# Patient Record
Sex: Male | Born: 1955
Health system: Southern US, Community
[De-identification: ages and names within clinical notes are randomized; demographics above are authoritative.]

## PROBLEM LIST (undated history)

## (undated) ENCOUNTER — Emergency Department: Payer: Medicare HMO

## (undated) DIAGNOSIS — E119 Type 2 diabetes mellitus without complications: Secondary | ICD-10-CM

## (undated) DIAGNOSIS — Z9889 Other specified postprocedural states: Secondary | ICD-10-CM

## (undated) DIAGNOSIS — K219 Gastro-esophageal reflux disease without esophagitis: Secondary | ICD-10-CM

## (undated) DIAGNOSIS — M1990 Unspecified osteoarthritis, unspecified site: Secondary | ICD-10-CM

## (undated) DIAGNOSIS — N179 Acute kidney failure, unspecified: Secondary | ICD-10-CM

## (undated) DIAGNOSIS — I10 Essential (primary) hypertension: Secondary | ICD-10-CM

## (undated) DIAGNOSIS — Z8719 Personal history of other diseases of the digestive system: Secondary | ICD-10-CM

## (undated) DIAGNOSIS — E039 Hypothyroidism, unspecified: Secondary | ICD-10-CM

## (undated) DIAGNOSIS — K5790 Diverticulosis of intestine, part unspecified, without perforation or abscess without bleeding: Secondary | ICD-10-CM

## (undated) DIAGNOSIS — Z794 Long term (current) use of insulin: Secondary | ICD-10-CM

## (undated) DIAGNOSIS — M109 Gout, unspecified: Secondary | ICD-10-CM

## (undated) HISTORY — DX: Unspecified osteoarthritis, unspecified site: M19.90

## (undated) HISTORY — PX: HERNIA REPAIR: SHX51

## (undated) HISTORY — PX: UMBILICAL HERNIA REPAIR: SHX196

---

## 2003-02-19 ENCOUNTER — Other Ambulatory Visit: Payer: Self-pay

## 2004-05-22 ENCOUNTER — Emergency Department: Payer: Self-pay | Admitting: Emergency Medicine

## 2004-06-06 ENCOUNTER — Ambulatory Visit: Payer: Self-pay | Admitting: Urology

## 2004-06-20 ENCOUNTER — Ambulatory Visit: Payer: Self-pay | Admitting: Urology

## 2004-06-21 ENCOUNTER — Ambulatory Visit: Payer: Self-pay | Admitting: Urology

## 2005-07-16 ENCOUNTER — Emergency Department: Payer: Self-pay | Admitting: Emergency Medicine

## 2006-04-29 ENCOUNTER — Emergency Department: Payer: Self-pay | Admitting: Emergency Medicine

## 2007-03-26 ENCOUNTER — Other Ambulatory Visit: Payer: Self-pay

## 2007-03-26 ENCOUNTER — Inpatient Hospital Stay: Payer: Self-pay | Admitting: Internal Medicine

## 2007-09-01 ENCOUNTER — Ambulatory Visit: Payer: Self-pay | Admitting: Internal Medicine

## 2007-10-26 ENCOUNTER — Emergency Department: Payer: Self-pay | Admitting: Emergency Medicine

## 2008-08-04 ENCOUNTER — Emergency Department: Payer: Self-pay | Admitting: Emergency Medicine

## 2009-02-20 ENCOUNTER — Emergency Department: Payer: Self-pay | Admitting: Emergency Medicine

## 2009-05-15 ENCOUNTER — Emergency Department: Payer: Self-pay | Admitting: Emergency Medicine

## 2009-06-22 ENCOUNTER — Emergency Department: Payer: Self-pay | Admitting: Emergency Medicine

## 2009-07-19 ENCOUNTER — Ambulatory Visit: Payer: Self-pay | Admitting: Unknown Physician Specialty

## 2010-07-22 IMAGING — CT CT ABD-PELV W/O CM
1 of 2 series · 15 of 32 positions shown, 19 images · non-contrast
Comparison: 04/29/2006

REASON FOR EXAM: (1) pain and vomiting; (2) pain and vomiting
COMMENTS:   May transport without cardiac monitor

PROCEDURE:     CT  - CT ABDOMEN AND PELVIS W[DATE]  [DATE]
RESULT:     Indication: Pain and vomiting
TECHNIQUE: Multiple axial images from the lung bases to the symphysis pubis
were obtained without oral and without intravenous contrast.

[Series 2: stone · axial · 0.87mm/px · z∈[-532,-73]mm · 15 of 167 slices shown, 19 images]
[im 7/167  soft-tissue]
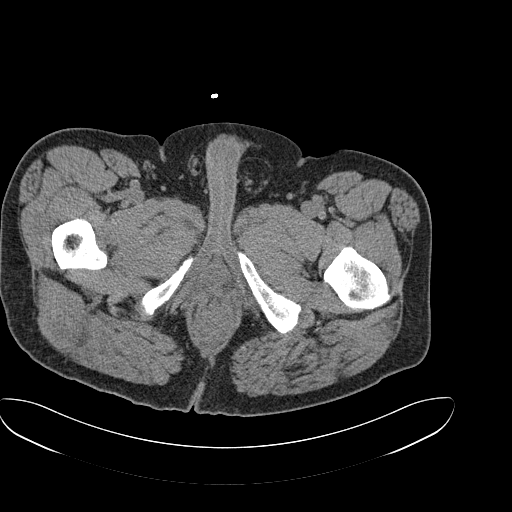
[im 7/167  bone]
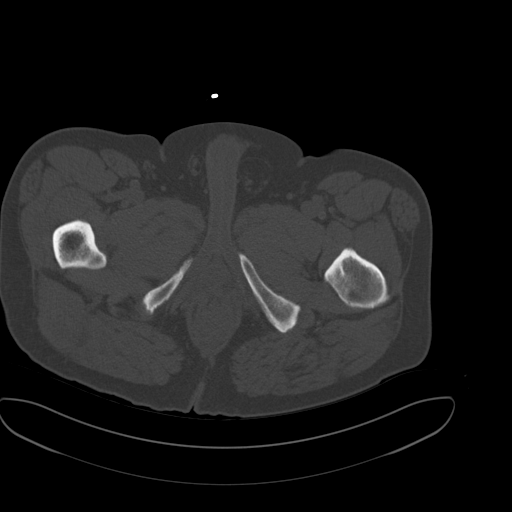
[im 21/167  soft-tissue]
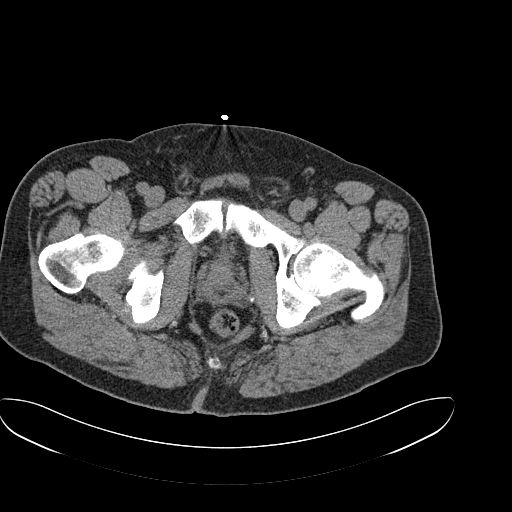
[im 35/167  soft-tissue]
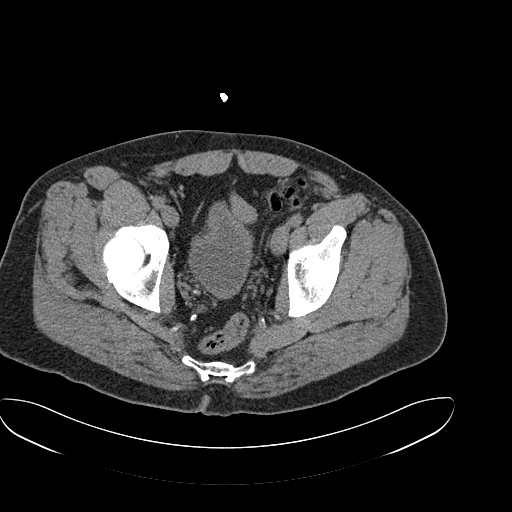
[im 49/167  soft-tissue]
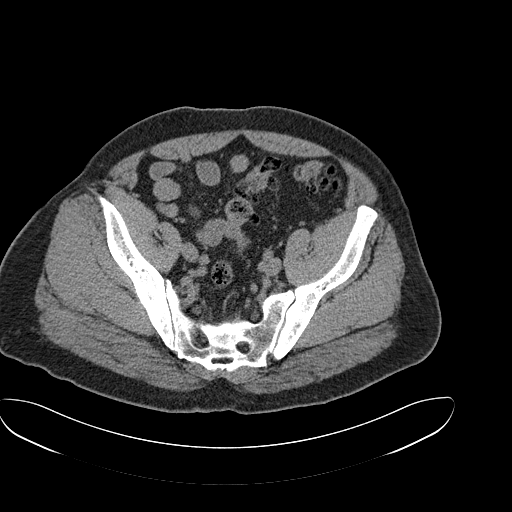
[im 56/167  soft-tissue]
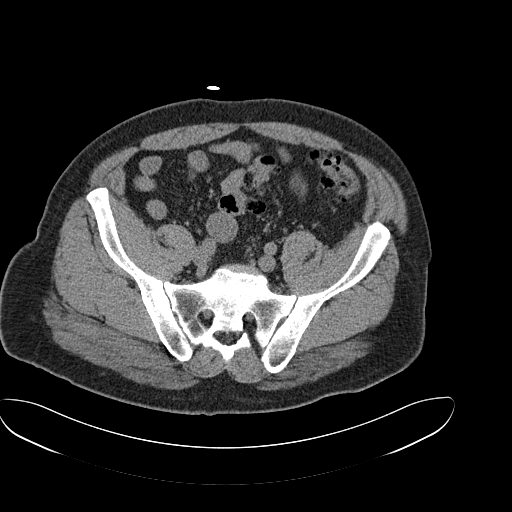
[im 70/167  soft-tissue]
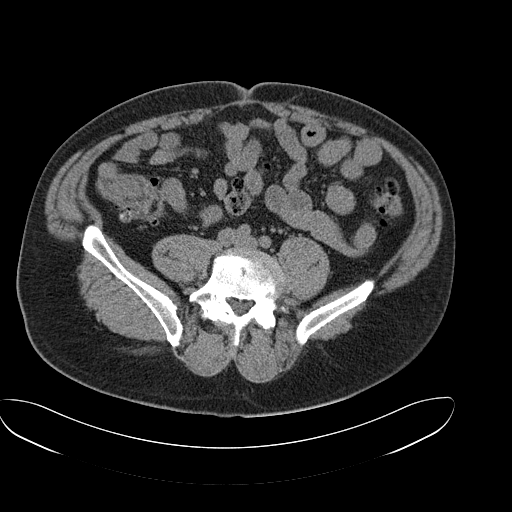
[im 84/167  soft-tissue]
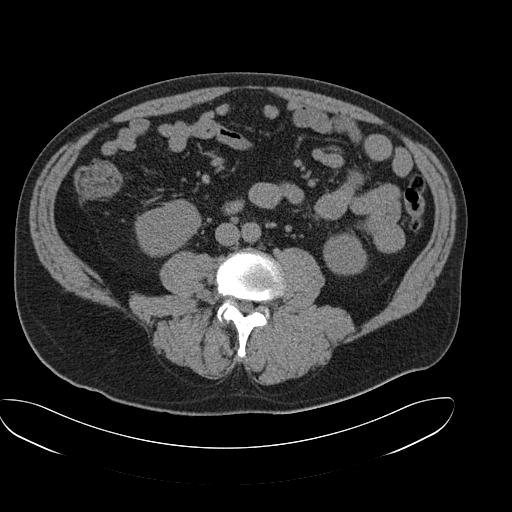
[im 97/167  soft-tissue]
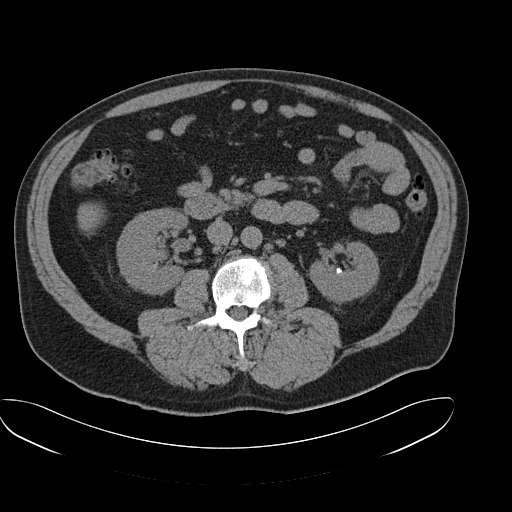
[im 111/167  soft-tissue]
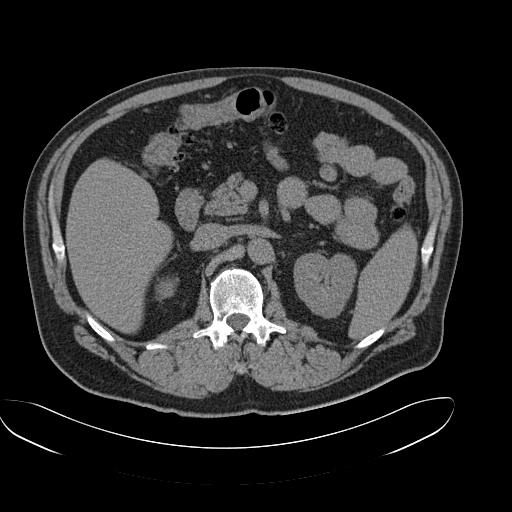
[im 111/167  bone]
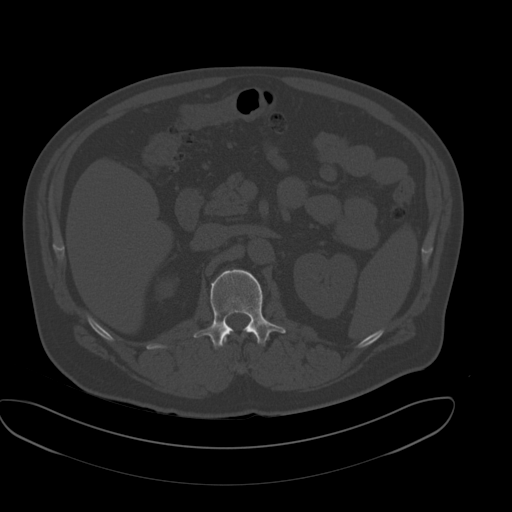
[im 118/167  soft-tissue]
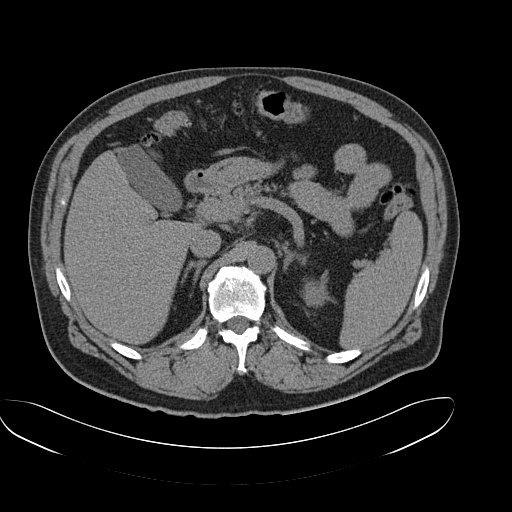
[im 132/167  soft-tissue]
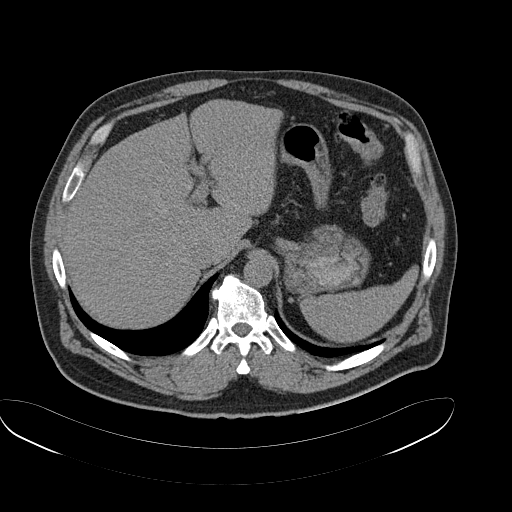
[im 139/167  lung]
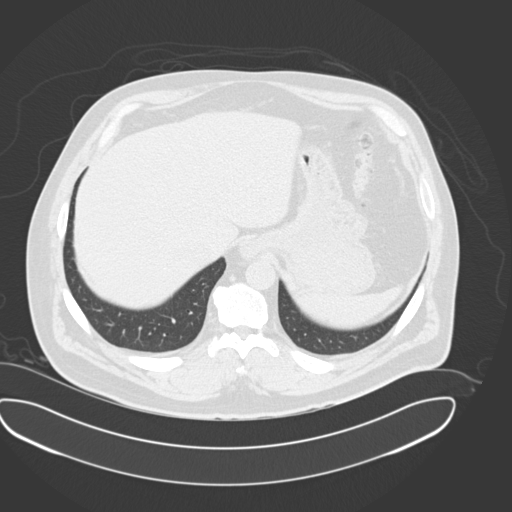
[im 146/167  soft-tissue]
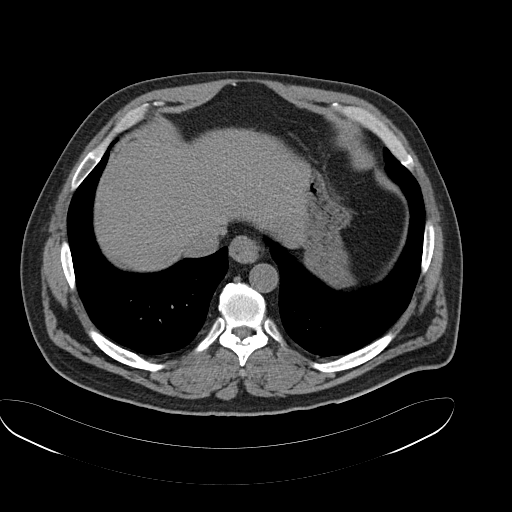
[im 146/167  lung]
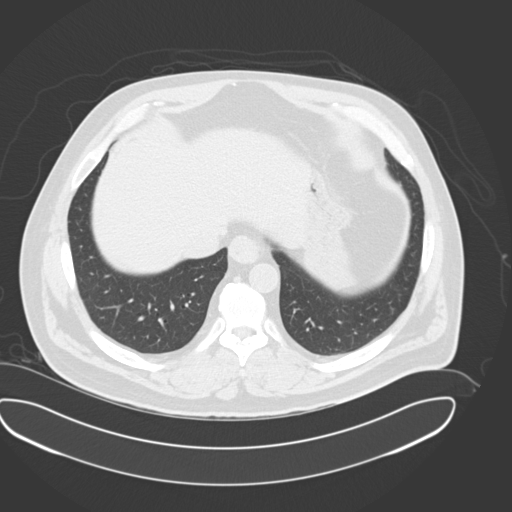
[im 153/167  lung]
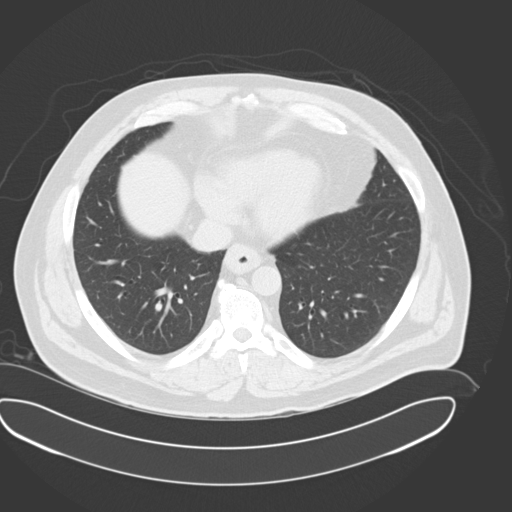
[im 160/167  soft-tissue]
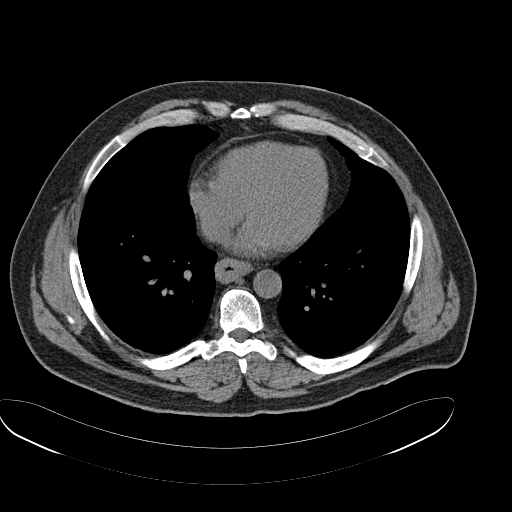
[im 160/167  lung]
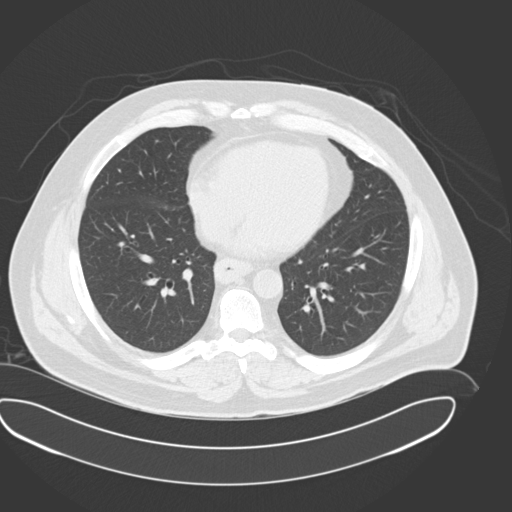

[15 of 32 positions shown; findings below may reference images not displayed]

FINDINGS: The lung bases are clear. There is no pleural or pericardial effusions.

There is a 4 mm nonobstructing left renal calculus. There is a punctate
nonobstructing left renal inferior pole calculus. No obstructive uropathy.
No perinephric stranding is seen. The kidneys are symmetric in size without
evidence for exophytic mass. The bladder is unremarkable.

The liver demonstrates no focal abnormality. The gallbladder is
unremarkable. The spleen demonstrates no focal abnormality. The adrenal
glands and pancreas are normal.

The unopacified stomach, duodenum, small intestine, and large intestine are
unremarkable, but evaluation is limited by lack of oral contrast. A there is
a fat-containing left inguinal hernia. There is diverticulosis without
evidence of diverticulitis. There is a normal caliber appendix in the right
lower quadrant without periappendiceal inflammatory changes. There is no
pneumoperitoneum, pneumatosis, or portal venous gas. There is no abdominal
or pelvic free fluid. There is no lymphadenopathy.

The abdominal aorta is normal in caliber. Incidental note is made of a
circumaortic left renal vein.

There is lumbar spine spondylosis.
IMPRESSION: 1. Nonobstructing left nephrolithiasis.
2. Normal appendix.
3. Diverticulosis without evidence of diverticulitis.

## 2011-01-09 ENCOUNTER — Emergency Department: Payer: Self-pay | Admitting: Emergency Medicine

## 2011-04-29 ENCOUNTER — Ambulatory Visit: Payer: Self-pay | Admitting: Internal Medicine

## 2011-05-06 ENCOUNTER — Ambulatory Visit: Payer: Self-pay | Admitting: Urology

## 2011-05-27 ENCOUNTER — Emergency Department: Payer: Self-pay | Admitting: Emergency Medicine

## 2012-03-13 ENCOUNTER — Ambulatory Visit: Payer: Self-pay | Admitting: Family Medicine

## 2013-09-08 ENCOUNTER — Emergency Department: Payer: Self-pay | Admitting: Emergency Medicine

## 2014-03-21 ENCOUNTER — Emergency Department: Payer: Self-pay | Admitting: Emergency Medicine

## 2014-07-19 ENCOUNTER — Encounter: Payer: Self-pay | Admitting: Urgent Care

## 2014-07-19 ENCOUNTER — Emergency Department
Admission: EM | Admit: 2014-07-19 | Discharge: 2014-07-19 | Disposition: A | Discharge status: Home / Self Care | Payer: BLUE CROSS/BLUE SHIELD | Attending: Emergency Medicine | Admitting: Emergency Medicine

## 2014-07-19 ENCOUNTER — Emergency Department: Payer: BLUE CROSS/BLUE SHIELD

## 2014-07-19 ENCOUNTER — Other Ambulatory Visit: Payer: Self-pay

## 2014-07-19 DIAGNOSIS — E119 Type 2 diabetes mellitus without complications: Secondary | ICD-10-CM | POA: Insufficient documentation

## 2014-07-19 DIAGNOSIS — Z791 Long term (current) use of non-steroidal anti-inflammatories (NSAID): Secondary | ICD-10-CM | POA: Diagnosis not present

## 2014-07-19 DIAGNOSIS — R079 Chest pain, unspecified: Secondary | ICD-10-CM | POA: Diagnosis not present

## 2014-07-19 DIAGNOSIS — Z79899 Other long term (current) drug therapy: Secondary | ICD-10-CM | POA: Insufficient documentation

## 2014-07-19 DIAGNOSIS — I10 Essential (primary) hypertension: Secondary | ICD-10-CM | POA: Diagnosis not present

## 2014-07-19 HISTORY — DX: Essential (primary) hypertension: I10

## 2014-07-19 HISTORY — DX: Type 2 diabetes mellitus without complications: E11.9

## 2014-07-19 LAB — BASIC METABOLIC PANEL
Anion gap: 9 (ref 5–15)
BUN: 18 mg/dL (ref 6–20)
CHLORIDE: 102 mmol/L (ref 101–111)
CO2: 31 mmol/L (ref 22–32)
Calcium: 9.4 mg/dL (ref 8.9–10.3)
Creatinine, Ser: 1.44 mg/dL — ABNORMAL HIGH (ref 0.61–1.24)
GFR calc non Af Amer: 52 mL/min — ABNORMAL LOW (ref >60)
GFR, EST AFRICAN AMERICAN: 60 mL/min — AB (ref >60)
Glucose, Bld: 179 mg/dL — ABNORMAL HIGH (ref 65–99)
POTASSIUM: 4.9 mmol/L (ref 3.5–5.1)
Sodium: 142 mmol/L (ref 135–145)

## 2014-07-19 LAB — CBC
HEMATOCRIT: 47 % (ref 40.0–52.0)
HEMOGLOBIN: 15.8 g/dL (ref 13.0–18.0)
MCH: 29.5 pg (ref 26.0–34.0)
MCHC: 33.6 g/dL (ref 32.0–36.0)
MCV: 87.7 fL (ref 80.0–100.0)
Platelets: 159 10*3/uL (ref 150–440)
RBC: 5.36 MIL/uL (ref 4.40–5.90)
RDW: 13.9 % (ref 11.5–14.5)
WBC: 7 10*3/uL (ref 3.8–10.6)

## 2014-07-19 LAB — TROPONIN I: Troponin I: <0.03 ng/mL (ref <0.031)

## 2014-07-19 LAB — FIBRIN DERIVATIVES D-DIMER (ARMC ONLY): Fibrin derivatives D-dimer (ARMC): 341 (ref 0–499)

## 2014-07-19 MED ORDER — NAPROXEN 500 MG PO TABS
ORAL_TABLET | ORAL | Status: AC
  Administered 2014-07-19: 250 mg via ORAL
  Filled 2014-07-19: qty 1

## 2014-07-19 MED ORDER — NAPROXEN 500 MG PO TABS
250.0000 mg | ORAL_TABLET | Freq: Once | ORAL | Status: AC
  Administered 2014-07-19: 250 mg via ORAL

## 2014-07-19 NOTE — Discharge Instructions (Signed)

## 2014-07-19 NOTE — ED Notes (Signed)
Patient presents with c/o LEFT sided CP with (+) SOB. Pain is exacerbated by deep inspiration and movement. Symptoms persistent since yesterday. Denies known injury.

## 2014-07-19 NOTE — ED Provider Notes (Signed)
[facility] Emergency Department Provider Note  ____________________________________________  Time seen: Approximately 850 AM  I have reviewed the triage vital signs and the nursing notes.   HISTORY  Chief Complaint Chest Pain    HPI Nathan Wong is a 59 y.o. male with a history of hypertension and diabetes who presents today with 1 day of chest pain. He says that he does a lot of lifting and bending at work and it started about an hour after he was doing some stretching and lifting of his upper extremities. The pain is a 4 out of 10 right now and has been intermittent over the past day. Cramping tightness in quality. He says that bending and moving upper extremities makes it worse. It is not associated with shortness of breath but hurts to take a deep breath.Says any diaphoresis, nausea vomiting. Does not worsen with exertion. Has not taken any pain relief medications for this. Pain is to the left lower part of the chest and nonradiating.   Past Medical History  Diagnosis Date  . Hypertension   . Diabetes mellitus without complication     There are no active problems to display for this patient.   History reviewed. No pertinent past surgical history.  Current Outpatient Rx  Name  Route  Sig  Dispense  Refill  . gabapentin (NEURONTIN) 300 MG capsule   Oral   Take 300 mg by mouth 3 (three) times daily.         Marland Kitchen glyBURIDE micronized (GLYNASE) 3 MG tablet   Oral   Take 3 mg by mouth daily with breakfast.       2   . indomethacin (INDOCIN) 25 MG capsule   Oral   Take 25 mg by mouth 2 (two) times daily with a meal.       1   . lisinopril (PRINIVIL,ZESTRIL) 5 MG tablet   Oral   Take 5 mg by mouth daily.       4   . metFORMIN (GLUCOPHAGE) 500 MG tablet   Oral   Take 1,000 mg by mouth daily with breakfast.       2     Allergies Review of patient's allergies indicates no known allergies.  No family history on file.  Social  History History  Substance Use Topics  . Smoking status: Never Smoker   . Smokeless tobacco: Not on file  . Alcohol Use: No    Review of Systems Constitutional: No fever/chills Eyes: No visual changes. ENT: No sore throat. Cardiovascular: As above Respiratory: Denies shortness of breath. Gastrointestinal: No abdominal pain.  No nausea, no vomiting.  No diarrhea.  No constipation. Genitourinary: Negative for dysuria. Musculoskeletal: Negative for back pain. Skin: Negative for rash. Neurological: Negative for headaches, focal weakness or numbness.  10-point ROS otherwise negative.  ____________________________________________   PHYSICAL EXAM:  VITAL SIGNS: ED Triage Vitals  Enc Vitals Group     BP 07/19/14 0704 133/79 mmHg     Pulse Rate 07/19/14 0704 74     Resp 07/19/14 0704 18     Temp 07/19/14 0704 98.1 F (36.7 C)     Temp Source 07/19/14 0704 Oral     SpO2 07/19/14 0704 97 %     Weight --      Height --      Head Cir --      Peak Flow --      Pain Score 07/19/14 0702 4     Pain Loc --  Pain Edu? --      Excl. in GC? --     Constitutional: Alert and oriented. Well appearing and in no acute distress. Eyes: Conjunctivae are normal. PERRL. EOMI. Head: Atraumatic. Nose: No congestion/rhinnorhea. Mouth/Throat: Mucous membranes are moist.  Oropharynx non-erythematous. Neck: No stridor.   Cardiovascular: Normal rate, regular rhythm. Grossly normal heart sounds.  Good peripheral circulation. Chest pain reproducible the left lower part of the chest over ribs 9 through 11 both anteriorly and laterally. No left upper quadrant pain to the abdomen. No Cullins or Ven[place] Lick signs. Respiratory: Normal respiratory effort.  No retractions. Lungs CTAB. Gastrointestinal: Soft and nontender. No distention. No abdominal bruits. No CVA tenderness. Musculoskeletal: No lower extremity tenderness nor edema.  No joint effusions. Neurologic:  Normal speech and language. No gross  focal neurologic deficits are appreciated. Speech is normal. No gait instability. Skin:  Skin is warm, dry and intact. No rash noted. Psychiatric: Mood and affect are normal. Speech and behavior are normal.  ____________________________________________   LABS (all labs ordered are listed, but only abnormal results are displayed)  Labs Reviewed  BASIC METABOLIC PANEL - Abnormal; Notable for the following:    Glucose, Bld 179 (*)    Creatinine, Ser 1.44 (*)    GFR calc non Af Amer 52 (*)    GFR calc Af Amer 60 (*)    All other components within normal limits  CBC  TROPONIN I  FIBRIN DERIVATIVES D-DIMER ([facility] ONLY)   ____________________________________________  EKG  ED ECG REPORT I, Arelia Longest, the attending physician, personally viewed and interpreted this ECG.   Date: 07/19/2014  EKG Time: 706  Rate: 75  Rhythm: normal EKG, normal sinus rhythm  Axis: Normal axis  Intervals:none  ST&T Change: No ST elevations or depressions. No abnormal T-wave inversions  ____________________________________________  RADIOLOGY  Chest x-ray NAD. ____________________________________________   PROCEDURES FAST BEDSIDE US Indication: Rib pain to the left lower part of the thorax  3 Views obtained: Splenorenal, Morrison's Pouch, Retrovesical No free fluid in abdomen.  Spleen visualized and appears intact. No difficulty obtaining views. I personally performed and interrepreted the images   ____________________________________________   INITIAL IMPRESSION / ASSESSMENT AND PLAN / ED COURSE  Pertinent labs & imaging results that were available during my care of the patient were reviewed by me and considered in my medical decision making (see chart for details).  ----------------------------------------- 10:39 AM on 07/19/2014 -----------------------------------------  Pain improved after Naprosyn. Likely chest wall pain associated with work. Labs, imaging and EKG are all  reassuring. We'll discharge to home. will continue with symptomatically treatment of Naprosyn and muscle cream such as Aspercreme or acting. ____________________________________________   FINAL CLINICAL IMPRESSION(S) / ED DIAGNOSES  Acute chest pain. Initial visit.      Myrna Blazer, MD 07/19/14 1040

## 2014-07-19 NOTE — ED Notes (Signed)
MD at bedside. 

## 2015-06-22 ENCOUNTER — Other Ambulatory Visit: Payer: Self-pay | Admitting: Internal Medicine

## 2015-06-22 DIAGNOSIS — G459 Transient cerebral ischemic attack, unspecified: Secondary | ICD-10-CM

## 2015-06-22 DIAGNOSIS — H547 Unspecified visual loss: Secondary | ICD-10-CM

## 2015-07-13 ENCOUNTER — Ambulatory Visit: Payer: BLUE CROSS/BLUE SHIELD

## 2016-01-04 ENCOUNTER — Emergency Department: Payer: Self-pay

## 2016-01-04 ENCOUNTER — Observation Stay
Admit: 2016-01-04 | Discharge: 2016-01-04 | Disposition: A | Discharge status: Home / Self Care | Payer: Self-pay | Attending: Cardiovascular Disease | Admitting: Cardiovascular Disease

## 2016-01-04 ENCOUNTER — Observation Stay
Admission: EM | Admit: 2016-01-04 | Discharge: 2016-01-05 | Disposition: A | Discharge status: Home / Self Care | Payer: Self-pay | Attending: Internal Medicine | Admitting: Internal Medicine

## 2016-01-04 DIAGNOSIS — R0781 Pleurodynia: Principal | ICD-10-CM | POA: Insufficient documentation

## 2016-01-04 DIAGNOSIS — Z79899 Other long term (current) drug therapy: Secondary | ICD-10-CM | POA: Insufficient documentation

## 2016-01-04 DIAGNOSIS — E1122 Type 2 diabetes mellitus with diabetic chronic kidney disease: Secondary | ICD-10-CM | POA: Insufficient documentation

## 2016-01-04 DIAGNOSIS — I313 Pericardial effusion (noninflammatory): Secondary | ICD-10-CM | POA: Insufficient documentation

## 2016-01-04 DIAGNOSIS — R1011 Right upper quadrant pain: Secondary | ICD-10-CM

## 2016-01-04 DIAGNOSIS — I129 Hypertensive chronic kidney disease with stage 1 through stage 4 chronic kidney disease, or unspecified chronic kidney disease: Secondary | ICD-10-CM | POA: Insufficient documentation

## 2016-01-04 DIAGNOSIS — N183 Chronic kidney disease, stage 3 (moderate): Secondary | ICD-10-CM | POA: Insufficient documentation

## 2016-01-04 DIAGNOSIS — E1140 Type 2 diabetes mellitus with diabetic neuropathy, unspecified: Secondary | ICD-10-CM | POA: Insufficient documentation

## 2016-01-04 DIAGNOSIS — Q248 Other specified congenital malformations of heart: Secondary | ICD-10-CM

## 2016-01-04 DIAGNOSIS — Z7984 Long term (current) use of oral hypoglycemic drugs: Secondary | ICD-10-CM | POA: Insufficient documentation

## 2016-01-04 HISTORY — DX: Other specified postprocedural states: Z98.890

## 2016-01-04 HISTORY — DX: Personal history of other diseases of the digestive system: Z87.19

## 2016-01-04 LAB — CBC
HEMATOCRIT: 43.1 % (ref 40.0–52.0)
Hemoglobin: 15.2 g/dL (ref 13.0–18.0)
MCH: 29.9 pg (ref 26.0–34.0)
MCHC: 35.3 g/dL (ref 32.0–36.0)
MCV: 84.8 fL (ref 80.0–100.0)
PLATELETS: 159 10*3/uL (ref 150–440)
RBC: 5.08 MIL/uL (ref 4.40–5.90)
RDW: 14.2 % (ref 11.5–14.5)
WBC: 12.9 10*3/uL — AB (ref 3.8–10.6)

## 2016-01-04 LAB — TROPONIN I: Troponin I: <0.03 ng/mL (ref <0.03)

## 2016-01-04 LAB — FIBRIN DERIVATIVES D-DIMER (ARMC ONLY): FIBRIN DERIVATIVES D-DIMER (ARMC): 350 (ref 0–499)

## 2016-01-04 LAB — BASIC METABOLIC PANEL
Anion gap: 10 (ref 5–15)
BUN: 14 mg/dL (ref 6–20)
CALCIUM: 8.9 mg/dL (ref 8.9–10.3)
CO2: 25 mmol/L (ref 22–32)
CREATININE: 1.33 mg/dL — AB (ref 0.61–1.24)
Chloride: 100 mmol/L — ABNORMAL LOW (ref 101–111)
GFR calc Af Amer: >60 mL/min (ref >60)
GFR, EST NON AFRICAN AMERICAN: 56 mL/min — AB (ref >60)
Glucose, Bld: 284 mg/dL — ABNORMAL HIGH (ref 65–99)
Potassium: 4.2 mmol/L (ref 3.5–5.1)
SODIUM: 135 mmol/L (ref 135–145)

## 2016-01-04 LAB — TSH: TSH: 8.297 u[IU]/mL — ABNORMAL HIGH (ref 0.350–4.500)

## 2016-01-04 LAB — GLUCOSE, CAPILLARY
Glucose-Capillary: 213 mg/dL — ABNORMAL HIGH (ref 65–99)
Glucose-Capillary: 196 mg/dL — ABNORMAL HIGH (ref 65–99)
Glucose-Capillary: 170 mg/dL — ABNORMAL HIGH (ref 65–99)
Glucose-Capillary: 233 mg/dL — ABNORMAL HIGH (ref 65–99)

## 2016-01-04 MED ORDER — MORPHINE SULFATE (PF) 4 MG/ML IV SOLN
2.0000 mg | Freq: Once | INTRAVENOUS | Status: AC
  Administered 2016-01-04: 2 mg via INTRAVENOUS

## 2016-01-04 MED ORDER — ACETAMINOPHEN 650 MG RE SUPP: 650.0000 mg | Freq: Four times a day (QID) | RECTAL | Status: DC | PRN

## 2016-01-04 MED ORDER — GABAPENTIN 300 MG PO CAPS
300.0000 mg | ORAL_CAPSULE | Freq: Every day | ORAL | Status: DC
  Administered 2016-01-05: 300 mg via ORAL
  Filled 2016-01-04: qty 1

## 2016-01-04 MED ORDER — GI COCKTAIL ~~LOC~~
30.0000 mL | Freq: Once | ORAL | Status: AC
  Administered 2016-01-04: 30 mL via ORAL

## 2016-01-04 MED ORDER — TRAMADOL HCL 50 MG PO TABS
50.0000 mg | ORAL_TABLET | Freq: Four times a day (QID) | ORAL | Status: DC | PRN
Start: 2016-01-04 — End: 2016-01-05
  Administered 2016-01-04: 50 mg via ORAL
  Filled 2016-01-04: qty 1

## 2016-01-04 MED ORDER — LISINOPRIL 5 MG PO TABS
5.0000 mg | ORAL_TABLET | Freq: Every day | ORAL | Status: DC
  Administered 2016-01-04 – 2016-01-05 (×2): 5 mg via ORAL
  Filled 2016-01-04 (×2): qty 1

## 2016-01-04 MED ORDER — ACETAMINOPHEN 325 MG PO TABS
650.0000 mg | ORAL_TABLET | Freq: Four times a day (QID) | ORAL | Status: DC | PRN
  Filled 2016-01-04: qty 2

## 2016-01-04 MED ORDER — INSULIN ASPART 100 UNIT/ML ~~LOC~~ SOLN
0.0000 [IU] | Freq: Three times a day (TID) | SUBCUTANEOUS | Status: DC
  Administered 2016-01-04 – 2016-01-05 (×3): 2 [IU] via SUBCUTANEOUS
  Filled 2016-01-04 (×3): qty 2

## 2016-01-04 MED ORDER — DOCUSATE SODIUM 100 MG PO CAPS
100.0000 mg | ORAL_CAPSULE | Freq: Two times a day (BID) | ORAL | Status: DC
  Administered 2016-01-04 – 2016-01-05 (×2): 100 mg via ORAL
  Filled 2016-01-04 (×2): qty 1

## 2016-01-04 MED ORDER — ONDANSETRON HCL 4 MG/2ML IJ SOLN
INTRAMUSCULAR | Status: AC
  Administered 2016-01-04: 4 mg via INTRAVENOUS
  Filled 2016-01-04: qty 2

## 2016-01-04 MED ORDER — MORPHINE SULFATE (PF) 2 MG/ML IV SOLN
INTRAVENOUS | Status: AC
  Filled 2016-01-04: qty 1

## 2016-01-04 MED ORDER — GI COCKTAIL ~~LOC~~
ORAL | Status: AC
  Filled 2016-01-04: qty 30

## 2016-01-04 MED ORDER — MORPHINE SULFATE (PF) 4 MG/ML IV SOLN: 2.0000 mg | Freq: Once | INTRAVENOUS | Status: DC

## 2016-01-04 MED ORDER — ENOXAPARIN SODIUM 40 MG/0.4ML ~~LOC~~ SOLN
40.0000 mg | SUBCUTANEOUS | Status: DC
  Administered 2016-01-04: 40 mg via SUBCUTANEOUS
  Filled 2016-01-04: qty 0.4

## 2016-01-04 MED ORDER — METFORMIN HCL 500 MG PO TABS
1000.0000 mg | ORAL_TABLET | Freq: Every day | ORAL | Status: DC
  Filled 2016-01-04: qty 2

## 2016-01-04 MED ORDER — INSULIN ASPART 100 UNIT/ML ~~LOC~~ SOLN
0.0000 [IU] | Freq: Every day | SUBCUTANEOUS | Status: DC
  Administered 2016-01-04: 2 [IU] via SUBCUTANEOUS
  Filled 2016-01-04: qty 2

## 2016-01-04 MED ORDER — ASPIRIN 81 MG PO CHEW
CHEWABLE_TABLET | ORAL | Status: AC
  Administered 2016-01-04: 324 mg via ORAL
  Filled 2016-01-04: qty 4

## 2016-01-04 MED ORDER — ONDANSETRON HCL 4 MG/2ML IJ SOLN: 4.0000 mg | Freq: Four times a day (QID) | INTRAMUSCULAR | Status: DC | PRN

## 2016-01-04 MED ORDER — IBUPROFEN 400 MG PO TABS: 400.0000 mg | ORAL_TABLET | Freq: Four times a day (QID) | ORAL | Status: DC | PRN

## 2016-01-04 MED ORDER — SODIUM CHLORIDE 0.9 % IV SOLN
INTRAVENOUS | Status: DC
  Administered 2016-01-04: 10:00:00 via INTRAVENOUS

## 2016-01-04 MED ORDER — SODIUM CHLORIDE 0.9% FLUSH
3.0000 mL | Freq: Two times a day (BID) | INTRAVENOUS | Status: DC
Start: 2016-01-04 — End: 2016-01-05
  Administered 2016-01-04 – 2016-01-05 (×3): 3 mL via INTRAVENOUS

## 2016-01-04 MED ORDER — GABAPENTIN 300 MG PO CAPS
300.0000 mg | ORAL_CAPSULE | Freq: Three times a day (TID) | ORAL | Status: DC
  Administered 2016-01-04: 300 mg via ORAL
  Filled 2016-01-04: qty 1

## 2016-01-04 MED ORDER — ONDANSETRON HCL 4 MG PO TABS: 4.0000 mg | ORAL_TABLET | Freq: Four times a day (QID) | ORAL | Status: DC | PRN

## 2016-01-04 MED ORDER — ONDANSETRON HCL 4 MG/2ML IJ SOLN
4.0000 mg | Freq: Once | INTRAMUSCULAR | Status: AC
  Administered 2016-01-04: 4 mg via INTRAVENOUS

## 2016-01-04 MED ORDER — IOPAMIDOL (ISOVUE-370) INJECTION 76%
75.0000 mL | Freq: Once | INTRAVENOUS | Status: AC | PRN
  Administered 2016-01-04: 75 mL via INTRAVENOUS

## 2016-01-04 MED ORDER — ASPIRIN 81 MG PO CHEW
324.0000 mg | CHEWABLE_TABLET | Freq: Once | ORAL | Status: AC
  Administered 2016-01-04: 324 mg via ORAL

## 2016-01-04 NOTE — Progress Notes (Signed)
Patient is alert and oriented, vss, having some intermittent chest pain that is relieved with tramadol.    Awaiting echo .

## 2016-01-04 NOTE — Progress Notes (Signed)
*  PRELIMINARY RESULTS* Echocardiogram 2D Echocardiogram has been performed.  Cristela BlueHege, Safiyya Stokes 01/04/2016, 5:03 PM

## 2016-01-04 NOTE — H&P (Signed)
Nathan Wong is an 60 y.o. male.   Chief Complaint: Chest pain HPI: The patient with past medical history of diabetes and hypertension presents to the emergency department complaining of chest pain. The patient states that began yesterday afternoon and is positional. Leaning forward or laying flat hurts worse than sitting upright. He denies shortness of breath, nausea, vomiting or diaphoresis. In the emergency department he was found to be tachycardic but in no apparent distress. Initial cardiac enzymes were negative. CT of the chest revealed a pericardial cyst. Due to his ongoing chest pain and the potential rapid growth of the cyst emergency department staff called the hospitalist service for further evaluation.  Past Medical History:  Diagnosis Date  . Diabetes mellitus without complication ([place])   . Hypertension     Past Surgical History:  Procedure Laterality Date  . HERNIA REPAIR     abdominal    Family History  Problem Relation Age of Onset  . CAD Mother    Social History:  reports that he has never smoked. He does not have any smokeless tobacco history on file. He reports that he does not drink alcohol or use drugs.  Allergies: No Known Allergies  Prior to Admission medications   Medication Sig Start Date End Date Taking? Authorizing Provider  gabapentin (NEURONTIN) 300 MG capsule Take 300 mg by mouth 3 (three) times daily.    Historical Provider, MD  glyBURIDE micronized (GLYNASE) 3 MG tablet Take 3 mg by mouth daily with breakfast.  06/15/14   Historical Provider, MD  indomethacin (INDOCIN) 25 MG capsule Take 25 mg by mouth 2 (two) times daily with a meal.  07/08/14   Historical Provider, MD  lisinopril (PRINIVIL,ZESTRIL) 5 MG tablet Take 5 mg by mouth daily.  07/08/14   Historical Provider, MD  metFORMIN (GLUCOPHAGE) 500 MG tablet Take 1,000 mg by mouth daily with breakfast.  06/24/14   Historical Provider, MD     Results for orders placed or performed during the hospital  encounter of 01/04/16 (from the past 48 hour(s))  Basic metabolic panel     Status: Abnormal   Collection Time: 01/04/16  2:40 AM  Result Value Ref Range   Sodium 135 135 - 145 mmol/L   Potassium 4.2 3.5 - 5.1 mmol/L   Chloride 100 (L) 101 - 111 mmol/L   CO2 25 22 - 32 mmol/L   Glucose, Bld 284 (H) 65 - 99 mg/dL   BUN 14 6 - 20 mg/dL   Creatinine, Ser 1.33 (H) 0.61 - 1.24 mg/dL   Calcium 8.9 8.9 - 10.3 mg/dL   GFR calc non Af Amer 56 (L) >60 mL/min   GFR calc Af Amer >60 >60 mL/min    Comment: (NOTE) The eGFR has been calculated using the CKD EPI equation. This calculation has not been validated in all clinical situations. eGFR's persistently <60 mL/min signify possible Chronic Kidney Disease.    Anion gap 10 5 - 15  CBC     Status: Abnormal   Collection Time: 01/04/16  2:40 AM  Result Value Ref Range   WBC 12.9 (H) 3.8 - 10.6 K/uL   RBC 5.08 4.40 - 5.90 MIL/uL   Hemoglobin 15.2 13.0 - 18.0 g/dL   HCT 43.1 40.0 - 52.0 %   MCV 84.8 80.0 - 100.0 fL   MCH 29.9 26.0 - 34.0 pg   MCHC 35.3 32.0 - 36.0 g/dL   RDW 14.2 11.5 - 14.5 %   Platelets 159 150 - 440  K/uL  Troponin I     Status: None   Collection Time: 01/04/16  2:40 AM  Result Value Ref Range   Troponin I <0.03 <0.03 ng/mL  Fibrin derivatives D-Dimer     Status: None   Collection Time: 01/04/16  2:43 AM  Result Value Ref Range   Fibrin derivatives D-dimer ([facility]) 350 0 - 499    Comment: <> Exclusion of Venous Thromboembolism (VTE) - OUTPATIENTS ONLY        (Emergency Department or [place])             0-499 ng/ml (FEU)  : With a low to intermediate pretest                                        probability for VTE this test result                                        excludes the diagnosis of VTE.           > 499 ng/ml (FEU)  : VTE not excluded.  Additional work up                                   for VTE is required.   <>  Testing on Inpatients and Evaluation of Disseminated Intravascular        Coagulation  ([facility])             Reference Range:   0-499 ng/ml (FEU)    Ct Angio Chest Pe W And/or Wo Contrast  Result Date: 01/04/2016 CLINICAL DATA:  Acute onset of midsternal chest pain and shortness of breath. Initial encounter. EXAM: CT ANGIOGRAPHY CHEST WITH CONTRAST TECHNIQUE: Multidetector CT imaging of the chest was performed using the standard protocol during bolus administration of intravenous contrast. Multiplanar CT image reconstructions and MIPs were obtained to evaluate the vascular anatomy. CONTRAST:  75 mL of Isovue 370 IV contrast COMPARISON:  Chest radiograph performed earlier today at 2:51 a.m. FINDINGS: Cardiovascular:  There is no evidence of pulmonary embolus. The heart remains normal in size. The thoracic aorta is grossly unremarkable. No significant calcific atherosclerotic disease is seen. The great vessels are unremarkable. Mediastinum/Nodes: The mediastinum is unremarkable in appearance, aside from diffuse wall thickening along the mid to distal esophagus, concerning for acute esophagitis. No mediastinal lymphadenopathy is seen. Trace pericardial fluid is likely within normal limits. An apparent 5.3 x 1.6 cm pericardial cyst is noted on the right side. Lungs/Pleura: Patchy bibasilar atelectasis is noted. Mild interstitial prominence is seen. No pleural effusion or pneumothorax is identified. No masses are identified. Upper Abdomen: The visualized portions the liver and spleen are grossly unremarkable. The visualized portions of the gallbladder, pancreas and adrenal glands are within normal limits. Musculoskeletal: No acute osseous abnormalities are identified. The visualized musculature is unremarkable in appearance. Review of the MIP images confirms the above findings. IMPRESSION: 1. No evidence of pulmonary embolus. 2. Diffuse wall thickening along the mid to distal esophagus, concerning for acute esophagitis. 3. Apparent 5.3 x 1.6 cm pericardial cyst noted at the right side of the  mediastinum. 4. Patchy bibasilar atelectasis, with mild interstitial prominence. Lungs otherwise grossly clear. Electronically Signed   By: Garald Balding  M.D.   On: 01/04/2016 04:31   Dg Chest Portable 1 View  Result Date: 01/04/2016 CLINICAL DATA:  Acute onset of midsternal chest pain and shortness of breath. Initial encounter. EXAM: PORTABLE CHEST 1 VIEW COMPARISON:  Chest radiograph performed 07/19/2014 FINDINGS: The lungs are well-aerated. Mild bibasilar atelectasis is noted. There is no evidence of pleural effusion or pneumothorax. The cardiomediastinal silhouette is borderline normal in size. No acute osseous abnormalities are seen. IMPRESSION: Mild bibasilar atelectasis noted.  Lungs otherwise grossly clear. Electronically Signed   By: Garald Balding M.D.   On: 01/04/2016 03:05   US Abdomen Limited Ruq  Result Date: 01/04/2016 CLINICAL DATA:  Acute onset of right upper quadrant abdominal pain. Initial encounter. EXAM: US ABDOMEN LIMITED - RIGHT UPPER QUADRANT COMPARISON:  CT of the abdomen and pelvis from 04/29/2011 FINDINGS: Gallbladder: No gallstones or wall thickening visualized. No sonographic Murphy sign noted by sonographer. Common bile duct: Diameter: 0.4 cm, within normal limits in caliber. Liver: No focal lesion identified. Mildly increased parenchymal echogenicity raises concern for fatty infiltration. IMPRESSION: 1. No acute abnormality at the right upper quadrant. 2. Mild fatty infiltration within the liver. Electronically Signed   By: Garald Balding M.D.   On: 01/04/2016 03:52    Review of Systems  Constitutional: Negative for chills and fever.  HENT: Negative for sore throat and tinnitus.   Eyes: Negative for blurred vision and redness.  Respiratory: Negative for cough and shortness of breath.   Cardiovascular: Positive for chest pain. Negative for palpitations, orthopnea and PND.  Gastrointestinal: Negative for abdominal pain, diarrhea, nausea and vomiting.  Genitourinary:  Negative for dysuria, frequency and urgency.  Musculoskeletal: Negative for joint pain and myalgias.  Skin: Negative for rash.       No lesions  Neurological: Negative for speech change, focal weakness and weakness.  Endo/Heme/Allergies: Does not bruise/bleed easily.       No temperature intolerance  Psychiatric/Behavioral: Negative for depression and suicidal ideas.    Blood pressure 99/70, pulse (!) 116, temperature 98.2 F (36.8 C), temperature source Oral, resp. rate 16, height 6' (1.829 m), weight 106.6 kg (235 lb), SpO2 93 %. Physical Exam  Constitutional: He is oriented to person, place, and time. He appears well-developed and well-nourished. No distress.  HENT:  Head: Normocephalic and atraumatic.  Mouth/Throat: Oropharynx is clear and moist.  Eyes: Conjunctivae and EOM are normal. Pupils are equal, round, and reactive to light. No scleral icterus.  Neck: Normal range of motion. Neck supple. No JVD present. No tracheal deviation present. No thyromegaly present.  Cardiovascular: Normal rate, regular rhythm and normal heart sounds.  Exam reveals no gallop and no friction rub.   No murmur heard. Respiratory: Effort normal and breath sounds normal. No respiratory distress.  GI: Soft. Bowel sounds are normal. He exhibits no distension. There is no tenderness.  Genitourinary:  Genitourinary Comments: Deferred  Musculoskeletal: Normal range of motion. He exhibits no edema.  Lymphadenopathy:    He has no cervical adenopathy.  Neurological: He is alert and oriented to person, place, and time. No cranial nerve deficit.  Skin: Skin is warm and dry. No rash noted. No erythema.  Psychiatric: He has a normal mood and affect. His behavior is normal. Judgment and thought content normal.     Assessment/Plan This is a 60 year old male admitted for pericardial cyst. 1. Pericardial cyst: Etiology of positional chest pain. The patient does not have any indication of acute coronary syndrome.  Further, he does have risk factors for  heart disease. I will order a cardiology consult as well as an echocardiogram. He will not need a stress test at this time. 2. Diabetes mellitus type 2: Hold glyburide; may continue metformin for this short hospital stay as we do not anticipate any studies requiring contrast. I have added sliding scale insulin while the patient is hospitalized. 3. Hypertension: Controlled; continue lisinopril 4. CKD: Stage III; creatinine clearance is actually improved since previous laboratory data. May continue ACE inhibitor however I have discontinued the patient's indomethacin. 5. Neuropathy: Secondary to diabetes. Continue Neurontin 6. DVT prophylaxis: Lovenox 7. GI prophylaxis: None The patient is a full code. Time spent on admission was inpatient care approximately 45 minutes  Harrie Foreman, MD 01/04/2016, 6:31 AM

## 2016-01-04 NOTE — ED Provider Notes (Signed)
[facility] Emergency Department Provider Note   First MD Initiated Contact with Patient 01/04/16 [phone]     (approximate)  I have reviewed the triage vital signs and the nursing notes.   HISTORY  Chief Complaint Chest Pain   HPI Dominic PeaJames K Dauphinais is a 60 y.o. male with history of hypertension and diabetes presents to the emergency department with persistent 8 out of 10 central chest pain that is nonradiating with onset earlier this evening. Patient denies any dyspnea but does admit to nausea. Patient denies any diaphoresis. Patient denies any lower shoulder pain or swelling. Patient denies any dizziness or palpitations. Patient states that the pain is worse with laying flat and improved with sitting forward.   Past Medical History:  Diagnosis Date  . Diabetes mellitus without complication (HCC)   . H/O hernia repair   . Hypertension     Patient Active Problem List   Diagnosis Date Noted  . Pericardial cyst 01/04/2016    Past surgical history None  Prior to Admission medications   Medication Sig Start Date End Date Taking? Authorizing Provider  gabapentin (NEURONTIN) 300 MG capsule Take 300 mg by mouth daily.    Yes Historical Provider, MD  glyBURIDE micronized (GLYNASE) 3 MG tablet Take 3 mg by mouth daily with breakfast.  06/15/14  Yes Historical Provider, MD  lisinopril (PRINIVIL,ZESTRIL) 5 MG tablet Take 5 mg by mouth daily.  07/08/14  Yes Historical Provider, MD  metFORMIN (GLUCOPHAGE) 500 MG tablet Take 1,000 mg by mouth daily with breakfast.  06/24/14  Yes Historical Provider, MD  indomethacin (INDOCIN) 25 MG capsule Take 1 capsule (25 mg total) by mouth 3 (three) times daily with meals. 01/05/16   Shaune PollackQing Chen, MD    Allergies Patient has no known allergies.  Family History  Problem Relation Age of Onset  . CAD Mother     Social History Social History  Substance Use Topics  . Smoking status: Never Smoker  . Smokeless tobacco: Never Used  .  Alcohol use No    Review of Systems Constitutional: No fever/chills Eyes: No visual changes. ENT: No sore throat. Cardiovascular: Positive for chest pain. Respiratory: Denies shortness of breath. Gastrointestinal: No abdominal pain.  Positive for nausea. no vomiting.  No diarrhea.  No constipation. Genitourinary: Negative for dysuria. Musculoskeletal: Negative for back pain. Skin: Negative for rash. Neurological: Negative for headaches, focal weakness or numbness.  10-point ROS otherwise negative.  ____________________________________________   PHYSICAL EXAM:  VITAL SIGNS: ED Triage Vitals  Enc Vitals Group     BP 01/04/16 0230 113/76     Pulse Rate 01/04/16 0230 (!) 120     Resp 01/04/16 0230 20     Temp 01/04/16 0230 98.2 F (36.8 C)     Temp Source 01/04/16 0230 Oral     SpO2 01/04/16 0230 93 %     Weight 01/04/16 0228 235 lb (106.6 kg)     Height 01/04/16 0228 6' (1.829 m)     Head Circumference --      Peak Flow --      Pain Score 01/04/16 0228 9     Pain Loc --      Pain Edu? --      Excl. in GC? --     Constitutional: Alert and oriented. Apparent discomfort Eyes: Conjunctivae are normal. PERRL. EOMI. Head: Atraumatic. Mouth/Throat: Mucous membranes are moist.  Oropharynx non-erythematous. Neck: No stridor. Cardiovascular: Normal rate, regular rhythm. Good peripheral circulation. Grossly normal heart  sounds. Respiratory: Normal respiratory effort.  No retractions. Lungs CTAB. Gastrointestinal: Soft and nontender. No distention.  Musculoskeletal: No lower extremity tenderness nor edema. No gross deformities of extremities. Neurologic:  Normal speech and language. No gross focal neurologic deficits are appreciated.  Skin:  Skin is warm, dry and intact. No rash noted. Psychiatric: Mood and affect are normal. Speech and behavior are normal.  ____________________________________________   LABS (all labs ordered are listed, but only abnormal results are  displayed)  Labs Reviewed  BASIC METABOLIC PANEL - Abnormal; Notable for the following:       Result Value   Chloride 100 (*)    Glucose, Bld 284 (*)    Creatinine, Ser 1.33 (*)    GFR calc non Af Amer 56 (*)    All other components within normal limits  CBC - Abnormal; Notable for the following:    WBC 12.9 (*)    All other components within normal limits  GLUCOSE, CAPILLARY - Abnormal; Notable for the following:    Glucose-Capillary 213 (*)    All other components within normal limits  HEMOGLOBIN A1C - Abnormal; Notable for the following:    Hgb A1c MFr Bld 7.9 (*)    All other components within normal limits  TSH - Abnormal; Notable for the following:    TSH 8.297 (*)    All other components within normal limits  GLUCOSE, CAPILLARY - Abnormal; Notable for the following:    Glucose-Capillary 196 (*)    All other components within normal limits  GLUCOSE, CAPILLARY - Abnormal; Notable for the following:    Glucose-Capillary 170 (*)    All other components within normal limits  GLUCOSE, CAPILLARY - Abnormal; Notable for the following:    Glucose-Capillary 233 (*)    All other components within normal limits  GLUCOSE, CAPILLARY - Abnormal; Notable for the following:    Glucose-Capillary 182 (*)    All other components within normal limits  TROPONIN I  FIBRIN DERIVATIVES D-DIMER ([facility] ONLY)  TROPONIN I  TROPONIN I  TROPONIN I   ____________________________________________  EKG  ED ECG REPORT I, Roscoe N Hanish Laraia, the attending physician, personally viewed and interpreted this ECG.   Date: 01/05/2016  EKG Time: 2:30 AM  Rate: 120  Rhythm: Sinus tachycardia  Axis: Normal  Intervals: Normal  ST&T Change: None  ____________________________________________  RADIOLOGY I, Sparta N Jessaca Philippi, personally viewed and evaluated these images (plain radiographs) as part of my medical decision making, as well as reviewing the written report by the radiologist.  CLINICAL DATA:   Acute onset of midsternal chest pain and shortness of breath. Initial encounter.  EXAM: CT ANGIOGRAPHY CHEST WITH CONTRAST  TECHNIQUE: Multidetector CT imaging of the chest was performed using the standard protocol during bolus administration of intravenous contrast. Multiplanar CT image reconstructions and MIPs were obtained to evaluate the vascular anatomy.  CONTRAST:  75 mL of Isovue 370 IV contrast  COMPARISON:  Chest radiograph performed earlier today at 2:51 a.m.  FINDINGS: Cardiovascular:  There is no evidence of pulmonary embolus.  The heart remains normal in size. The thoracic aorta is grossly unremarkable. No significant calcific atherosclerotic disease is seen. The great vessels are unremarkable.  Mediastinum/Nodes: The mediastinum is unremarkable in appearance, aside from diffuse wall thickening along the mid to distal esophagus, concerning for acute esophagitis. No mediastinal lymphadenopathy is seen. Trace pericardial fluid is likely within normal limits. An apparent 5.3 x 1.6 cm pericardial cyst is noted on the right side.  Lungs/Pleura: Patchy  bibasilar atelectasis is noted. Mild interstitial prominence is seen. No pleural effusion or pneumothorax is identified. No masses are identified.  Upper Abdomen: The visualized portions the liver and spleen are grossly unremarkable. The visualized portions of the gallbladder, pancreas and adrenal glands are within normal limits.  Musculoskeletal: No acute osseous abnormalities are identified. The visualized musculature is unremarkable in appearance.  Review of the MIP images confirms the above findings.  IMPRESSION: 1. No evidence of pulmonary embolus. 2. Diffuse wall thickening along the mid to distal esophagus, concerning for acute esophagitis. 3. Apparent 5.3 x 1.6 cm pericardial cyst noted at the right side of the mediastinum. 4. Patchy bibasilar atelectasis, with mild interstitial  prominence. Lungs otherwise grossly clear.   Electronically Signed   By: Roanna Raider M.D.   On: 01/04/2016 04:31  Procedures    INITIAL IMPRESSION / ASSESSMENT AND PLAN / ED COURSE  Pertinent labs & imaging results that were available during my care of the patient were reviewed by me and considered in my medical decision making (see chart for details).     Clinical Course     ____________________________________________  FINAL CLINICAL IMPRESSION(S) / ED DIAGNOSES  Chest pain Esophagitis Pericardial cyst  MEDICATIONS GIVEN DURING THIS VISIT:  Medications  morphine 2 MG/ML injection (  Not Given 01/04/16 0310)  gi cocktail suspension (  Not Given 01/04/16 0447)  ondansetron (ZOFRAN) injection 4 mg (4 mg Intravenous Given 01/04/16 0306)  morphine 4 MG/ML injection 2 mg (2 mg Intravenous Given 01/04/16 0309)  aspirin chewable tablet 324 mg (324 mg Oral Given 01/04/16 0312)  iopamidol (ISOVUE-370) 76 % injection 75 mL (75 mLs Intravenous Contrast Given 01/04/16 0359)  gi cocktail (Maalox,Lidocaine,Donnatal) (30 mLs Oral Given 01/04/16 0445)     NEW OUTPATIENT MEDICATIONS STARTED DURING THIS VISIT:  Discharge Medication List as of 01/05/2016  9:38 AM      Discharge Medication List as of 01/05/2016  9:38 AM    CONTINUE these medications which have CHANGED   Details  indomethacin (INDOCIN) 25 MG capsule Take 1 capsule (25 mg total) by mouth 3 (three) times daily with meals., Starting Fri 01/05/2016, Print        Discharge Medication List as of 01/05/2016  9:38 AM       Note:  This document was prepared using Dragon voice recognition software and may include unintentional dictation errors.    Darci Current, MD 01/05/16 2240

## 2016-01-04 NOTE — Progress Notes (Signed)
The patient has chest pain in substernal area, exacerbated with a deep breath. He denies other symptoms. His vital signs are stable, physical examinations are unremarkable. Normal troponin level.  1. Pericardial cyst:  Pain control. Continue aspirin. Per Dr. Welton FlakesKhan, follow-up echocardiogram.  2. Diabetes mellitus type 2: Hold glyburide and metformin, on sliding scale insulin.  3. Hypertension: Controlled; continue lisinopril 4. CKD: Stage III; stable. 5. Neuropathy: Secondary to diabetes. Continue Neurontin

## 2016-01-04 NOTE — ED Triage Notes (Signed)
Pt in with co midsternal chest pain that started yesterday, no history of heart disease. Pain has been persistent with some shob. Pt has had cold symptoms but denies cough.

## 2016-01-04 NOTE — Progress Notes (Signed)
Nathan Wong is a 60 y.o. male  [id number]  Primary Cardiologist: Adrian Blackwater Reason for Consultation: Chest pain  HPI: This is 60 year old white male with a past medical history of hypertension and diabetes presented to the hospital with pleuritic type chest pain which is positional.   Review of Systems: No orthopnea PND or leg swelling   Past Medical History:  Diagnosis Date  . Diabetes mellitus without complication (HCC)   . Hypertension     Medications Prior to Admission  Medication Sig Dispense Refill  . gabapentin (NEURONTIN) 300 MG capsule Take 300 mg by mouth daily.     Marland Kitchen glyBURIDE micronized (GLYNASE) 3 MG tablet Take 3 mg by mouth daily with breakfast.   2  . indomethacin (INDOCIN) 25 MG capsule Take 25 mg by mouth 2 (two) times daily as needed.   1  . lisinopril (PRINIVIL,ZESTRIL) 5 MG tablet Take 5 mg by mouth daily.   4  . metFORMIN (GLUCOPHAGE) 500 MG tablet Take 1,000 mg by mouth daily with breakfast.   2     . docusate sodium  100 mg Oral BID  . enoxaparin (LOVENOX) injection  40 mg Subcutaneous Q24H  . gabapentin  300 mg Oral TID  . gi cocktail      . insulin aspart  0-5 Units Subcutaneous QHS  . insulin aspart  0-9 Units Subcutaneous TID WC  . lisinopril  5 mg Oral Daily  . metFORMIN  1,000 mg Oral Q breakfast  . morphine      . sodium chloride flush  3 mL Intravenous Q12H    Infusions: . sodium chloride      No Known Allergies  Social History   Social History  . Marital status: Single    Spouse name: N/A  . Number of children: N/A  . Years of education: N/A   Occupational History  . Not on file.   Social History Main Topics  . Smoking status: Never Smoker  . Smokeless tobacco: Not on file  . Alcohol use No  . Drug use: No  . Sexual activity: Not on file   Other Topics Concern  . Not on file   Social History Narrative  . No narrative on file    Family History  Problem Relation Age of Onset  . CAD Mother     PHYSICAL  EXAM: Vitals:   01/04/16 0717 01/04/16 0756  BP: 108/66 120/66  Pulse: (!) 106 99  Resp: 18   Temp:  98.1 F (36.7 C)    No intake or output data in the 24 hours ending 01/04/16 0904  General:  Well appearing. No respiratory difficulty HEENT: normal Neck: supple. no JVD. Carotids 2+ bilat; no bruits. No lymphadenopathy or thryomegaly appreciated. Cor: PMI nondisplaced. Regular rate & rhythm. No rubs, gallops or murmurs. Lungs: clear Abdomen: soft, nontender, nondistended. No hepatosplenomegaly. No bruits or masses. Good bowel sounds. Extremities: no cyanosis, clubbing, rash, edema Neuro: alert & oriented x 3, cranial nerves grossly intact. moves all 4 extremities w/o difficulty. Affect pleasant.  ECG: Normal sinus rhythm no acute changes  Results for orders placed or performed during the hospital encounter of 01/04/16 (from the past 24 hour(s))  Basic metabolic panel     Status: Abnormal   Collection Time: 01/04/16  2:40 AM  Result Value Ref Range   Sodium 135 135 - 145 mmol/L   Potassium 4.2 3.5 - 5.1 mmol/L   Chloride 100 (L) 101 - 111 mmol/L  CO2 25 22 - 32 mmol/L   Glucose, Bld 284 (H) 65 - 99 mg/dL   BUN 14 6 - 20 mg/dL   Creatinine, Ser 9.601.33 (H) 0.61 - 1.24 mg/dL   Calcium 8.9 8.9 - 45.410.3 mg/dL   GFR calc non Af Amer 56 (L) >60 mL/min   GFR calc Af Amer >60 >60 mL/min   Anion gap 10 5 - 15  CBC     Status: Abnormal   Collection Time: 01/04/16  2:40 AM  Result Value Ref Range   WBC 12.9 (H) 3.8 - 10.6 K/uL   RBC 5.08 4.40 - 5.90 MIL/uL   Hemoglobin 15.2 13.0 - 18.0 g/dL   HCT 09.843.1 11.940.0 - 14.752.0 %   MCV 84.8 80.0 - 100.0 fL   MCH 29.9 26.0 - 34.0 pg   MCHC 35.3 32.0 - 36.0 g/dL   RDW 82.914.2 56.211.5 - 13.014.5 %   Platelets 159 150 - 440 K/uL  Troponin I     Status: None   Collection Time: 01/04/16  2:40 AM  Result Value Ref Range   Troponin I <0.03 <0.03 ng/mL  Fibrin derivatives D-Dimer     Status: None   Collection Time: 01/04/16  2:43 AM  Result Value Ref Range    Fibrin derivatives D-dimer ([facility]) 350 0 - 499  Troponin I     Status: None   Collection Time: 01/04/16  7:08 AM  Result Value Ref Range   Troponin I <0.03 <0.03 ng/mL  Glucose, capillary     Status: Abnormal   Collection Time: 01/04/16  8:00 AM  Result Value Ref Range   Glucose-Capillary 213 (H) 65 - 99 mg/dL   Ct Angio Chest Pe W And/or Wo Contrast  Result Date: 01/04/2016 CLINICAL DATA:  Acute onset of midsternal chest pain and shortness of breath. Initial encounter. EXAM: CT ANGIOGRAPHY CHEST WITH CONTRAST TECHNIQUE: Multidetector CT imaging of the chest was performed using the standard protocol during bolus administration of intravenous contrast. Multiplanar CT image reconstructions and MIPs were obtained to evaluate the vascular anatomy. CONTRAST:  75 mL of Isovue 370 IV contrast COMPARISON:  Chest radiograph performed earlier today at 2:51 a.m. FINDINGS: Cardiovascular:  There is no evidence of pulmonary embolus. The heart remains normal in size. The thoracic aorta is grossly unremarkable. No significant calcific atherosclerotic disease is seen. The great vessels are unremarkable. Mediastinum/Nodes: The mediastinum is unremarkable in appearance, aside from diffuse wall thickening along the mid to distal esophagus, concerning for acute esophagitis. No mediastinal lymphadenopathy is seen. Trace pericardial fluid is likely within normal limits. An apparent 5.3 x 1.6 cm pericardial cyst is noted on the right side. Lungs/Pleura: Patchy bibasilar atelectasis is noted. Mild interstitial prominence is seen. No pleural effusion or pneumothorax is identified. No masses are identified. Upper Abdomen: The visualized portions the liver and spleen are grossly unremarkable. The visualized portions of the gallbladder, pancreas and adrenal glands are within normal limits. Musculoskeletal: No acute osseous abnormalities are identified. The visualized musculature is unremarkable in appearance. Review of the MIP  images confirms the above findings. IMPRESSION: 1. No evidence of pulmonary embolus. 2. Diffuse wall thickening along the mid to distal esophagus, concerning for acute esophagitis. 3. Apparent 5.3 x 1.6 cm pericardial cyst noted at the right side of the mediastinum. 4. Patchy bibasilar atelectasis, with mild interstitial prominence. Lungs otherwise grossly clear. Electronically Signed   By: Roanna RaiderJeffery  Chang M.D.   On: 01/04/2016 04:31   Dg Chest Portable 1 View  Result  Date: 01/04/2016 CLINICAL DATA:  Acute onset of midsternal chest pain and shortness of breath. Initial encounter. EXAM: PORTABLE CHEST 1 VIEW COMPARISON:  Chest radiograph performed 07/19/2014 FINDINGS: The lungs are well-aerated. Mild bibasilar atelectasis is noted. There is no evidence of pleural effusion or pneumothorax. The cardiomediastinal silhouette is borderline normal in size. No acute osseous abnormalities are seen. IMPRESSION: Mild bibasilar atelectasis noted.  Lungs otherwise grossly clear. Electronically Signed   By: Roanna RaiderJeffery  Chang M.D.   On: 01/04/2016 03:05   [place] Abdomen Limited Ruq  Result Date: 01/04/2016 CLINICAL DATA:  Acute onset of right upper quadrant abdominal pain. Initial encounter. EXAM: US ABDOMEN LIMITED - RIGHT UPPER QUADRANT COMPARISON:  CT of the abdomen and pelvis from 04/29/2011 FINDINGS: Gallbladder: No gallstones or wall thickening visualized. No sonographic Murphy sign noted by sonographer. Common bile duct: Diameter: 0.4 cm, within normal limits in caliber. Liver: No focal lesion identified. Mildly increased parenchymal echogenicity raises concern for fatty infiltration. IMPRESSION: 1. No acute abnormality at the right upper quadrant. 2. Mild fatty infiltration within the liver. Electronically Signed   By: Roanna RaiderJeffery  Chang M.D.   On: 01/04/2016 03:52     ASSESSMENT AND PLAN: Patient has pericardial effusion with questionable pericardial cyst and pleuritic/positional atypical chest pain. We will locate  echocardiogram and further evaluate the patient based on the finding on the echocardiogram does not appear to be having MI as cardiac enzymes are negative and EKG is unremarkable.  Brunette Lavalle A

## 2016-01-05 LAB — HEMOGLOBIN A1C
Hgb A1c MFr Bld: 7.9 % — ABNORMAL HIGH (ref 4.8–5.6)
Mean Plasma Glucose: 180 mg/dL

## 2016-01-05 LAB — GLUCOSE, CAPILLARY: Glucose-Capillary: 182 mg/dL — ABNORMAL HIGH (ref 65–99)

## 2016-01-05 LAB — ECHOCARDIOGRAM COMPLETE
HEIGHTINCHES: 72 in
Weight: 3760 oz

## 2016-01-05 MED ORDER — INDOMETHACIN 25 MG PO CAPS: 25.0000 mg | ORAL_CAPSULE | Freq: Three times a day (TID) | ORAL | 0 refills | Status: DC

## 2016-01-05 MED ORDER — INDOMETHACIN 25 MG PO CAPS
25.0000 mg | ORAL_CAPSULE | Freq: Three times a day (TID) | ORAL | Status: DC
Start: 2016-01-05 — End: 2016-01-05
  Filled 2016-01-05: qty 1

## 2016-01-05 NOTE — Progress Notes (Signed)
SUBJECTIVE: Patient has occasional chest pain with deep inspiration   Vitals:   01/04/16 1202 01/04/16 1943 01/05/16 0324 01/05/16 0807  BP: 108/69 107/65 103/70 117/76  Pulse:  93 99 99  Resp:  18 18 16   Temp: 98.1 F (36.7 C) 97.9 F (36.6 C) 98.1 F (36.7 C) 97.7 F (36.5 C)  TempSrc: Oral Oral Oral Oral  SpO2:  93% 91% 96%  Weight:   221 lb 3.2 oz (100.3 kg)   Height:        Intake/Output Summary (Last 24 hours) at 01/05/16 0840 Last data filed at 01/05/16 0328  Gross per 24 hour  Intake             1500 ml  Output              900 ml  Net              600 ml    LABS: Basic Metabolic Panel:  Recent Labs  16/11/9609/30/17 0240  NA 135  K 4.2  CL 100*  CO2 25  GLUCOSE 284*  BUN 14  CREATININE 1.33*  CALCIUM 8.9   Liver Function Tests: No results for input(s): AST, ALT, ALKPHOS, BILITOT, PROT, ALBUMIN in the last 72 hours. No results for input(s): LIPASE, AMYLASE in the last 72 hours. CBC:  Recent Labs  01/04/16 0240  WBC 12.9*  HGB 15.2  HCT 43.1  MCV 84.8  PLT 159   Cardiac Enzymes:  Recent Labs  01/04/16 0708 01/04/16 1000 01/04/16 1546  TROPONINI <0.03 <0.03 <0.03   BNP: Invalid input(s): POCBNP D-Dimer: No results for input(s): DDIMER in the last 72 hours. Hemoglobin A1C:  Recent Labs  01/04/16 0240  HGBA1C 7.9*   Fasting Lipid Panel: No results for input(s): CHOL, HDL, LDLCALC, TRIG, CHOLHDL, LDLDIRECT in the last 72 hours. Thyroid Function Tests:  Recent Labs  01/04/16 0240  TSH 8.297*   Anemia Panel: No results for input(s): VITAMINB12, FOLATE, FERRITIN, TIBC, IRON, RETICCTPCT in the last 72 hours.   PHYSICAL EXAM General: Well developed, well nourished, in no acute distress HEENT:  Normocephalic and atramatic Neck:  No JVD.  Lungs: Clear bilaterally to auscultation and percussion. Heart: HRRR . Normal S1 and S2 without gallops or murmurs.  Abdomen: Bowel sounds are positive, abdomen soft and non-tender  Msk:  Back  normal, normal gait. Normal strength and tone for age. Extremities: No clubbing, cyanosis or edema.   Neuro: Alert and oriented X 3. Psych:  Good affect, responds appropriately  TELEMETRY: Sinus rhythm  ASSESSMENT AND PLAN: Atypical chest pain which is pleuritic probably due to mild pericardial effusion on echocardiogram, advise either aspirin or Indocin for one week. There was no pericardial cyst seen on echocardiogram but right side was dilated and normal left reticular systolic function with mild pericardial effusion. Patient can go home with follow-up in the office 1 PM on Tuesday and will do outpatient stress test.  Active Problems:   Pericardial cyst    Laurier NancyKHAN,SHAUKAT A, MD, [facility] 01/05/2016 8:40 AM

## 2016-01-05 NOTE — Discharge Summary (Signed)
[facility] [place] at [facility]   PATIENT NAME: Nathan Wong    MR#:  [id number]  DATE OF BIRTH:  08-16-1955  DATE OF ADMISSION:  01/04/2016   ADMITTING PHYSICIAN: Arnaldo NatalMichael S Diamond, MD  DATE OF DISCHARGE: 01/05/2016 10:00 AM  PRIMARY CARE PHYSICIAN: [name],[name], MD   ADMISSION DIAGNOSIS:  RUQ pain [R10.11] DISCHARGE DIAGNOSIS:  Active Problems:   Pericardial cyst Pericardial Effusion SECONDARY DIAGNOSIS:   Past Medical History:  Diagnosis Date  . Diabetes mellitus without complication (HCC)   . H/O hernia repair   . Hypertension    HOSPITAL COURSE:  The patient has no chest pain today.  1. Pericardial Effusion: Mild. Pain control. Continue aspirin. Indomethacin 3 times a day for 1 week per Dr. Welton FlakesKhan, follow-up him as outpatient.  Echocardiogram: Normal left ventricular systolic function with mild diastolic   dysfunction and dilated right atrium and right ventricle with   mild pericardial effusion and no evidence of cyst in the   pericardium..  2. Diabetes mellitus type 2: Hold glyburide and metformin, on sliding scale insulin.  3. Hypertension: Controlled; continue lisinopril 4. CKD: Stage III; stable. 5. Neuropathy: Secondary to diabetes. Continue Neurontin  DISCHARGE CONDITIONS:  Stable, discharged home today. CONSULTS OBTAINED:  Treatment Team:  Yevonne PaxSaadat A Khan, MD Laurier NancyShaukat A Khan, MD DRUG ALLERGIES:  No Known Allergies DISCHARGE MEDICATIONS:     Medication List    TAKE these medications   gabapentin 300 MG capsule Commonly known as:  NEURONTIN Take 300 mg by mouth daily.   glyBURIDE micronized 3 MG tablet Commonly known as:  GLYNASE Take 3 mg by mouth daily with breakfast.   indomethacin 25 MG capsule Commonly known as:  INDOCIN Take 1 capsule (25 mg total) by mouth 3 (three) times daily with meals. What changed:  when to take this  reasons to take this   lisinopril 5 MG tablet Commonly known as:   PRINIVIL,ZESTRIL Take 5 mg by mouth daily.   metFORMIN 500 MG tablet Commonly known as:  GLUCOPHAGE Take 1,000 mg by mouth daily with breakfast.        DISCHARGE INSTRUCTIONS:  See AVS.  If you experience worsening of your admission symptoms, develop shortness of breath, life threatening emergency, suicidal or homicidal thoughts you must seek medical attention immediately by calling 911 or calling your MD immediately  if symptoms less severe.  You Must read complete instructions/literature along with all the possible adverse reactions/side effects for all the Medicines you take and that have been prescribed to you. Take any new Medicines after you have completely understood and accpet all the possible adverse reactions/side effects.   Please note  You were cared for by a hospitalist during your hospital stay. If you have any questions about your discharge medications or the care you received while you were in the hospital after you are discharged, you can call the unit and asked to speak with the hospitalist on call if the hospitalist that took care of you is not available. Once you are discharged, your primary care physician will handle any further medical issues. Please note that NO REFILLS for any discharge medications will be authorized once you are discharged, as it is imperative that you return to your primary care physician (or establish a relationship with a primary care physician if you do not have one) for your aftercare needs so that they can reassess your need for medications and monitor your lab values.    On the day of  Discharge:  VITAL SIGNS:  Blood pressure 117/76, pulse 99, temperature 97.7 F (36.5 C), temperature source Oral, resp. rate 16, height 6' (1.829 m), weight 221 lb 3.2 oz (100.3 kg), SpO2 96 %. PHYSICAL EXAMINATION:  GENERAL:  60 y.o.-year-old patient lying in the bed with no acute distress.  EYES: Pupils equal, round, reactive to light and accommodation. No  scleral icterus. Extraocular muscles intact.  HEENT: Head atraumatic, normocephalic. Oropharynx and nasopharynx clear.  NECK:  Supple, no jugular venous distention. No thyroid enlargement, no tenderness.  LUNGS: Normal breath sounds bilaterally, no wheezing, rales,rhonchi or crepitation. No use of accessory muscles of respiration.  CARDIOVASCULAR: S1, S2 normal. No murmurs, rubs, or gallops.  ABDOMEN: Soft, non-tender, non-distended. Bowel sounds present. No organomegaly or mass.  EXTREMITIES: No pedal edema, cyanosis, or clubbing.  NEUROLOGIC: Cranial nerves II through XII are intact. Muscle strength 5/5 in all extremities. Sensation intact. Gait not checked.  PSYCHIATRIC: The patient is alert and oriented x 3.  SKIN: No obvious rash, lesion, or ulcer.  DATA REVIEW:   CBC  Recent Labs Lab 01/04/16 0240  WBC 12.9*  HGB 15.2  HCT 43.1  PLT 159    Chemistries   Recent Labs Lab 01/04/16 0240  NA 135  K 4.2  CL 100*  CO2 25  GLUCOSE 284*  BUN 14  CREATININE 1.33*  CALCIUM 8.9     Microbiology Results  No results found for this or any previous visit.  RADIOLOGY:  No results found.   Management plans discussed with the patient, family and they are in agreement.  CODE STATUS:  Code Status History    Date Active Date Inactive Code Status Order ID Comments User Context   01/04/2016  7:55 AM 01/05/2016  1:09 PM Full Code [id number]  Arnaldo NatalMichael S Diamond, MD ED      TOTAL TIME TAKING CARE OF THIS PATIENT: 26 minutes.    Shaune Pollackhen, Aristide Waggle M.D on 01/05/2016 at 6:07 PM  Between 7am to 6pm - Pager - [phone]  After 6pm go to [email or web address] - Social research officer, governmentpassword [email or web address]  [facility] [employer] [facility]  Office  [phone]  CC: Primary care physician; Corky DownsMASOUD,[name], MD   Note: This dictation was prepared with Dragon dictation along with smaller phrase technology. Any transcriptional errors that result from this process are unintentional.

## 2016-01-05 NOTE — Progress Notes (Signed)
Iv and tele removed from patient. Discharge instructions given to patient along with hard copy prescription. Patient verbalized understanding and denied any questions.  No acute distress at this time. Family is at bedside and will be transporting patient home.

## 2018-06-01 ENCOUNTER — Emergency Department: Payer: Self-pay

## 2018-06-01 ENCOUNTER — Emergency Department
Admission: EM | Admit: 2018-06-01 | Discharge: 2018-06-01 | Disposition: A | Discharge status: Home / Self Care | Payer: Self-pay | Attending: Emergency Medicine | Admitting: Emergency Medicine

## 2018-06-01 ENCOUNTER — Encounter: Payer: Self-pay | Admitting: Emergency Medicine

## 2018-06-01 ENCOUNTER — Other Ambulatory Visit: Payer: Self-pay

## 2018-06-01 DIAGNOSIS — Z794 Long term (current) use of insulin: Secondary | ICD-10-CM | POA: Insufficient documentation

## 2018-06-01 DIAGNOSIS — E119 Type 2 diabetes mellitus without complications: Secondary | ICD-10-CM | POA: Insufficient documentation

## 2018-06-01 DIAGNOSIS — M7918 Myalgia, other site: Secondary | ICD-10-CM

## 2018-06-01 DIAGNOSIS — I10 Essential (primary) hypertension: Secondary | ICD-10-CM | POA: Insufficient documentation

## 2018-06-01 DIAGNOSIS — M79661 Pain in right lower leg: Secondary | ICD-10-CM | POA: Insufficient documentation

## 2018-06-01 DIAGNOSIS — Z7982 Long term (current) use of aspirin: Secondary | ICD-10-CM | POA: Insufficient documentation

## 2018-06-01 DIAGNOSIS — M5441 Lumbago with sciatica, right side: Secondary | ICD-10-CM | POA: Insufficient documentation

## 2018-06-01 DIAGNOSIS — Z79899 Other long term (current) drug therapy: Secondary | ICD-10-CM | POA: Insufficient documentation

## 2018-06-01 MED ORDER — OXYCODONE-ACETAMINOPHEN 5-325 MG PO TABS
1.0000 | ORAL_TABLET | Freq: Once | ORAL | Status: AC
  Administered 2018-06-01: 17:00:00 1 via ORAL
  Filled 2018-06-01: qty 1

## 2018-06-01 MED ORDER — ONDANSETRON 4 MG PO TBDP
4.0000 mg | ORAL_TABLET | Freq: Once | ORAL | Status: AC
  Administered 2018-06-01: 17:00:00 4 mg via ORAL
  Filled 2018-06-01: qty 1

## 2018-06-01 MED ORDER — DEXAMETHASONE SODIUM PHOSPHATE 10 MG/ML IJ SOLN
10.0000 mg | Freq: Once | INTRAMUSCULAR | Status: AC
  Administered 2018-06-01: 10 mg via INTRAMUSCULAR
  Filled 2018-06-01: qty 1

## 2018-06-01 MED ORDER — PREDNISONE 10 MG (21) PO TBPK: ORAL_TABLET | ORAL | 0 refills | Status: DC

## 2018-06-01 MED ORDER — METHOCARBAMOL 500 MG PO TABS
1000.0000 mg | ORAL_TABLET | Freq: Once | ORAL | Status: AC
  Administered 2018-06-01: 16:00:00 1000 mg via ORAL
  Filled 2018-06-01: qty 2

## 2018-06-01 NOTE — ED Triage Notes (Signed)
R hip pain radiating down R leg x 3 days. Denies fall or injury when it started.

## 2018-06-01 NOTE — ED Provider Notes (Signed)
[facility] Emergency Department Provider Note  ____________________________________________  Time seen: Approximately 3:33 PM  I have reviewed the triage vital signs and the nursing notes.   HISTORY  Chief Complaint Hip Pain    HPI Nathan Wong is a 63 y.o. male presents to the emergency department with 8 out of 10 right lateral hip pain with radiating pain to the groin and along the right anterior thigh that feels like numbness and tingling. Pain stops at the medial aspect of the right knee. Patient states that he has had symptoms for the past 2 to 3 days.  He denies recent falls or traumas.  He has had difficulty with ambulation. Pain is most severe with ambulation and standing and relieved with rest. Patient reports that his pain resolves with rest.  He has noticed no swelling or erythema along the right leg.  He denies prior history of DVT or recent surgery.  No pleuritic chest pain. Patient has been taking Goody Powders at home.         Past Medical History:  Diagnosis Date  . Diabetes mellitus without complication (HCC)   . H/O hernia repair   . Hypertension     Patient Active Problem List   Diagnosis Date Noted  . Pericardial cyst 01/04/2016    Past Surgical History:  Procedure Laterality Date  . HERNIA REPAIR     abdominal    Prior to Admission medications   Medication Sig Start Date End Date Taking? Authorizing Provider  aspirin EC 81 MG tablet Take 81 mg by mouth daily.   Yes [provider]  insulin glargine (LANTUS) 100 UNIT/ML injection Inject into the skin daily.   Yes [provider]  lisinopril (PRINIVIL,ZESTRIL) 5 MG tablet Take 5 mg by mouth daily.  07/08/14   [provider]  metFORMIN (GLUCOPHAGE) 500 MG tablet Take 1,000 mg by mouth daily with breakfast.  06/24/14   [provider]  predniSONE (STERAPRED UNI-PAK 21 TAB) 10 MG (21) TBPK tablet Take 6 tabs the the 1st day. Take 6 tabs the the  2nd day. Take 5 tabs the the 3rd day. Take 5 tabs the 4th day. Take 4 tabs the the 5th day.Take 4 tabs the the 6th day.Take 3 tabs the 7th day.Take 3 tabs the 8th day. Take 2 tabs the 9th day. Take 2 tabs the 10th day. Take 1 tab the 11th day. Take 1 tab the 12th day. 06/01/18   Orvil FeilWoods, Cordarius Benning M, PA-C    Allergies Patient has no known allergies.  Family History  Problem Relation Age of Onset  . CAD Mother     Social History Social History   Tobacco Use  . Smoking status: Never Smoker  . Smokeless tobacco: Never Used  Substance Use Topics  . Alcohol use: No  . Drug use: No     Review of Systems  Constitutional: No fever/chills Eyes: No visual changes. No discharge ENT: No upper respiratory complaints. Cardiovascular: no chest pain. Respiratory: no cough. No SOB. Gastrointestinal: No abdominal pain.  No nausea, no vomiting.  No diarrhea.  No constipation. Genitourinary: Negative for dysuria. No hematuria Musculoskeletal: Patient has right hip pain.  Skin: Negative for rash, abrasions, lacerations, ecchymosis. Neurological: Negative for headaches, focal weakness or numbness.   ____________________________________________   PHYSICAL EXAM:  VITAL SIGNS: ED Triage Vitals  Enc Vitals Group     BP 06/01/18 1438 (!) 157/86     Pulse Rate 06/01/18 1438 (!) 102  Resp 06/01/18 1438 20     Temp 06/01/18 1438 97.7 F (36.5 C)     Temp Source 06/01/18 1438 Oral     SpO2 06/01/18 1438 97 %     Weight 06/01/18 1439 223 lb (101.2 kg)     Height 06/01/18 1439 6' (1.829 m)     Head Circumference --      Peak Flow --      Pain Score 06/01/18 1439 10     Pain Loc --      Pain Edu? --      Excl. in GC? --      Constitutional: Alert and oriented. Well appearing and in no acute distress. Eyes: Conjunctivae are normal. PERRL. EOMI. Head: Atraumatic. Cardiovascular: Normal rate, regular rhythm. Normal S1 and S2.  Good peripheral circulation. Respiratory: Normal respiratory  effort without tachypnea or retractions. Lungs CTAB. Good air entry to the bases with no decreased or absent breath sounds. Gastrointestinal: Bowel sounds 4 quadrants. Soft and nontender to palpation. No guarding or rigidity. No palpable masses. No distention. No CVA tenderness.  No palpable hernias. Musculoskeletal: Patient has reproducible right hip pain with internal and external rotation at the right hip.  He also has a positive straight leg raise test, right.  Palpable dorsalis pedis pulse, right.  Capillary refill of all 5 right toes less than 3 seconds. Neurologic:  Normal speech and language. No gross focal neurologic deficits are appreciated.  Skin: No erythema or swelling of the right lower extremity. Psychiatric: Mood and affect are normal. Speech and behavior are normal. Patient exhibits appropriate insight and judgement.   ____________________________________________   LABS (all labs ordered are listed, but only abnormal results are displayed)  Labs Reviewed - No data to display ____________________________________________  EKG   ____________________________________________  RADIOLOGY I personally viewed and evaluated these images as part of my medical decision making, as well as reviewing the written report by the radiologist.    US Venous Img Lower Unilateral Right  Result Date: 06/01/2018 CLINICAL DATA:  63 year old male with right lower extremity pain EXAM: RIGHT LOWER EXTREMITY VENOUS DOPPLER ULTRASOUND TECHNIQUE: Gray-scale sonography with graded compression, as well as color Doppler and duplex ultrasound were performed to evaluate the lower extremity deep venous systems from the level of the common femoral vein and including the common femoral, femoral, profunda femoral, popliteal and calf veins including the posterior tibial, peroneal and gastrocnemius veins when visible. The superficial great saphenous vein was also interrogated. Spectral Doppler was utilized to  evaluate flow at rest and with distal augmentation maneuvers in the common femoral, femoral and popliteal veins. COMPARISON:  None. FINDINGS: Contralateral Common Femoral Vein: Respiratory phasicity is normal and symmetric with the symptomatic side. No evidence of thrombus. Normal compressibility. Common Femoral Vein: No evidence of thrombus. Normal compressibility, respiratory phasicity and response to augmentation. Saphenofemoral Junction: No evidence of thrombus. Normal compressibility and flow on color Doppler imaging. Profunda Femoral Vein: No evidence of thrombus. Normal compressibility and flow on color Doppler imaging. Femoral Vein: No evidence of thrombus. Normal compressibility, respiratory phasicity and response to augmentation. Popliteal Vein: No evidence of thrombus. Normal compressibility, respiratory phasicity and response to augmentation. Calf Veins: No evidence of thrombus. Normal compressibility and flow on color Doppler imaging. Superficial Great Saphenous Vein: No evidence of thrombus. Normal compressibility. Venous Reflux:  None. Other Findings:  None. IMPRESSION: No evidence of deep venous thrombosis. Electronically Signed   By: Malachy Moan M.D.   On: 06/01/2018 17:21   Dg  Hip Unilat W Or Wo Pelvis 2-3 Views Right  Result Date: 06/01/2018 CLINICAL DATA:  Right hip pain for the past 2 days. No recent injury. EXAM: DG HIP (WITH OR WITHOUT PELVIS) 2-3V RIGHT COMPARISON:  Right hip x-rays dated September 08, 2013. FINDINGS: No acute fracture or dislocation. Joint spaces are preserved. Faint chondrocalcinosis. Bone mineralization is normal. Soft tissues are unremarkable. Vascular calcifications. IMPRESSION: 1. Right hip chondrocalcinosis which can be seen with CPPD arthropathy. 2. No acute osseous abnormality or significant degenerative changes. Electronically Signed   By: Obie Dredge M.D.   On: 06/01/2018 15:57     ____________________________________________    PROCEDURES  Procedure(s) performed:    Procedures    Medications  dexamethasone (DECADRON) injection 10 mg (10 mg Intramuscular Given 06/01/18 1550)  methocarbamol (ROBAXIN) tablet 1,000 mg (1,000 mg Oral Given 06/01/18 1550)  oxyCODONE-acetaminophen (PERCOCET/ROXICET) 5-325 MG per tablet 1 tablet (1 tablet Oral Given 06/01/18 1645)  ondansetron (ZOFRAN-ODT) disintegrating tablet 4 mg (4 mg Oral Given 06/01/18 1645)     ____________________________________________   INITIAL IMPRESSION / ASSESSMENT AND PLAN / ED COURSE  Pertinent labs & imaging results that were available during my care of the patient were reviewed by me and considered in my medical decision making (see chart for details).  Review of the [place] CSRS was performed in accordance of the [facility] prior to dispensing any controlled drugs.           Assessment and Plan:  Musculoskeletal pain. 63 year old male presents to the emergency department with right lower extremity pain that is occurred for the past 2 to 3 days.  Some historical information was hard to elicit as patient is challenging historian.  On physical exam, patient had reproducible tenderness with internal and external rotation at the right hip.  He also had a positive straight leg raise test on the right.  He was able to perform full range of motion at the right hip, right knee and right ankle.  There was no erythema or swelling of the right lower extremity.  Differential diagnosis included radicular pain, right hip arthritis and DVT  X-ray examination of the right hip revealed arthritic changes.  Description of patient's pain is consistent with radicular pain.  Patient stated that he experienced improvement with Decadron and Robaxin administered in the emergency department.  Patient was also given a Percocet.  No evidence of DVT on venous ultrasound of the right lower extremity.  Patient was disposed to  home with tapered prednisone.  He was advised to follow-up with orthopedics as needed.  All patient questions were answered.   ____________________________________________  FINAL CLINICAL IMPRESSION(S) / ED DIAGNOSES  Final diagnoses:  Acute bilateral low back pain with right-sided sciatica      NEW MEDICATIONS STARTED DURING THIS VISIT:  ED Discharge Orders         Ordered    predniSONE (STERAPRED UNI-PAK 21 TAB) 10 MG (21) TBPK tablet     06/01/18 1757              This chart was dictated using voice recognition software/Dragon. Despite best efforts to proofread, errors can occur which can change the meaning. Any change was purely unintentional.    Orvil Feil, PA-C 06/01/18 1814    Arnaldo Natal, MD 06/01/18 [phone]

## 2018-06-01 NOTE — ED Notes (Signed)
See triage note  Presents with pain to right hip  States pain started a few days ago w/o injury  States pain radiates from hip into upper leg/ groin area   States pain is increased with standing or ambulation

## 2018-09-07 ENCOUNTER — Ambulatory Visit: Payer: Self-pay | Admitting: Pharmacy Technician

## 2018-09-07 ENCOUNTER — Other Ambulatory Visit: Payer: Self-pay

## 2018-09-07 DIAGNOSIS — Z79899 Other long term (current) drug therapy: Secondary | ICD-10-CM

## 2018-09-07 NOTE — Progress Notes (Signed)
Completed financial assistance application for [place] due to recent hospital visit. Patient agreed to be responsible for gathering financial information and forwarding to appropriate department in [facility].    Patient verbally agreed to all terms of the [facility] contract.   Patient approved to receive medication assistance from [facility] as long as eligibility criteria continues to be met..    Provided patient with community resource material based on his particular needs.    Lantus Prescription Application completed with patient.  Forwarded to Dr. Lavera Guise for signature.  Upon receipt of signed application from provider, Lantus Prescription Application will be submitted to pharmaceutical company.  [facility]

## 2018-10-05 ENCOUNTER — Ambulatory Visit: Payer: Self-pay | Admitting: Pharmacist

## 2018-10-05 ENCOUNTER — Other Ambulatory Visit: Payer: Self-pay

## 2018-10-05 DIAGNOSIS — Z79899 Other long term (current) drug therapy: Secondary | ICD-10-CM

## 2018-10-05 NOTE — Progress Notes (Signed)
  [facility] Visit Note  Patient: Nathan Wong MRN: [id number] Date of Birth: 01/19/1956 PCP: Nathan Athens, MD   Nathan Wong 63 y.o. male presents for an initial medication management visit today.  There were no vitals taken for this visit.  Patient Information   Past Medical History:  Diagnosis Date  . Diabetes mellitus without complication ([place])   . H/O hernia repair   . Hypertension       Past Surgical History:  Procedure Laterality Date  . HERNIA REPAIR     abdominal     Family History  Problem Relation Age of Onset  . CAD Mother   . Early death Neg Hx     New Diagnoses (since last visit):   Family Support: Lives with daughter-in-law and son     Current Exercise Habits: Home exercise routine, Type of exercise: walking, Time (Minutes): 20, Frequency (Times/Week): 3, Weekly Exercise (Minutes/Week): 60, Intensity: Mild       Social History   Substance and Sexual Activity  Alcohol Use No      Social History   Tobacco Use  Smoking Status Never Smoker  Smokeless Tobacco Never Used      Health Maintenance/Date Completed  Last ED visit: Not asked Last Visit to PCP: August 2020 Next Visit to PCP: September 2020 Specialist Visit: No Dental Exam: No Eye Exam: No Prostate Exam: No DEXA: No Colonoscopy: No Flu Vaccine: Yes Pneumonia Vaccine: No  Outpatient Encounter Medications as of 10/05/2018  Medication Sig  . acetaminophen (TYLENOL) 500 MG tablet Take 500 mg by mouth every 6 (six) hours as needed for mild pain. Just takes PRN and takes 2 tab qAM.  . cholecalciferol (VITAMIN D3) 25 MCG (1000 UT) tablet Take 1,000 Units by mouth daily.  Marland Kitchen gabapentin (NEURONTIN) 100 MG capsule Take 100 mg by mouth 3 (three) times daily. Pt currently taking 1 cap (100 mg) daily.  Marland Kitchen glipiZIDE (GLUCOTROL) 10 MG tablet Take 5 mg by mouth daily. Takes with food.  . Insulin Degludec 100 UNIT/ML SOLN Inject 20 Units into the skin every morning.  Marland Kitchen  levothyroxine (SYNTHROID) 100 MCG tablet Take 50 mcg by mouth daily before breakfast.  . lisinopril (PRINIVIL,ZESTRIL) 5 MG tablet Take 5 mg by mouth daily.   . metFORMIN (GLUCOPHAGE) 500 MG tablet Take 1,000 mg by mouth daily with breakfast.   . aspirin EC 81 MG tablet Take 81 mg by mouth daily.  . insulin glargine (LANTUS) 100 UNIT/ML injection Inject 30 Units into the skin daily. Not taking currently as of 10/05/18.  Taking [place] until he can afford Lantus.  . predniSONE (STERAPRED UNI-PAK 21 TAB) 10 MG (21) TBPK tablet Take 6 tablets the first day, take 5 tablets the second day, take 4 tablets the third day, take 3 tablets the fourth day, take 2 tablets the fifth day, take 1 tablet the sixth day. (Patient not taking: Reported on 10/05/2018)   No facility-administered encounter medications on file as of 10/05/2018.      Assessment and Plan:  Adherence:  No problems with adherence.  Pt  Keeps medications next to his bed.  DM - Pt currently switching from [place]  to Lantus as soon as he is authorized for payment assistance.  [facility] will continue follow up with this.  HTN - BP reports improving on lisinopril.  Pt reports BP decreasing from SBP 200s to SBP 160s.  Nathan Wong, PharmD Pharmacy Resident  10/05/2018 4:24 PM

## 2018-10-22 ENCOUNTER — Ambulatory Visit: Payer: Self-pay | Admitting: Gerontology

## 2018-10-22 ENCOUNTER — Encounter: Payer: Self-pay | Admitting: Gerontology

## 2018-10-22 ENCOUNTER — Other Ambulatory Visit: Payer: Self-pay

## 2018-10-22 VITALS — BP 143/86 | HR 97 | Ht 72.0 in | Wt 221.0 lb

## 2018-10-22 DIAGNOSIS — I10 Essential (primary) hypertension: Secondary | ICD-10-CM

## 2018-10-22 DIAGNOSIS — Z0120 Encounter for dental examination and cleaning without abnormal findings: Secondary | ICD-10-CM | POA: Insufficient documentation

## 2018-10-22 DIAGNOSIS — Z9189 Other specified personal risk factors, not elsewhere classified: Secondary | ICD-10-CM | POA: Insufficient documentation

## 2018-10-22 DIAGNOSIS — E039 Hypothyroidism, unspecified: Secondary | ICD-10-CM | POA: Insufficient documentation

## 2018-10-22 DIAGNOSIS — Z7689 Persons encountering health services in other specified circumstances: Secondary | ICD-10-CM

## 2018-10-22 DIAGNOSIS — E1140 Type 2 diabetes mellitus with diabetic neuropathy, unspecified: Secondary | ICD-10-CM | POA: Insufficient documentation

## 2018-10-22 MED ORDER — GABAPENTIN 300 MG PO CAPS: 300.0000 mg | ORAL_CAPSULE | Freq: Three times a day (TID) | ORAL | 0 refills | Status: DC

## 2018-10-22 MED ORDER — GLIPIZIDE 10 MG PO TABS
5.0000 mg | ORAL_TABLET | Freq: Every day | ORAL | 2 refills | Status: DC
Start: 2018-10-28 — End: 2018-11-10

## 2018-10-22 NOTE — Patient Instructions (Signed)
Carbohydrate Counting for Diabetes Mellitus, Adult  Carbohydrate counting is a method of keeping track of how many carbohydrates you eat. Eating carbohydrates naturally increases the amount of sugar (glucose) in the blood. Counting how many carbohydrates you eat helps keep your blood glucose within normal limits, which helps you manage your diabetes (diabetes mellitus). It is important to know how many carbohydrates you can safely have in each meal. This is different for every person. A diet and nutrition specialist (registered dietitian) can help you make a meal plan and calculate how many carbohydrates you should have at each meal and snack. Carbohydrates are found in the following foods:  Grains, such as breads and cereals.  Dried beans and soy products.  Starchy vegetables, such as potatoes, peas, and corn.  Fruit and fruit juices.  Milk and yogurt.  Sweets and snack foods, such as cake, cookies, candy, chips, and soft drinks. How do I count carbohydrates? There are two ways to count carbohydrates in food. You can use either of the methods or a combination of both. Reading "Nutrition Facts" on packaged food The "Nutrition Facts" list is included on the labels of almost all packaged foods and beverages in the [place] It includes:  The serving size.  Information about nutrients in each serving, including the grams (g) of carbohydrate per serving. To use the "Nutrition Facts":  Decide how many servings you will have.  Multiply the number of servings by the number of carbohydrates per serving.  The resulting number is the total amount of carbohydrates that you will be having. Learning standard serving sizes of other foods When you eat carbohydrate foods that are not packaged or do not include "Nutrition Facts" on the label, you need to measure the servings in order to count the amount of carbohydrates:  Measure the foods that you will eat with a food scale or measuring cup, if needed.   Decide how many standard-size servings you will eat.  Multiply the number of servings by 15. Most carbohydrate-rich foods have about 15 g of carbohydrates per serving. ? For example, if you eat 8 oz (170 g) of strawberries, you will have eaten 2 servings and 30 g of carbohydrates (2 servings x 15 g = 30 g).  For foods that have more than one food mixed, such as soups and casseroles, you must count the carbohydrates in each food that is included. The following list contains standard serving sizes of common carbohydrate-rich foods. Each of these servings has about 15 g of carbohydrates:   hamburger bun or  English muffin.   oz (15 mL) syrup.   oz (14 g) jelly.  1 slice of bread.  1 six-inch tortilla.  3 oz (85 g) cooked rice or pasta.  4 oz (113 g) cooked dried beans.  4 oz (113 g) starchy vegetable, such as peas, corn, or potatoes.  4 oz (113 g) hot cereal.  4 oz (113 g) mashed potatoes or  of a large baked potato.  4 oz (113 g) canned or frozen fruit.  4 oz (120 mL) fruit juice.  4-6 crackers.  6 chicken nuggets.  6 oz (170 g) unsweetened dry cereal.  6 oz (170 g) plain fat-free yogurt or yogurt sweetened with artificial sweeteners.  8 oz (240 mL) milk.  8 oz (170 g) fresh fruit or one small piece of fruit.  24 oz (680 g) popped popcorn. Example of carbohydrate counting Sample meal  3 oz (85 g) chicken breast.  6 oz (170 g)   brown rice.  4 oz (113 g) corn.  8 oz (240 mL) milk.  8 oz (170 g) strawberries with sugar-free whipped topping. Carbohydrate calculation 1. Identify the foods that contain carbohydrates: ? Rice. ? Corn. ? Milk. ? Strawberries. 2. Calculate how many servings you have of each food: ? 2 servings rice. ? 1 serving corn. ? 1 serving milk. ? 1 serving strawberries. 3. Multiply each number of servings by 15 g: ? 2 servings rice x 15 g = 30 g. ? 1 serving corn x 15 g = 15 g. ? 1 serving milk x 15 g = 15 g. ? 1 serving  strawberries x 15 g = 15 g. 4. Add together all of the amounts to find the total grams of carbohydrates eaten: ? 30 g + 15 g + 15 g + 15 g = 75 g of carbohydrates total. Summary  Carbohydrate counting is a method of keeping track of how many carbohydrates you eat.  Eating carbohydrates naturally increases the amount of sugar (glucose) in the blood.  Counting how many carbohydrates you eat helps keep your blood glucose within normal limits, which helps you manage your diabetes.  A diet and nutrition specialist (registered dietitian) can help you make a meal plan and calculate how many carbohydrates you should have at each meal and snack. This information is not intended to replace advice given to you by your health care provider. Make sure you discuss any questions you have with your health care provider. Document Released: 01/21/2005 Document Revised: 08/15/2016 Document Reviewed: 07/05/2015 Elsevier Patient Education  2020 [employer] DASH Eating Plan DASH stands for "Dietary Approaches to Stop Hypertension." The DASH eating plan is a healthy eating plan that has been shown to reduce high blood pressure (hypertension). It may also reduce your risk for type 2 diabetes, heart disease, and stroke. The DASH eating plan may also help with weight loss. What are tips for following this plan?  General guidelines  Avoid eating more than 2,300 mg (milligrams) of salt (sodium) a day. If you have hypertension, you may need to reduce your sodium intake to 1,500 mg a day.  Limit alcohol intake to no more than 1 drink a day for nonpregnant women and 2 drinks a day for men. One drink equals 12 oz of beer, 5 oz of wine, or 1 oz of hard liquor.  Work with your health care provider to maintain a healthy body weight or to lose weight. Ask what an ideal weight is for you.  Get at least 30 minutes of exercise that causes your heart to beat faster (aerobic exercise) most days of the week. Activities may  include walking, swimming, or biking.  Work with your health care provider or diet and nutrition specialist (dietitian) to adjust your eating plan to your individual calorie needs. Reading food labels   Check food labels for the amount of sodium per serving. Choose foods with less than 5 percent of the Daily Value of sodium. Generally, foods with less than 300 mg of sodium per serving fit into this eating plan.  To find whole grains, look for the word "whole" as the first word in the ingredient list. Shopping  Buy products labeled as "low-sodium" or "no salt added."  Buy fresh foods. Avoid canned foods and premade or frozen meals. Cooking  Avoid adding salt when cooking. Use salt-free seasonings or herbs instead of table salt or sea salt. Check with your health care provider or pharmacist before using salt substitutes.    Do not fry foods. Cook foods using healthy methods such as baking, boiling, grilling, and broiling instead.  Cook with heart-healthy oils, such as olive, canola, soybean, or sunflower oil. Meal planning  Eat a balanced diet that includes: ? 5 or more servings of fruits and vegetables each day. At each meal, try to fill half of your plate with fruits and vegetables. ? Up to 6-8 servings of whole grains each day. ? Less than 6 oz of lean meat, poultry, or fish each day. A 3-oz serving of meat is about the same size as a deck of cards. One egg equals 1 oz. ? 2 servings of low-fat dairy each day. ? A serving of nuts, seeds, or beans 5 times each week. ? Heart-healthy fats. Healthy fats called Omega-3 fatty acids are found in foods such as flaxseeds and coldwater fish, like sardines, salmon, and mackerel.  Limit how much you eat of the following: ? Canned or prepackaged foods. ? Food that is high in trans fat, such as fried foods. ? Food that is high in saturated fat, such as fatty meat. ? Sweets, desserts, sugary drinks, and other foods with added sugar. ? Full-fat  dairy products.  Do not salt foods before eating.  Try to eat at least 2 vegetarian meals each week.  Eat more home-cooked food and less restaurant, buffet, and fast food.  When eating at a restaurant, ask that your food be prepared with less salt or no salt, if possible. What foods are recommended? The items listed may not be a complete list. Talk with your dietitian about what dietary choices are best for you. Grains Whole-grain or whole-wheat bread. Whole-grain or whole-wheat pasta. Brown rice. Oatmeal. Quinoa. Bulgur. Whole-grain and low-sodium cereals. Pita bread. Low-fat, low-sodium crackers. Whole-wheat flour tortillas. Vegetables Fresh or frozen vegetables (raw, steamed, roasted, or grilled). Low-sodium or reduced-sodium tomato and vegetable juice. Low-sodium or reduced-sodium tomato sauce and tomato paste. Low-sodium or reduced-sodium canned vegetables. Fruits All fresh, dried, or frozen fruit. Canned fruit in natural juice (without added sugar). Meat and other protein foods Skinless chicken or turkey. Ground chicken or turkey. Pork with fat trimmed off. Fish and seafood. Egg whites. Dried beans, peas, or lentils. Unsalted nuts, nut butters, and seeds. Unsalted canned beans. Lean cuts of beef with fat trimmed off. Low-sodium, lean deli meat. Dairy Low-fat (1%) or fat-free (skim) milk. Fat-free, low-fat, or reduced-fat cheeses. Nonfat, low-sodium ricotta or cottage cheese. Low-fat or nonfat yogurt. Low-fat, low-sodium cheese. Fats and oils Soft margarine without trans fats. Vegetable oil. Low-fat, reduced-fat, or light mayonnaise and salad dressings (reduced-sodium). Canola, safflower, olive, soybean, and sunflower oils. Avocado. Seasoning and other foods Herbs. Spices. Seasoning mixes without salt. Unsalted popcorn and pretzels. Fat-free sweets. What foods are not recommended? The items listed may not be a complete list. Talk with your dietitian about what dietary choices are best  for you. Grains Baked goods made with fat, such as croissants, muffins, or some breads. Dry pasta or rice meal packs. Vegetables Creamed or fried vegetables. Vegetables in a cheese sauce. Regular canned vegetables (not low-sodium or reduced-sodium). Regular canned tomato sauce and paste (not low-sodium or reduced-sodium). Regular tomato and vegetable juice (not low-sodium or reduced-sodium). Pickles. Olives. Fruits Canned fruit in a light or heavy syrup. Fried fruit. Fruit in cream or butter sauce. Meat and other protein foods Fatty cuts of meat. Ribs. Fried meat. Bacon. Sausage. Bologna and other processed lunch meats. Salami. Fatback. Hotdogs. Bratwurst. Salted nuts and seeds. Canned beans with   added salt. Canned or smoked fish. Whole eggs or egg yolks. Chicken or turkey with skin. Dairy Whole or 2% milk, cream, and half-and-half. Whole or full-fat cream cheese. Whole-fat or sweetened yogurt. Full-fat cheese. Nondairy creamers. Whipped toppings. Processed cheese and cheese spreads. Fats and oils Butter. Stick margarine. Lard. Shortening. Ghee. Bacon fat. Tropical oils, such as coconut, palm kernel, or palm oil. Seasoning and other foods Salted popcorn and pretzels. Onion salt, garlic salt, seasoned salt, table salt, and sea salt. Worcestershire sauce. Tartar sauce. Barbecue sauce. Teriyaki sauce. Soy sauce, including reduced-sodium. Steak sauce. Canned and packaged gravies. Fish sauce. Oyster sauce. Cocktail sauce. Horseradish that you find on the shelf. Ketchup. Mustard. Meat flavorings and tenderizers. Bouillon cubes. Hot sauce and Tabasco sauce. Premade or packaged marinades. Premade or packaged taco seasonings. Relishes. Regular salad dressings. Where to find more information:  National Heart, Lung, and Blood Institute: www.nhlbi.nih.gov  American Heart Association: www.heart.org Summary  The DASH eating plan is a healthy eating plan that has been shown to reduce high blood pressure  (hypertension). It may also reduce your risk for type 2 diabetes, heart disease, and stroke.  With the DASH eating plan, you should limit salt (sodium) intake to 2,300 mg a day. If you have hypertension, you may need to reduce your sodium intake to 1,500 mg a day.  When on the DASH eating plan, aim to eat more fresh fruits and vegetables, whole grains, lean proteins, low-fat dairy, and heart-healthy fats.  Work with your health care provider or diet and nutrition specialist (dietitian) to adjust your eating plan to your individual calorie needs. This information is not intended to replace advice given to you by your health care provider. Make sure you discuss any questions you have with your health care provider. Document Released: 01/10/2011 Document Revised: 01/03/2017 Document Reviewed: 01/15/2016 Elsevier Patient Education  2020 Elsevier Inc.  

## 2018-10-22 NOTE — Progress Notes (Signed)
Patient ID: Nathan Wong, male   DOB: April 28, 1955, 63 y.o.   MRN: [id number]  Chief Complaint  Patient presents with  . Diabetes  . Hypertension  . Peripheral Neuropathy    L worse than r  . Arthritis    HPI Nathan Wong is a 63 y.o. male who presents to establish care and evaluation of his chronic problems. He endorses taking 5 mg Lisinopril daily for hypertension, doesn't check his blood pressure at home. He has type 2 diabetes with neuropathy, he takes 5 mg glipizide daily, Insulin Degludec 20 units q am, Metformin 1000 mg daily, and Gabapentin 100 mg tid. He checks blood glucose once a week and states that his fasting readings are below 200 mg/dl. He performs daily foot check, has not had eye exam and he reports that his peripheral neuropathy is worsening. He takes 50 mcg hypothyroidism daily and has not had TSH done for many months. His blood glucose was checked during visit and it was 246 mg/dl. He states that he's doing well and denies any further concerns.   Past Medical History:  Diagnosis Date  . Arthritis   . Diabetes mellitus without complication ([place])   . H/O hernia repair   . Hypertension     Past Surgical History:  Procedure Laterality Date  . HERNIA REPAIR     abdominal    Family History  Problem Relation Age of Onset  . CAD Mother   . Early death Neg Hx     Social History Social History   Tobacco Use  . Smoking status: Never Smoker  . Smokeless tobacco: Never Used  Substance Use Topics  . Alcohol use: No  . Drug use: No    No Known Allergies  Current Outpatient Medications  Medication Sig Dispense Refill  . acetaminophen (TYLENOL) 500 MG tablet Take 500 mg by mouth every 6 (six) hours as needed for mild pain. Just takes PRN and takes 2 tab qAM.    . cholecalciferol (VITAMIN D3) 25 MCG (1000 UT) tablet Take 1,000 Units by mouth daily.    Derrill Memo ON 10/28/2018] gabapentin (NEURONTIN) 300 MG capsule Take 1 capsule (300 mg total) by mouth 3 (three)  times daily. Pt currently taking 1 cap (100 mg) daily. 90 capsule 0  . [START ON 10/28/2018] glipiZIDE (GLUCOTROL) 10 MG tablet Take 0.5 tablets (5 mg total) by mouth daily. Takes with food. 30 tablet 2  . Insulin Degludec 100 UNIT/ML SOLN Inject 20 Units into the skin every morning.    . insulin glargine (LANTUS) 100 UNIT/ML injection Inject 30 Units into the skin daily. Not taking currently as of 10/05/18.  Taking [place] until he can afford Lantus.    Marland Kitchen levothyroxine (SYNTHROID) 100 MCG tablet Take 50 mcg by mouth daily before breakfast.    . lisinopril (PRINIVIL,ZESTRIL) 5 MG tablet Take 5 mg by mouth daily.   4  . metFORMIN (GLUCOPHAGE) 500 MG tablet Take 1,000 mg by mouth daily with breakfast.   2  . aspirin EC 81 MG tablet Take 81 mg by mouth daily.    . blood glucose meter kit and supplies KIT Dispense based on patient and insurance preference. Use up to four times daily as directed. (FOR ICD-9 250.00, 250.01). 1 each 0   No current facility-administered medications for this visit.     Review of Systems Review of Systems  Constitutional: Negative.   HENT: Negative.   Eyes: Negative.   Respiratory: Negative.   Gastrointestinal: Negative.  Endocrine: Negative.   Genitourinary: Negative.   Musculoskeletal: Negative.   Skin: Negative.   Neurological: Positive for numbness (peripheral neuropathy).  Psychiatric/Behavioral: Negative.     Blood pressure (!) 143/86, pulse 97, height 6' (1.829 m), weight 221 lb (100.2 kg), SpO2 97 %.  Physical Exam Physical Exam Constitutional:      Appearance: Normal appearance.  HENT:     Head: Normocephalic and atraumatic.     Nose: Nose normal.     Mouth/Throat:     Mouth: Mucous membranes are moist.  Eyes:     Extraocular Movements: Extraocular movements intact.     Pupils: Pupils are equal, round, and reactive to light.  Neck:     Musculoskeletal: Normal range of motion.  Cardiovascular:     Rate and Rhythm: Normal rate and regular  rhythm.     Pulses: Normal pulses.     Heart sounds: Normal heart sounds.  Pulmonary:     Effort: Pulmonary effort is normal.     Breath sounds: Normal breath sounds.  Abdominal:     General: Bowel sounds are normal.     Palpations: Abdomen is soft.  Genitourinary:    Comments: deferred per patient. Musculoskeletal: Normal range of motion.  Skin:    General: Skin is warm and dry.  Neurological:     General: No focal deficit present.     Mental Status: He is alert and oriented to person, place, and time. Mental status is at baseline.  Psychiatric:        Mood and Affect: Mood normal.        Behavior: Behavior normal.        Thought Content: Thought content normal.        Judgment: Judgment normal.     Data Reviewed Labs and past medical history was reviewed.  Assessment and Plan 1. Type 2 diabetes mellitus with diabetic neuropathy, unspecified whether long term insulin use ([place]) - He will continue on current treatment regimen, HgbA1c will be obtained. His gabapentin will be increased to 300 mg tid. -Use Diabetic diet as advised  -Check blood sugar 2-times a day, once before breakfast and others 2 hours after lunch or dinner,write down the numbers against date in a log,bring log to clinic every visit -Take medications regularly as advised -Regular exercise as tolerated. - gabapentin (NEURONTIN) 300 MG capsule; Take 1 capsule (300 mg total) by mouth 3 (three) times daily. Pt currently taking 1 cap (100 mg) daily.  Dispense: 90 capsule; Refill: 0 - glipiZIDE (GLUCOTROL) 10 MG tablet; Take 0.5 tablets (5 mg total) by mouth daily. Takes with food.  Dispense: 30 tablet; Refill: 2 - HgB A1c; Future - blood glucose meter kit and supplies KIT; Dispense based on patient and insurance preference. Use up to four times daily as directed. (FOR ICD-9 250.00, 250.01).  Dispense: 1 each; Refill: 0  2. Essential hypertension - He was continue on current treatment regimen, -Low salt DASH  diet -Take medications regularly on time -Exercise regularly as tolerated -Check blood pressure at least once a week at home or a nearby pharmacy and record -Goal is less than 140/90 and normal blood pressure is 120/80    3. Hypothyroidism, unspecified type - He will continue 50 mcg Levothyroxine daily and TSH will be checked .  4. Encounter to establish care Routine labs will be collected. - CBC w/Diff; Future - Comp Met (CMET); Future - TSH; Future - Lipid panel; Future - Urinalysis; Future  Follow up : on  11/10/2018 or if symptom worsens or fail to improve.  Jaquasia Doscher E Margreat Widener 10/24/2018, 2:23 PM

## 2018-10-24 MED ORDER — BLOOD GLUCOSE MONITOR KIT: PACK | 0 refills | Status: DC

## 2018-10-27 ENCOUNTER — Other Ambulatory Visit: Payer: Self-pay | Admitting: Gerontology

## 2018-10-27 DIAGNOSIS — E1140 Type 2 diabetes mellitus with diabetic neuropathy, unspecified: Secondary | ICD-10-CM

## 2018-10-27 MED ORDER — INSULIN DEGLUDEC 200 UNIT/ML ~~LOC~~ SOPN: 20.0000 [IU] | PEN_INJECTOR | Freq: Every day | SUBCUTANEOUS | 3 refills | Status: DC

## 2018-10-28 ENCOUNTER — Other Ambulatory Visit: Payer: Self-pay

## 2018-10-28 DIAGNOSIS — E1140 Type 2 diabetes mellitus with diabetic neuropathy, unspecified: Secondary | ICD-10-CM

## 2018-10-28 DIAGNOSIS — Z7689 Persons encountering health services in other specified circumstances: Secondary | ICD-10-CM

## 2018-10-29 LAB — CBC WITH DIFFERENTIAL/PLATELET
Basophils Absolute: 0.1 10*3/uL (ref 0.0–0.2)
Basos: 1 %
EOS (ABSOLUTE): 0.1 10*3/uL (ref 0.0–0.4)
Eos: 1 %
Hematocrit: 47.4 % (ref 37.5–51.0)
Hemoglobin: 16.3 g/dL (ref 13.0–17.7)
Immature Grans (Abs): 0.1 10*3/uL (ref 0.0–0.1)
Immature Granulocytes: 1 %
Lymphocytes Absolute: 1.8 10*3/uL (ref 0.7–3.1)
Lymphs: 21 %
MCH: 30.8 pg (ref 26.6–33.0)
MCHC: 34.4 g/dL (ref 31.5–35.7)
MCV: 89 fL (ref 79–97)
Monocytes Absolute: 0.9 10*3/uL (ref 0.1–0.9)
Monocytes: 10 %
Neutrophils Absolute: 5.9 10*3/uL (ref 1.4–7.0)
Neutrophils: 66 %
Platelets: 225 10*3/uL (ref 150–450)
RBC: 5.3 x10E6/uL (ref 4.14–5.80)
RDW: 12.8 % (ref 11.6–15.4)
WBC: 8.9 10*3/uL (ref 3.4–10.8)

## 2018-10-29 LAB — URINALYSIS
Bilirubin, UA: NEGATIVE
Ketones, UA: NEGATIVE
Leukocytes,UA: NEGATIVE
Nitrite, UA: NEGATIVE
Protein,UA: NEGATIVE
RBC, UA: NEGATIVE
Specific Gravity, UA: 1.015 (ref 1.005–1.030)
Urobilinogen, Ur: 2 mg/dL — ABNORMAL HIGH (ref 0.2–1.0)
pH, UA: 5.5 (ref 5.0–7.5)

## 2018-10-29 LAB — COMPREHENSIVE METABOLIC PANEL
ALT: 12 IU/L (ref 0–44)
AST: 18 IU/L (ref 0–40)
Albumin/Globulin Ratio: 1.5 (ref 1.2–2.2)
Albumin: 4.7 g/dL (ref 3.8–4.8)
Alkaline Phosphatase: 101 IU/L (ref 39–117)
BUN/Creatinine Ratio: 14 (ref 10–24)
BUN: 14 mg/dL (ref 8–27)
Bilirubin Total: 0.7 mg/dL (ref 0.0–1.2)
CO2: 24 mmol/L (ref 20–29)
Calcium: 9.9 mg/dL (ref 8.6–10.2)
Chloride: 95 mmol/L — ABNORMAL LOW (ref 96–106)
Creatinine, Ser: 1.03 mg/dL (ref 0.76–1.27)
GFR calc Af Amer: 88 mL/min/{1.73_m2} (ref >59)
GFR calc non Af Amer: 76 mL/min/{1.73_m2} (ref >59)
Globulin, Total: 3.1 g/dL (ref 1.5–4.5)
Glucose: 223 mg/dL — ABNORMAL HIGH (ref 65–99)
Potassium: 4.8 mmol/L (ref 3.5–5.2)
Sodium: 138 mmol/L (ref 134–144)
Total Protein: 7.8 g/dL (ref 6.0–8.5)

## 2018-10-29 LAB — HEMOGLOBIN A1C
Est. average glucose Bld gHb Est-mCnc: 137 mg/dL
Hgb A1c MFr Bld: 6.4 % — ABNORMAL HIGH (ref 4.8–5.6)

## 2018-10-29 LAB — LIPID PANEL
Chol/HDL Ratio: 4.9 ratio (ref 0.0–5.0)
Cholesterol, Total: 188 mg/dL (ref 100–199)
HDL: 38 mg/dL — ABNORMAL LOW (ref >39)
LDL Chol Calc (NIH): 87 mg/dL (ref 0–99)
Triglycerides: 387 mg/dL — ABNORMAL HIGH (ref 0–149)
VLDL Cholesterol Cal: 63 mg/dL — ABNORMAL HIGH (ref 5–40)

## 2018-10-29 LAB — TSH: TSH: 2.94 u[IU]/mL (ref 0.450–4.500)

## 2018-11-06 ENCOUNTER — Telehealth: Payer: Self-pay | Admitting: Pharmacist

## 2018-11-06 NOTE — Telephone Encounter (Signed)
11/06/2018 3:03:23 PM - Nathan Wong U100 Flextouch & tips  11/06/2018 Received pharmacy printout from [facility] U100 Flextouch Inject 20 units under the skin daily at bedtime #2 boxes & Novofine 32G tips to use daily with Nathan Wong U100 Flextouch. [name] has note to order this product since patient has been getting samples of this products to replace Lantus. Printed [employer] application-sending provider portion to [facility] for [place] to sign, mailing patient his portion to sign & return.Delos Haring

## 2018-11-10 ENCOUNTER — Ambulatory Visit: Payer: Self-pay | Admitting: Gerontology

## 2018-11-10 ENCOUNTER — Other Ambulatory Visit: Payer: Self-pay

## 2018-11-10 VITALS — BP 146/86 | HR 110 | Temp 98.2°F | Ht 72.0 in | Wt 220.0 lb

## 2018-11-10 DIAGNOSIS — R000 Tachycardia, unspecified: Secondary | ICD-10-CM | POA: Insufficient documentation

## 2018-11-10 DIAGNOSIS — E039 Hypothyroidism, unspecified: Secondary | ICD-10-CM

## 2018-11-10 DIAGNOSIS — E1140 Type 2 diabetes mellitus with diabetic neuropathy, unspecified: Secondary | ICD-10-CM

## 2018-11-10 DIAGNOSIS — I10 Essential (primary) hypertension: Secondary | ICD-10-CM

## 2018-11-10 DIAGNOSIS — Z0000 Encounter for general adult medical examination without abnormal findings: Secondary | ICD-10-CM

## 2018-11-10 MED ORDER — GABAPENTIN 400 MG PO CAPS: 400.0000 mg | ORAL_CAPSULE | Freq: Three times a day (TID) | ORAL | 0 refills | Status: DC

## 2018-11-10 MED ORDER — LISINOPRIL 10 MG PO TABS: 10.0000 mg | ORAL_TABLET | Freq: Every day | ORAL | 3 refills | Status: DC

## 2018-11-10 MED ORDER — LEVOTHYROXINE SODIUM 100 MCG PO TABS: 50.0000 ug | ORAL_TABLET | Freq: Every day | ORAL | 3 refills | Status: DC

## 2018-11-10 NOTE — Patient Instructions (Signed)
Carbohydrate Counting for Diabetes Mellitus, Adult  Carbohydrate counting is a method of keeping track of how many carbohydrates you eat. Eating carbohydrates naturally increases the amount of sugar (glucose) in the blood. Counting how many carbohydrates you eat helps keep your blood glucose within normal limits, which helps you manage your diabetes (diabetes mellitus). It is important to know how many carbohydrates you can safely have in each meal. This is different for every person. A diet and nutrition specialist (registered dietitian) can help you make a meal plan and calculate how many carbohydrates you should have at each meal and snack. Carbohydrates are found in the following foods:  Grains, such as breads and cereals.  Dried beans and soy products.  Starchy vegetables, such as potatoes, peas, and corn.  Fruit and fruit juices.  Milk and yogurt.  Sweets and snack foods, such as cake, cookies, candy, chips, and soft drinks. How do I count carbohydrates? There are two ways to count carbohydrates in food. You can use either of the methods or a combination of both. Reading "Nutrition Facts" on packaged food The "Nutrition Facts" list is included on the labels of almost all packaged foods and beverages in the [place] It includes:  The serving size.  Information about nutrients in each serving, including the grams (g) of carbohydrate per serving. To use the "Nutrition Facts":  Decide how many servings you will have.  Multiply the number of servings by the number of carbohydrates per serving.  The resulting number is the total amount of carbohydrates that you will be having. Learning standard serving sizes of other foods When you eat carbohydrate foods that are not packaged or do not include "Nutrition Facts" on the label, you need to measure the servings in order to count the amount of carbohydrates:  Measure the foods that you will eat with a food scale or measuring cup, if needed.   Decide how many standard-size servings you will eat.  Multiply the number of servings by 15. Most carbohydrate-rich foods have about 15 g of carbohydrates per serving. ? For example, if you eat 8 oz (170 g) of strawberries, you will have eaten 2 servings and 30 g of carbohydrates (2 servings x 15 g = 30 g).  For foods that have more than one food mixed, such as soups and casseroles, you must count the carbohydrates in each food that is included. The following list contains standard serving sizes of common carbohydrate-rich foods. Each of these servings has about 15 g of carbohydrates:   hamburger bun or  English muffin.   oz (15 mL) syrup.   oz (14 g) jelly.  1 slice of bread.  1 six-inch tortilla.  3 oz (85 g) cooked rice or pasta.  4 oz (113 g) cooked dried beans.  4 oz (113 g) starchy vegetable, such as peas, corn, or potatoes.  4 oz (113 g) hot cereal.  4 oz (113 g) mashed potatoes or  of a large baked potato.  4 oz (113 g) canned or frozen fruit.  4 oz (120 mL) fruit juice.  4-6 crackers.  6 chicken nuggets.  6 oz (170 g) unsweetened dry cereal.  6 oz (170 g) plain fat-free yogurt or yogurt sweetened with artificial sweeteners.  8 oz (240 mL) milk.  8 oz (170 g) fresh fruit or one small piece of fruit.  24 oz (680 g) popped popcorn. Example of carbohydrate counting Sample meal  3 oz (85 g) chicken breast.  6 oz (170 g)   brown rice.  4 oz (113 g) corn.  8 oz (240 mL) milk.  8 oz (170 g) strawberries with sugar-free whipped topping. Carbohydrate calculation 1. Identify the foods that contain carbohydrates: ? Rice. ? Corn. ? Milk. ? Strawberries. 2. Calculate how many servings you have of each food: ? 2 servings rice. ? 1 serving corn. ? 1 serving milk. ? 1 serving strawberries. 3. Multiply each number of servings by 15 g: ? 2 servings rice x 15 g = 30 g. ? 1 serving corn x 15 g = 15 g. ? 1 serving milk x 15 g = 15 g. ? 1 serving  strawberries x 15 g = 15 g. 4. Add together all of the amounts to find the total grams of carbohydrates eaten: ? 30 g + 15 g + 15 g + 15 g = 75 g of carbohydrates total. Summary  Carbohydrate counting is a method of keeping track of how many carbohydrates you eat.  Eating carbohydrates naturally increases the amount of sugar (glucose) in the blood.  Counting how many carbohydrates you eat helps keep your blood glucose within normal limits, which helps you manage your diabetes.  A diet and nutrition specialist (registered dietitian) can help you make a meal plan and calculate how many carbohydrates you should have at each meal and snack. This information is not intended to replace advice given to you by your health care provider. Make sure you discuss any questions you have with your health care provider. Document Released: 01/21/2005 Document Revised: 08/15/2016 Document Reviewed: 07/05/2015 Elsevier Patient Education  2020 [employer] DASH Eating Plan DASH stands for "Dietary Approaches to Stop Hypertension." The DASH eating plan is a healthy eating plan that has been shown to reduce high blood pressure (hypertension). It may also reduce your risk for type 2 diabetes, heart disease, and stroke. The DASH eating plan may also help with weight loss. What are tips for following this plan?  General guidelines  Avoid eating more than 2,300 mg (milligrams) of salt (sodium) a day. If you have hypertension, you may need to reduce your sodium intake to 1,500 mg a day.  Limit alcohol intake to no more than 1 drink a day for nonpregnant women and 2 drinks a day for men. One drink equals 12 oz of beer, 5 oz of wine, or 1 oz of hard liquor.  Work with your health care provider to maintain a healthy body weight or to lose weight. Ask what an ideal weight is for you.  Get at least 30 minutes of exercise that causes your heart to beat faster (aerobic exercise) most days of the week. Activities may  include walking, swimming, or biking.  Work with your health care provider or diet and nutrition specialist (dietitian) to adjust your eating plan to your individual calorie needs. Reading food labels   Check food labels for the amount of sodium per serving. Choose foods with less than 5 percent of the Daily Value of sodium. Generally, foods with less than 300 mg of sodium per serving fit into this eating plan.  To find whole grains, look for the word "whole" as the first word in the ingredient list. Shopping  Buy products labeled as "low-sodium" or "no salt added."  Buy fresh foods. Avoid canned foods and premade or frozen meals. Cooking  Avoid adding salt when cooking. Use salt-free seasonings or herbs instead of table salt or sea salt. Check with your health care provider or pharmacist before using salt substitutes.    Do not fry foods. Cook foods using healthy methods such as baking, boiling, grilling, and broiling instead.  Cook with heart-healthy oils, such as olive, canola, soybean, or sunflower oil. Meal planning  Eat a balanced diet that includes: ? 5 or more servings of fruits and vegetables each day. At each meal, try to fill half of your plate with fruits and vegetables. ? Up to 6-8 servings of whole grains each day. ? Less than 6 oz of lean meat, poultry, or fish each day. A 3-oz serving of meat is about the same size as a deck of cards. One egg equals 1 oz. ? 2 servings of low-fat dairy each day. ? A serving of nuts, seeds, or beans 5 times each week. ? Heart-healthy fats. Healthy fats called Omega-3 fatty acids are found in foods such as flaxseeds and coldwater fish, like sardines, salmon, and mackerel.  Limit how much you eat of the following: ? Canned or prepackaged foods. ? Food that is high in trans fat, such as fried foods. ? Food that is high in saturated fat, such as fatty meat. ? Sweets, desserts, sugary drinks, and other foods with added sugar. ? Full-fat  dairy products.  Do not salt foods before eating.  Try to eat at least 2 vegetarian meals each week.  Eat more home-cooked food and less restaurant, buffet, and fast food.  When eating at a restaurant, ask that your food be prepared with less salt or no salt, if possible. What foods are recommended? The items listed may not be a complete list. Talk with your dietitian about what dietary choices are best for you. Grains Whole-grain or whole-wheat bread. Whole-grain or whole-wheat pasta. Brown rice. Oatmeal. Quinoa. Bulgur. Whole-grain and low-sodium cereals. Pita bread. Low-fat, low-sodium crackers. Whole-wheat flour tortillas. Vegetables Fresh or frozen vegetables (raw, steamed, roasted, or grilled). Low-sodium or reduced-sodium tomato and vegetable juice. Low-sodium or reduced-sodium tomato sauce and tomato paste. Low-sodium or reduced-sodium canned vegetables. Fruits All fresh, dried, or frozen fruit. Canned fruit in natural juice (without added sugar). Meat and other protein foods Skinless chicken or turkey. Ground chicken or turkey. Pork with fat trimmed off. Fish and seafood. Egg whites. Dried beans, peas, or lentils. Unsalted nuts, nut butters, and seeds. Unsalted canned beans. Lean cuts of beef with fat trimmed off. Low-sodium, lean deli meat. Dairy Low-fat (1%) or fat-free (skim) milk. Fat-free, low-fat, or reduced-fat cheeses. Nonfat, low-sodium ricotta or cottage cheese. Low-fat or nonfat yogurt. Low-fat, low-sodium cheese. Fats and oils Soft margarine without trans fats. Vegetable oil. Low-fat, reduced-fat, or light mayonnaise and salad dressings (reduced-sodium). Canola, safflower, olive, soybean, and sunflower oils. Avocado. Seasoning and other foods Herbs. Spices. Seasoning mixes without salt. Unsalted popcorn and pretzels. Fat-free sweets. What foods are not recommended? The items listed may not be a complete list. Talk with your dietitian about what dietary choices are best  for you. Grains Baked goods made with fat, such as croissants, muffins, or some breads. Dry pasta or rice meal packs. Vegetables Creamed or fried vegetables. Vegetables in a cheese sauce. Regular canned vegetables (not low-sodium or reduced-sodium). Regular canned tomato sauce and paste (not low-sodium or reduced-sodium). Regular tomato and vegetable juice (not low-sodium or reduced-sodium). Pickles. Olives. Fruits Canned fruit in a light or heavy syrup. Fried fruit. Fruit in cream or butter sauce. Meat and other protein foods Fatty cuts of meat. Ribs. Fried meat. Bacon. Sausage. Bologna and other processed lunch meats. Salami. Fatback. Hotdogs. Bratwurst. Salted nuts and seeds. Canned beans with   added salt. Canned or smoked fish. Whole eggs or egg yolks. Chicken or turkey with skin. Dairy Whole or 2% milk, cream, and half-and-half. Whole or full-fat cream cheese. Whole-fat or sweetened yogurt. Full-fat cheese. Nondairy creamers. Whipped toppings. Processed cheese and cheese spreads. Fats and oils Butter. Stick margarine. Lard. Shortening. Ghee. Bacon fat. Tropical oils, such as coconut, palm kernel, or palm oil. Seasoning and other foods Salted popcorn and pretzels. Onion salt, garlic salt, seasoned salt, table salt, and sea salt. Worcestershire sauce. Tartar sauce. Barbecue sauce. Teriyaki sauce. Soy sauce, including reduced-sodium. Steak sauce. Canned and packaged gravies. Fish sauce. Oyster sauce. Cocktail sauce. Horseradish that you find on the shelf. Ketchup. Mustard. Meat flavorings and tenderizers. Bouillon cubes. Hot sauce and Tabasco sauce. Premade or packaged marinades. Premade or packaged taco seasonings. Relishes. Regular salad dressings. Where to find more information:  National Heart, Lung, and Blood Institute: www.nhlbi.nih.gov  American Heart Association: www.heart.org Summary  The DASH eating plan is a healthy eating plan that has been shown to reduce high blood pressure  (hypertension). It may also reduce your risk for type 2 diabetes, heart disease, and stroke.  With the DASH eating plan, you should limit salt (sodium) intake to 2,300 mg a day. If you have hypertension, you may need to reduce your sodium intake to 1,500 mg a day.  When on the DASH eating plan, aim to eat more fresh fruits and vegetables, whole grains, lean proteins, low-fat dairy, and heart-healthy fats.  Work with your health care provider or diet and nutrition specialist (dietitian) to adjust your eating plan to your individual calorie needs. This information is not intended to replace advice given to you by your health care provider. Make sure you discuss any questions you have with your health care provider. Document Released: 01/10/2011 Document Revised: 01/03/2017 Document Reviewed: 01/15/2016 Elsevier Patient Education  2020 Elsevier Inc.  

## 2018-11-10 NOTE — Progress Notes (Signed)
Established Patient Office Visit  Subjective:  Patient ID: Nathan Wong, male    DOB: 12/11/1955  Age: 63 y.o. MRN: [id number]  CC:  Chief Complaint  Patient presents with  . Leg Pain    bilateral, aches, tingles, burns, aches ongoing about a week  . Hypertension  . Diabetes    HPI CHILTON SALLADE presents for follow up of hypertension, type 2 diabetes with neuropathy, hypothyroidism and lab review. His HgbA1c done on 10/28/2018 was 6.4%, he checks his blood glucose twice daily and his fasting blood glucose reading from 10/23/18 to 11/10/18 ranges between 113 mg/dl - 156 mg/dl, and his post dinner readings ranges between 125 mg/dl - 248 mg/dl. He's compliant with his medications, c/o an episode of hypoglycemic symptoms. He performs daily food checks, adheres to diabetic diet and exercises as tolerated. He c/o worsening peripheral neuropathy to left foot and experiences minimal relief from taking 300 mg of Gabapentin tid.  His Lipid panel done on 10/28/2018, Triglyceride was 387, HDL 38, LDL and Total cholesterol were within normal limits. His TSH was within normal limits. Negative monofilament test to left foot. His heart rate was 110 bpm during visit, he denies chest pain, palpitation, dizziness and shortness of breath.  He reports that he's doing well and offers no further concerns.  Past Medical History:  Diagnosis Date  . Arthritis   . Diabetes mellitus without complication ([place])   . H/O hernia repair   . Hypertension     Past Surgical History:  Procedure Laterality Date  . HERNIA REPAIR     abdominal    Family History  Problem Relation Age of Onset  . CAD Mother   . Early death Neg Hx     Social History   Socioeconomic History  . Marital status: Single    Spouse name: Not on file  . Number of children: Not on file  . Years of education: Not on file  . Highest education level: Not on file  Occupational History  . Not on file  Social Needs  . Financial resource  strain: Not on file  . Food insecurity    Worry: Not on file    Inability: Not on file  . Transportation needs    Medical: Not on file    Non-medical: Not on file  Tobacco Use  . Smoking status: Never Smoker  . Smokeless tobacco: Never Used  Substance and Sexual Activity  . Alcohol use: No  . Drug use: No  . Sexual activity: Not on file  Lifestyle  . Physical activity    Days per week: Not on file    Minutes per session: Not on file  . Stress: Not on file  Relationships  . Social Herbalist on phone: Not on file    Gets together: Not on file    Attends religious service: Not on file    Active member of club or organization: Not on file    Attends meetings of clubs or organizations: Not on file    Relationship status: Not on file  . Intimate partner violence    Fear of current or ex partner: Not on file    Emotionally abused: Not on file    Physically abused: Not on file    Forced sexual activity: Not on file  Other Topics Concern  . Not on file  Social History Narrative  . Not on file    Outpatient Medications Prior to Visit  Medication  Sig Dispense Refill  . acetaminophen (TYLENOL) 500 MG tablet Take 500 mg by mouth every 6 (six) hours as needed for mild pain. Just takes PRN and takes 2 tab qAM.    . aspirin EC 81 MG tablet Take 81 mg by mouth daily.    . cholecalciferol (VITAMIN D3) 25 MCG (1000 UT) tablet Take 1,000 Units by mouth daily.    . Insulin Degludec 200 UNIT/ML SOPN Inject 20 Units into the skin at bedtime. 5 pen 3  . metFORMIN (GLUCOPHAGE) 500 MG tablet Take 1,000 mg by mouth daily with breakfast.   2  . gabapentin (NEURONTIN) 300 MG capsule Take 1 capsule (300 mg total) by mouth 3 (three) times daily. Pt currently taking 1 cap (100 mg) daily. (Patient taking differently: Take 300 mg by mouth 3 (three) times daily. ) 90 capsule 0  . glipiZIDE (GLUCOTROL) 10 MG tablet Take 0.5 tablets (5 mg total) by mouth daily. Takes with food. 30 tablet 2  .  levothyroxine (SYNTHROID) 100 MCG tablet Take 50 mcg by mouth daily before breakfast.    . lisinopril (PRINIVIL,ZESTRIL) 5 MG tablet Take 5 mg by mouth daily.   4  . blood glucose meter kit and supplies KIT Dispense based on patient and insurance preference. Use up to four times daily as directed. (FOR ICD-9 250.00, 250.01). 1 each 0   No facility-administered medications prior to visit.     No Known Allergies  ROS Review of Systems  Constitutional: Negative.   Eyes: Negative.   Respiratory: Negative.   Cardiovascular: Negative.   Gastrointestinal: Negative.   Endocrine: Negative.   Genitourinary: Negative.   Skin: Negative.   Neurological: Positive for numbness (worsening left peripheral neuropathy).  Psychiatric/Behavioral: Negative.       Objective:    Physical Exam  Constitutional: He is oriented to person, place, and time. He appears well-developed.  HENT:  Head: Normocephalic and atraumatic.  Eyes: Pupils are equal, round, and reactive to light. EOM are normal.  Cardiovascular: Tachycardia present.  Pulmonary/Chest: Effort normal and breath sounds normal.  Neurological: He is alert and oriented to person, place, and time.  Skin: Skin is warm and dry.  Psychiatric: He has a normal mood and affect. His behavior is normal. Judgment and thought content normal.    BP (!) 146/86 (BP Location: Left Arm, Patient Position: Sitting)   Pulse (!) 110   Temp 98.2 F (36.8 C)   Ht 6' (1.829 m)   Wt 220 lb (99.8 kg)   SpO2 96%   BMI 29.84 kg/m  Wt Readings from Last 3 Encounters:  11/10/18 220 lb (99.8 kg)  10/22/18 221 lb (100.2 kg)  06/01/18 223 lb (101.2 kg)     Health Maintenance Due  Topic Date Due  . Hepatitis C Screening  [birth year]  . OPHTHALMOLOGY EXAM  08/03/1964  . HIV Screening  08/03/1969  . TETANUS/TDAP  08/03/1973  . COLONOSCOPY  08/03/2004    There are no preventive care reminders to display for this patient.  Lab Results  Component Value Date    TSH 2.940 10/28/2018   Lab Results  Component Value Date   WBC 8.9 10/28/2018   HGB 16.3 10/28/2018   HCT 47.4 10/28/2018   MCV 89 10/28/2018   PLT 225 10/28/2018   Lab Results  Component Value Date   NA 138 10/28/2018   K 4.8 10/28/2018   CO2 24 10/28/2018   GLUCOSE 223 (H) 10/28/2018   BUN 14 10/28/2018   CREATININE  1.03 10/28/2018   BILITOT 0.7 10/28/2018   ALKPHOS 101 10/28/2018   AST 18 10/28/2018   ALT 12 10/28/2018   PROT 7.8 10/28/2018   ALBUMIN 4.7 10/28/2018   CALCIUM 9.9 10/28/2018   ANIONGAP 10 01/04/2016   Lab Results  Component Value Date   CHOL 188 10/28/2018   Lab Results  Component Value Date   HDL 38 (L) 10/28/2018   Lab Results  Component Value Date   LDLCALC 87 10/28/2018   Lab Results  Component Value Date   TRIG 387 (H) 10/28/2018   Lab Results  Component Value Date   CHOLHDL 4.9 10/28/2018   Lab Results  Component Value Date   HGBA1C 6.4 (H) 10/28/2018      Assessment & Plan:     1. Type 2 diabetes mellitus with diabetic neuropathy, unspecified whether long term insulin use (HCC) - His HgbA1c improved greatly to 6.4%, and his 5 mg glipizide was discontinued due to hypoglycemia. He will continue on other medications, and Gabapentin was increased to 400 mg tid. He was encouraged to check blood glucose twice daily, record and bring log to follow up visit. He was advised to continue on low carb/ non concentrated sweet diet. - gabapentin (NEURONTIN) 400 MG capsule; Take 1 capsule (400 mg total) by mouth 3 (three) times daily. Pt currently taking 1 cap (100 mg) daily.  Dispense: 90 capsule; Refill: 0  2. Essential hypertension - His blood pressure is controlled, and he will continue on -Low salt DASH diet -Take medications regularly on time -Exercise regularly as tolerated -Check blood pressure at least once a week at home or a nearby pharmacy and record -Goal is less than 140/90 and normal blood pressure is 120/80 - lisinopril  (ZESTRIL) 10 MG tablet; Take 1 tablet (10 mg total) by mouth daily.  Dispense: 30 tablet; Refill: 3  3. Hypothyroidism, unspecified type - He is TSH is within normal limits and he will continue on current treatment regimen. - levothyroxine (SYNTHROID) 100 MCG tablet; Take 0.5 tablets (50 mcg total) by mouth daily before breakfast.  Dispense: 30 tablet; Refill: 3  4. Health care maintenance - He was encouraged to complete charity care application for - Ambulatory referral to Podiatry for worsening neuropathy to left foot and  - Ambulatory referral to Gastroenterology for colonoscopy screening.  5. Increased heart rate - His heart rate was 110 bpm, he was advised to notify clinic or got to the ED for chest pain, worsening palpitation and shortness of breath.    Follow-up: Return in about 4 weeks (around 12/08/2018), or if symptoms worsen or fail to improve.    Tanmay Halteman Jerold Coombe, NP

## 2018-11-12 ENCOUNTER — Telehealth: Payer: Self-pay | Admitting: Pharmacist

## 2018-11-12 NOTE — Telephone Encounter (Signed)
11/12/2018 10:16:53 AM - Tyler Aas U-100 & tips pending  11/12/2018 I have received the signed portion of [employer] application for Tresiba U-100 Inject 20 units daily #2 boxes & tips, holding for patient to return his portion--mailed to patient 11/06/2018.Delos Haring

## 2018-12-01 ENCOUNTER — Other Ambulatory Visit: Payer: Self-pay

## 2018-12-01 DIAGNOSIS — E1140 Type 2 diabetes mellitus with diabetic neuropathy, unspecified: Secondary | ICD-10-CM

## 2018-12-01 MED ORDER — GABAPENTIN 400 MG PO CAPS: 400.0000 mg | ORAL_CAPSULE | Freq: Three times a day (TID) | ORAL | 0 refills | Status: DC

## 2018-12-08 ENCOUNTER — Ambulatory Visit: Payer: Self-pay | Admitting: Gerontology

## 2018-12-08 ENCOUNTER — Encounter: Payer: Self-pay | Admitting: Gerontology

## 2018-12-08 ENCOUNTER — Other Ambulatory Visit: Payer: Self-pay

## 2018-12-08 VITALS — BP 131/69 | HR 90 | Ht 72.0 in | Wt 223.0 lb

## 2018-12-08 DIAGNOSIS — Z0000 Encounter for general adult medical examination without abnormal findings: Secondary | ICD-10-CM

## 2018-12-08 DIAGNOSIS — E1140 Type 2 diabetes mellitus with diabetic neuropathy, unspecified: Secondary | ICD-10-CM

## 2018-12-08 DIAGNOSIS — I10 Essential (primary) hypertension: Secondary | ICD-10-CM

## 2018-12-08 DIAGNOSIS — E039 Hypothyroidism, unspecified: Secondary | ICD-10-CM

## 2018-12-08 MED ORDER — INSULIN DEGLUDEC 200 UNIT/ML ~~LOC~~ SOPN: 20.0000 [IU] | PEN_INJECTOR | Freq: Every day | SUBCUTANEOUS | 4 refills | Status: DC

## 2018-12-08 MED ORDER — LEVOTHYROXINE SODIUM 100 MCG PO TABS: 50.0000 ug | ORAL_TABLET | Freq: Every day | ORAL | 5 refills | Status: DC

## 2018-12-08 MED ORDER — LISINOPRIL 10 MG PO TABS: 10.0000 mg | ORAL_TABLET | Freq: Every day | ORAL | 5 refills | Status: DC

## 2018-12-08 MED ORDER — BLOOD PRESSURE KIT: 1.0000 | PACK | Freq: Every day | 0 refills | Status: DC

## 2018-12-08 MED ORDER — GABAPENTIN 400 MG PO CAPS: 400.0000 mg | ORAL_CAPSULE | Freq: Three times a day (TID) | ORAL | 5 refills | Status: DC

## 2018-12-08 MED ORDER — METFORMIN HCL 500 MG PO TABS: 1000.0000 mg | ORAL_TABLET | Freq: Two times a day (BID) | ORAL | 5 refills | Status: DC

## 2018-12-08 NOTE — Patient Instructions (Signed)
Carbohydrate Counting for Diabetes Mellitus, Adult  Carbohydrate counting is a method of keeping track of how many carbohydrates you eat. Eating carbohydrates naturally increases the amount of sugar (glucose) in the blood. Counting how many carbohydrates you eat helps keep your blood glucose within normal limits, which helps you manage your diabetes (diabetes mellitus). It is important to know how many carbohydrates you can safely have in each meal. This is different for every person. A diet and nutrition specialist (registered dietitian) can help you make a meal plan and calculate how many carbohydrates you should have at each meal and snack. Carbohydrates are found in the following foods:  Grains, such as breads and cereals.  Dried beans and soy products.  Starchy vegetables, such as potatoes, peas, and corn.  Fruit and fruit juices.  Milk and yogurt.  Sweets and snack foods, such as cake, cookies, candy, chips, and soft drinks. How do I count carbohydrates? There are two ways to count carbohydrates in food. You can use either of the methods or a combination of both. Reading "Nutrition Facts" on packaged food The "Nutrition Facts" list is included on the labels of almost all packaged foods and beverages in the [place] It includes:  The serving size.  Information about nutrients in each serving, including the grams (g) of carbohydrate per serving. To use the "Nutrition Facts":  Decide how many servings you will have.  Multiply the number of servings by the number of carbohydrates per serving.  The resulting number is the total amount of carbohydrates that you will be having. Learning standard serving sizes of other foods When you eat carbohydrate foods that are not packaged or do not include "Nutrition Facts" on the label, you need to measure the servings in order to count the amount of carbohydrates:  Measure the foods that you will eat with a food scale or measuring cup, if needed.   Decide how many standard-size servings you will eat.  Multiply the number of servings by 15. Most carbohydrate-rich foods have about 15 g of carbohydrates per serving. ? For example, if you eat 8 oz (170 g) of strawberries, you will have eaten 2 servings and 30 g of carbohydrates (2 servings x 15 g = 30 g).  For foods that have more than one food mixed, such as soups and casseroles, you must count the carbohydrates in each food that is included. The following list contains standard serving sizes of common carbohydrate-rich foods. Each of these servings has about 15 g of carbohydrates:   hamburger bun or  English muffin.   oz (15 mL) syrup.   oz (14 g) jelly.  1 slice of bread.  1 six-inch tortilla.  3 oz (85 g) cooked rice or pasta.  4 oz (113 g) cooked dried beans.  4 oz (113 g) starchy vegetable, such as peas, corn, or potatoes.  4 oz (113 g) hot cereal.  4 oz (113 g) mashed potatoes or  of a large baked potato.  4 oz (113 g) canned or frozen fruit.  4 oz (120 mL) fruit juice.  4-6 crackers.  6 chicken nuggets.  6 oz (170 g) unsweetened dry cereal.  6 oz (170 g) plain fat-free yogurt or yogurt sweetened with artificial sweeteners.  8 oz (240 mL) milk.  8 oz (170 g) fresh fruit or one small piece of fruit.  24 oz (680 g) popped popcorn. Example of carbohydrate counting Sample meal  3 oz (85 g) chicken breast.  6 oz (170 g)   brown rice.  4 oz (113 g) corn.  8 oz (240 mL) milk.  8 oz (170 g) strawberries with sugar-free whipped topping. Carbohydrate calculation 1. Identify the foods that contain carbohydrates: ? Rice. ? Corn. ? Milk. ? Strawberries. 2. Calculate how many servings you have of each food: ? 2 servings rice. ? 1 serving corn. ? 1 serving milk. ? 1 serving strawberries. 3. Multiply each number of servings by 15 g: ? 2 servings rice x 15 g = 30 g. ? 1 serving corn x 15 g = 15 g. ? 1 serving milk x 15 g = 15 g. ? 1 serving  strawberries x 15 g = 15 g. 4. Add together all of the amounts to find the total grams of carbohydrates eaten: ? 30 g + 15 g + 15 g + 15 g = 75 g of carbohydrates total. Summary  Carbohydrate counting is a method of keeping track of how many carbohydrates you eat.  Eating carbohydrates naturally increases the amount of sugar (glucose) in the blood.  Counting how many carbohydrates you eat helps keep your blood glucose within normal limits, which helps you manage your diabetes.  A diet and nutrition specialist (registered dietitian) can help you make a meal plan and calculate how many carbohydrates you should have at each meal and snack. This information is not intended to replace advice given to you by your health care provider. Make sure you discuss any questions you have with your health care provider. Document Released: 01/21/2005 Document Revised: 08/15/2016 Document Reviewed: 07/05/2015 Elsevier Patient Education  2020 [employer] DASH Eating Plan DASH stands for "Dietary Approaches to Stop Hypertension." The DASH eating plan is a healthy eating plan that has been shown to reduce high blood pressure (hypertension). It may also reduce your risk for type 2 diabetes, heart disease, and stroke. The DASH eating plan may also help with weight loss. What are tips for following this plan?  General guidelines  Avoid eating more than 2,300 mg (milligrams) of salt (sodium) a day. If you have hypertension, you may need to reduce your sodium intake to 1,500 mg a day.  Limit alcohol intake to no more than 1 drink a day for nonpregnant women and 2 drinks a day for men. One drink equals 12 oz of beer, 5 oz of wine, or 1 oz of hard liquor.  Work with your health care provider to maintain a healthy body weight or to lose weight. Ask what an ideal weight is for you.  Get at least 30 minutes of exercise that causes your heart to beat faster (aerobic exercise) most days of the week. Activities may  include walking, swimming, or biking.  Work with your health care provider or diet and nutrition specialist (dietitian) to adjust your eating plan to your individual calorie needs. Reading food labels   Check food labels for the amount of sodium per serving. Choose foods with less than 5 percent of the Daily Value of sodium. Generally, foods with less than 300 mg of sodium per serving fit into this eating plan.  To find whole grains, look for the word "whole" as the first word in the ingredient list. Shopping  Buy products labeled as "low-sodium" or "no salt added."  Buy fresh foods. Avoid canned foods and premade or frozen meals. Cooking  Avoid adding salt when cooking. Use salt-free seasonings or herbs instead of table salt or sea salt. Check with your health care provider or pharmacist before using salt substitutes.    Do not fry foods. Cook foods using healthy methods such as baking, boiling, grilling, and broiling instead.  Cook with heart-healthy oils, such as olive, canola, soybean, or sunflower oil. Meal planning  Eat a balanced diet that includes: ? 5 or more servings of fruits and vegetables each day. At each meal, try to fill half of your plate with fruits and vegetables. ? Up to 6-8 servings of whole grains each day. ? Less than 6 oz of lean meat, poultry, or fish each day. A 3-oz serving of meat is about the same size as a deck of cards. One egg equals 1 oz. ? 2 servings of low-fat dairy each day. ? A serving of nuts, seeds, or beans 5 times each week. ? Heart-healthy fats. Healthy fats called Omega-3 fatty acids are found in foods such as flaxseeds and coldwater fish, like sardines, salmon, and mackerel.  Limit how much you eat of the following: ? Canned or prepackaged foods. ? Food that is high in trans fat, such as fried foods. ? Food that is high in saturated fat, such as fatty meat. ? Sweets, desserts, sugary drinks, and other foods with added sugar. ? Full-fat  dairy products.  Do not salt foods before eating.  Try to eat at least 2 vegetarian meals each week.  Eat more home-cooked food and less restaurant, buffet, and fast food.  When eating at a restaurant, ask that your food be prepared with less salt or no salt, if possible. What foods are recommended? The items listed may not be a complete list. Talk with your dietitian about what dietary choices are best for you. Grains Whole-grain or whole-wheat bread. Whole-grain or whole-wheat pasta. Brown rice. Oatmeal. Quinoa. Bulgur. Whole-grain and low-sodium cereals. Pita bread. Low-fat, low-sodium crackers. Whole-wheat flour tortillas. Vegetables Fresh or frozen vegetables (raw, steamed, roasted, or grilled). Low-sodium or reduced-sodium tomato and vegetable juice. Low-sodium or reduced-sodium tomato sauce and tomato paste. Low-sodium or reduced-sodium canned vegetables. Fruits All fresh, dried, or frozen fruit. Canned fruit in natural juice (without added sugar). Meat and other protein foods Skinless chicken or turkey. Ground chicken or turkey. Pork with fat trimmed off. Fish and seafood. Egg whites. Dried beans, peas, or lentils. Unsalted nuts, nut butters, and seeds. Unsalted canned beans. Lean cuts of beef with fat trimmed off. Low-sodium, lean deli meat. Dairy Low-fat (1%) or fat-free (skim) milk. Fat-free, low-fat, or reduced-fat cheeses. Nonfat, low-sodium ricotta or cottage cheese. Low-fat or nonfat yogurt. Low-fat, low-sodium cheese. Fats and oils Soft margarine without trans fats. Vegetable oil. Low-fat, reduced-fat, or light mayonnaise and salad dressings (reduced-sodium). Canola, safflower, olive, soybean, and sunflower oils. Avocado. Seasoning and other foods Herbs. Spices. Seasoning mixes without salt. Unsalted popcorn and pretzels. Fat-free sweets. What foods are not recommended? The items listed may not be a complete list. Talk with your dietitian about what dietary choices are best  for you. Grains Baked goods made with fat, such as croissants, muffins, or some breads. Dry pasta or rice meal packs. Vegetables Creamed or fried vegetables. Vegetables in a cheese sauce. Regular canned vegetables (not low-sodium or reduced-sodium). Regular canned tomato sauce and paste (not low-sodium or reduced-sodium). Regular tomato and vegetable juice (not low-sodium or reduced-sodium). Pickles. Olives. Fruits Canned fruit in a light or heavy syrup. Fried fruit. Fruit in cream or butter sauce. Meat and other protein foods Fatty cuts of meat. Ribs. Fried meat. Bacon. Sausage. Bologna and other processed lunch meats. Salami. Fatback. Hotdogs. Bratwurst. Salted nuts and seeds. Canned beans with   added salt. Canned or smoked fish. Whole eggs or egg yolks. Chicken or turkey with skin. Dairy Whole or 2% milk, cream, and half-and-half. Whole or full-fat cream cheese. Whole-fat or sweetened yogurt. Full-fat cheese. Nondairy creamers. Whipped toppings. Processed cheese and cheese spreads. Fats and oils Butter. Stick margarine. Lard. Shortening. Ghee. Bacon fat. Tropical oils, such as coconut, palm kernel, or palm oil. Seasoning and other foods Salted popcorn and pretzels. Onion salt, garlic salt, seasoned salt, table salt, and sea salt. Worcestershire sauce. Tartar sauce. Barbecue sauce. Teriyaki sauce. Soy sauce, including reduced-sodium. Steak sauce. Canned and packaged gravies. Fish sauce. Oyster sauce. Cocktail sauce. Horseradish that you find on the shelf. Ketchup. Mustard. Meat flavorings and tenderizers. Bouillon cubes. Hot sauce and Tabasco sauce. Premade or packaged marinades. Premade or packaged taco seasonings. Relishes. Regular salad dressings. Where to find more information:  National Heart, Lung, and Blood Institute: www.nhlbi.nih.gov  American Heart Association: www.heart.org Summary  The DASH eating plan is a healthy eating plan that has been shown to reduce high blood pressure  (hypertension). It may also reduce your risk for type 2 diabetes, heart disease, and stroke.  With the DASH eating plan, you should limit salt (sodium) intake to 2,300 mg a day. If you have hypertension, you may need to reduce your sodium intake to 1,500 mg a day.  When on the DASH eating plan, aim to eat more fresh fruits and vegetables, whole grains, lean proteins, low-fat dairy, and heart-healthy fats.  Work with your health care provider or diet and nutrition specialist (dietitian) to adjust your eating plan to your individual calorie needs. This information is not intended to replace advice given to you by your health care provider. Make sure you discuss any questions you have with your health care provider. Document Released: 01/10/2011 Document Revised: 01/03/2017 Document Reviewed: 01/15/2016 Elsevier Patient Education  2020 Elsevier Inc.  

## 2018-12-08 NOTE — Progress Notes (Signed)
Established Patient Office Visit  Subjective:  Patient ID: Nathan Wong, male    DOB: 1955-08-22  Age: 63 y.o. MRN: [id number]  CC:  Chief Complaint  Patient presents with  . Diabetes    HPI Nathan Wong presents for follow up of controlled type 2 diabetes, hypertension and medication refill. His HgbA1c done on 10/28/2018 was 6.4%, and he's compliant with his medications. He states that he checks his blood glucose twice daily and his fasting blood glucose readings are in the low to mid 120's and pre dinner readings are less than 160 though he forgot to bring his log. His blood glucose was checked during visit and it was 146 mg/dl. He denies hypoglycemic/hyperglycemic symptoms. He states that he performs daily foot checks, and reports that his neuropathy is manageable with taking gabapentin.  He submitted his [facility] care application and will be waiting for Gastroenterology appointment for Colonoscopy screening and Podiatry for peripheral neuropathy. He doesn't have a blood pressure machine at home, but reports that he's adhering to healthy diet and exercises as tolerated. Otherwise, he states that he's doing well and offers no further complaint.  Past Medical History:  Diagnosis Date  . Arthritis   . Diabetes mellitus without complication ([place])   . H/O hernia repair   . Hypertension     Past Surgical History:  Procedure Laterality Date  . HERNIA REPAIR     abdominal    Family History  Problem Relation Age of Onset  . CAD Mother   . Early death Neg Hx     Social History   Socioeconomic History  . Marital status: Single    Spouse name: Not on file  . Number of children: Not on file  . Years of education: Not on file  . Highest education level: Not on file  Occupational History  . Not on file  Social Needs  . Financial resource strain: Not on file  . Food insecurity    Worry: Not on file    Inability: Not on file  . Transportation needs    Medical: Not on file   Non-medical: Not on file  Tobacco Use  . Smoking status: Never Smoker  . Smokeless tobacco: Never Used  Substance and Sexual Activity  . Alcohol use: No  . Drug use: No  . Sexual activity: Not on file  Lifestyle  . Physical activity    Days per week: Not on file    Minutes per session: Not on file  . Stress: Not on file  Relationships  . Social Herbalist on phone: Not on file    Gets together: Not on file    Attends religious service: Not on file    Active member of club or organization: Not on file    Attends meetings of clubs or organizations: Not on file    Relationship status: Not on file  . Intimate partner violence    Fear of current or ex partner: Not on file    Emotionally abused: Not on file    Physically abused: Not on file    Forced sexual activity: Not on file  Other Topics Concern  . Not on file  Social History Narrative  . Not on file    Outpatient Medications Prior to Visit  Medication Sig Dispense Refill  . blood glucose meter kit and supplies KIT Dispense based on patient and insurance preference. Use up to four times daily as directed. (FOR ICD-9 250.00, 250.01).  1 each 0  . cholecalciferol (VITAMIN D3) 25 MCG (1000 UT) tablet Take 1,000 Units by mouth daily.    Marland Kitchen gabapentin (NEURONTIN) 400 MG capsule Take 1 capsule (400 mg total) by mouth 3 (three) times daily. 90 capsule 0  . levothyroxine (SYNTHROID) 100 MCG tablet Take 0.5 tablets (50 mcg total) by mouth daily before breakfast. 30 tablet 3  . lisinopril (ZESTRIL) 10 MG tablet Take 1 tablet (10 mg total) by mouth daily. 30 tablet 3  . metFORMIN (GLUCOPHAGE) 500 MG tablet Take 1,000 mg by mouth 2 (two) times daily.   2  . acetaminophen (TYLENOL) 500 MG tablet Take 500 mg by mouth every 6 (six) hours as needed for mild pain. Just takes PRN and takes 2 tab qAM.    . aspirin EC 81 MG tablet Take 81 mg by mouth daily.    . Insulin Degludec 200 UNIT/ML SOPN Inject 20 Units into the skin at bedtime.  5 pen 3   No facility-administered medications prior to visit.     No Known Allergies  ROS Review of Systems  Constitutional: Negative.   Eyes: Negative.   Respiratory: Negative.   Endocrine: Negative.   Musculoskeletal: Negative.   Skin: Negative.   Neurological: Negative.   Psychiatric/Behavioral: Negative.       Objective:    Physical Exam  Constitutional: He is oriented to person, place, and time. He appears well-developed and well-nourished.  HENT:  Head: Normocephalic and atraumatic.  Eyes: Pupils are equal, round, and reactive to light. EOM are normal.  Cardiovascular: Normal rate and regular rhythm.  Pulmonary/Chest: Effort normal and breath sounds normal.  Abdominal: Soft. Bowel sounds are normal.  Neurological: He is alert and oriented to person, place, and time.  Skin: Skin is warm and dry.  Psychiatric: He has a normal mood and affect. His behavior is normal. Judgment and thought content normal.    BP 131/69 (BP Location: Left Arm, Patient Position: Sitting, Cuff Size: Large)   Pulse 90   Ht 6' (1.829 m)   Wt 223 lb (101.2 kg)   SpO2 98%   BMI 30.24 kg/m  Wt Readings from Last 3 Encounters:  12/08/18 223 lb (101.2 kg)  11/10/18 220 lb (99.8 kg)  10/22/18 221 lb (100.2 kg)   He gained 3 pounds and was encouraged to continue on his weight loss regimen.  Health Maintenance Due  Topic Date Due  . Hepatitis C Screening  [birth year]  . OPHTHALMOLOGY EXAM  08/03/1964  . HIV Screening  08/03/1969  . TETANUS/TDAP  08/03/1973  . COLONOSCOPY  08/03/2004    There are no preventive care reminders to display for this patient.  Lab Results  Component Value Date   TSH 2.940 10/28/2018   Lab Results  Component Value Date   WBC 8.9 10/28/2018   HGB 16.3 10/28/2018   HCT 47.4 10/28/2018   MCV 89 10/28/2018   PLT 225 10/28/2018   Lab Results  Component Value Date   NA 138 10/28/2018   K 4.8 10/28/2018   CO2 24 10/28/2018   GLUCOSE 223 (H) 10/28/2018    BUN 14 10/28/2018   CREATININE 1.03 10/28/2018   BILITOT 0.7 10/28/2018   ALKPHOS 101 10/28/2018   AST 18 10/28/2018   ALT 12 10/28/2018   PROT 7.8 10/28/2018   ALBUMIN 4.7 10/28/2018   CALCIUM 9.9 10/28/2018   ANIONGAP 10 01/04/2016   Lab Results  Component Value Date   CHOL 188 10/28/2018   Lab Results  Component Value Date   HDL 38 (L) 10/28/2018   Lab Results  Component Value Date   LDLCALC 87 10/28/2018   Lab Results  Component Value Date   TRIG 387 (H) 10/28/2018   Lab Results  Component Value Date   CHOLHDL 4.9 10/28/2018   Lab Results  Component Value Date   HGBA1C 6.4 (H) 10/28/2018      Assessment & Plan:   1. Type 2 diabetes mellitus with diabetic neuropathy, unspecified whether long term insulin use (HCC) - His blood glucose is controlled, HgbA1c was 6.4%, he was encouraged to continue on current treatment regimen. He was advised to continue to -Use Diabetic diet as advised  -Check blood sugar 2-3 times a day, once before breakfast and others 2 hours after lunch or dinner, write down the numbers against date in a log and bring log to clinic every visit -Take medications regularly as advised -Regular exercise as tolerated - gabapentin (NEURONTIN) 400 MG capsule; Take 1 capsule (400 mg total) by mouth 3 (three) times daily.  Dispense: 90 capsule; Refill: 5 - HgB A1c; Future - Lipid panel; Future - Basic Metabolic Panel (BMET); Future - metFORMIN (GLUCOPHAGE) 500 MG tablet; Take 2 tablets (1,000 mg total) by mouth 2 (two) times daily.  Dispense: 60 tablet; Refill: 5 - Insulin Degludec 200 UNIT/ML SOPN; Inject 20 Units into the skin at bedtime.  Dispense: 5 pen; Refill: 4  2. Hypothyroidism, unspecified type - He will continue on current treatment regimen. - levothyroxine (SYNTHROID) 100 MCG tablet; Take 0.5 tablets (50 mcg total) by mouth daily before breakfast.  Dispense: 30 tablet; Refill: 5 - TSH; Future  3. Essential hypertension - His blood  pressure was rechecked during visit and it was 131/69. His goal blood pressure is < 150/90. He was provided with blood pressure machine and was advised to check and record Bp and bring log during office visit. He was encouraged to continue on DASH diet and exercise as tolerated. - Basic Metabolic Panel (BMET); Future - lisinopril (ZESTRIL) 10 MG tablet; Take 1 tablet (10 mg total) by mouth daily.  Dispense: 30 tablet; Refill: 5  4. Health care maintenance - His Charity care application will be submitted for - Ambulatory referral to Gastroenterology for Colonoscopy screening.     Follow-up: Return in about 22 weeks (around 05/11/2019), or if symptoms worsen or fail to improve.    Dearius Hoffmann Jerold Coombe, NP

## 2018-12-10 ENCOUNTER — Telehealth: Payer: Self-pay | Admitting: Pharmacist

## 2018-12-10 NOTE — Telephone Encounter (Signed)
12/10/2018 8:14:17 AM - Tyler Aas U100 Flextouch & tips  12/10/2018 Faxed [employer] application for [employer] U100 Inject 20 units daily #2 boxes & Novofine 32G tips #2 boxes.Delos Haring

## 2018-12-17 ENCOUNTER — Encounter: Payer: Self-pay | Admitting: *Deleted

## 2019-04-05 ENCOUNTER — Ambulatory Visit: Payer: Self-pay | Admitting: Pharmacist

## 2019-04-05 NOTE — Progress Notes (Signed)
  [facility] Visit Note  Patient: Nathan Wong MRN: [id number] Date of Birth: [birth year] PCP: Corky Downs, MD   Patient was not reached via phone. Rescheduled in-person for 04/06/19.   Patient Information   Past Medical History:  Diagnosis Date  . Arthritis   . Diabetes mellitus without complication (HCC)   . H/O hernia repair   . Hypertension       Past Surgical History:  Procedure Laterality Date  . HERNIA REPAIR     abdominal     Family History  Problem Relation Age of Onset  . CAD Mother   . Early death Neg Hx     New Diagnoses (since last visit):   Family Support: Good  Lifestyle Diet: Breakfast: Lunch: Dinner: Drinks:            Social History   Substance and Sexual Activity  Alcohol Use No      Social History   Tobacco Use  Smoking Status Never Smoker  Smokeless Tobacco Never Used      Health Maintenance  Topic Date Due  . Hepatitis C Screening  02/28/1955  . OPHTHALMOLOGY EXAM  08/03/1964  . HIV Screening  08/03/1969  . TETANUS/TDAP  08/03/1973  . COLONOSCOPY  08/03/2004  . HEMOGLOBIN A1C  04/27/2019  . FOOT EXAM  11/10/2019  . INFLUENZA VACCINE  Completed   Health Maintenance/Date Completed  Last ED visit: 06/01/18 R hip pain Last Visit to PCP: 12/08/18 Next Visit to PCP: 05/13/18 Specialist Visit:  Dental Exam:  Eye Exam:  Prostate Exam:  DEXA:  Colonoscopy:  Flu Vaccine:  Pneumonia Vaccine:  COVID-19 Vaccine:  Shingrix Vaccine:

## 2019-04-06 ENCOUNTER — Other Ambulatory Visit: Payer: Self-pay

## 2019-04-06 ENCOUNTER — Ambulatory Visit: Payer: Self-pay | Admitting: Pharmacist

## 2019-04-06 ENCOUNTER — Encounter: Payer: Self-pay | Admitting: Pharmacist

## 2019-04-06 DIAGNOSIS — Z79899 Other long term (current) drug therapy: Secondary | ICD-10-CM

## 2019-04-07 ENCOUNTER — Encounter: Payer: Self-pay | Admitting: Pharmacist

## 2019-04-07 NOTE — Progress Notes (Unsigned)
[facility] Visit Note  Patient: Nathan Wong MRN: [id number] Date of Birth: 04-17-1955 PCP: Cletis Athens, MD   Nathan Wong 64 y.o. male presents to the clinic for a medication therapy management visit. Patient was identified by name and date of birth. Mr. Gallon will be 36 in June and will transition to Medicare.  There were no vitals taken for this visit.  Patient Information   Past Medical History:  Diagnosis Date  . Arthritis   . Diabetes mellitus without complication ([place])   . H/O hernia repair   . Hypertension       Past Surgical History:  Procedure Laterality Date  . HERNIA REPAIR     abdominal     Family History  Problem Relation Age of Onset  . CAD Mother   . Diabetes Sister   . Hypertension Sister   . Early death Neg Hx     New Diagnoses (since last visit): n/a  Family Support: Good  Lifestyle Diet: Breakfast: Eggs Lunch: Hamburgers Dinner: Varies     Current Exercise Habits: Home exercise routine, Type of exercise: walking, Time (Minutes): 10, Frequency (Times/Week): 3, Intensity: Mild  Exercise limited by: None identified    Social History   Substance and Sexual Activity  Alcohol Use No      Social History   Tobacco Use  Smoking Status Never Smoker  Smokeless Tobacco Never Used      Health Maintenance  Topic Date Due  . Hepatitis C Screening  [birth year]  . OPHTHALMOLOGY EXAM  08/03/1964  . HIV Screening  08/03/1969  . TETANUS/TDAP  08/03/1973  . COLONOSCOPY  08/03/2004  . HEMOGLOBIN A1C  04/27/2019  . FOOT EXAM  11/10/2019  . INFLUENZA VACCINE  Completed   Health Maintenance/Date Completed  Last ED visit: 06/01/18 Last Visit to PCP: 12/08/18 Next Visit to PCP: 05/13/19 Specialist Visit:  Dental Exam: none recent Eye Exam: none recent Prostate Exam: Years ago Colonoscopy: none Flu Vaccine: 2 years ago Pneumonia Vaccine: ? COVID-19 Vaccine: no Shingrix Vaccine: no    Outpatient Encounter  Medications as of 04/06/2019  Medication Sig  . acetaminophen (TYLENOL) 500 MG tablet Take 500 mg by mouth every 6 (six) hours as needed for mild pain.   . blood glucose meter kit and supplies KIT Dispense based on patient and insurance preference. Use up to four times daily as directed. (FOR ICD-9 250.00, 250.01).  . Blood Pressure KIT 1 kit by Does not apply route daily.  . cholecalciferol (VITAMIN D3) 25 MCG (1000 UT) tablet Take 1,000 Units by mouth daily.  Marland Kitchen gabapentin (NEURONTIN) 400 MG capsule Take 1 capsule (400 mg total) by mouth 3 (three) times daily.  Marland Kitchen glipiZIDE (GLUCOTROL) 5 MG tablet Take 5 mg by mouth in the morning. With food  . Insulin Degludec 200 UNIT/ML SOPN Inject 20 Units into the skin at bedtime. (Patient taking differently: Inject 20 Units into the skin in the morning. )  . levothyroxine (SYNTHROID) 50 MCG tablet Take 50 mcg by mouth daily before breakfast.  . lisinopril (ZESTRIL) 10 MG tablet Take 1 tablet (10 mg total) by mouth daily.  . metFORMIN (GLUCOPHAGE) 1000 MG tablet Take 1,000 mg by mouth 2 (two) times daily with a meal.   No facility-administered encounter medications on file as of 04/06/2019.    ASSESSMENT:  Adherence:  Takes medications as prescribed. Does not use a pill box, but is able to remember to take his medications.  Diabetes: Tresiba, Metformin, Glipizide, Lisinopril  Appears that glipizide was discontinued last September. However, patient stated that he put the bottle up and forgot that he was to continue taking and restarted the glipizide. Will confirm with PCP. A1c = 6.4% 10/2018 Fasting blood sugar around 130. Afternoon blood sugar around 150 (before meals). Denies any low blood sugars. Not currently on a statin or aspirin. Patient previously taking a low dose aspirin. Triglycerides elevated at 387 mg/dl, HDL low at 38 mg/dl. (10/28/18) Patient endorses walking every other day for 10-15 minutes when the weather is good.  Hypertension:  Lisinopril Monitors at home. Not assessed at today's visit.  Neuropathy: Gabapentin Endorse pain in the right knee and foot. States gabapentin helps with the pain. Feels it is related to "chipping" of the tailbone.  Thyroid: Levothyroxine No issues. TSH 2.94 (10/2018)   PLAN: Labs scheduled for 04/28/19; PCP visit scheduled for 05/13/19. Encouraged patient to discuss aspirin use with PCP. Pharmacist to confirm with PCP about glipizide use. Patient will inquire about referral to podiatrist for foot at PCP visit. Patient will have Medicare June 1st, 2021 and will be able to access his medications through his Part D plan. Encouraged patient to utilize Medicare benefits for an eye exam, dental exam, and colonoscopy. Patient encouraged to follow up with [facility] as needed.   Shailyn Weyandt K. Dicky Doe, PharmD [facility] [place] Operations Coordinator [phone]

## 2019-04-15 ENCOUNTER — Telehealth: Payer: Self-pay | Admitting: Pharmacy Technician

## 2019-04-15 NOTE — Telephone Encounter (Signed)
Received updated proof of income.  Patient eligible to receive medication assistance at [facility] until 07/06/19.  Will have Medicare beginning 07/06/19.  Sherilyn Dacosta Care Manager [facility]

## 2019-04-28 ENCOUNTER — Other Ambulatory Visit: Payer: Self-pay

## 2019-04-28 DIAGNOSIS — I10 Essential (primary) hypertension: Secondary | ICD-10-CM

## 2019-04-28 DIAGNOSIS — E1140 Type 2 diabetes mellitus with diabetic neuropathy, unspecified: Secondary | ICD-10-CM

## 2019-04-28 DIAGNOSIS — E039 Hypothyroidism, unspecified: Secondary | ICD-10-CM

## 2019-04-29 LAB — LIPID PANEL
Chol/HDL Ratio: 4.1 ratio (ref 0.0–5.0)
Cholesterol, Total: 157 mg/dL (ref 100–199)
HDL: 38 mg/dL — ABNORMAL LOW (ref >39)
LDL Chol Calc (NIH): 91 mg/dL (ref 0–99)
Triglycerides: 163 mg/dL — ABNORMAL HIGH (ref 0–149)
VLDL Cholesterol Cal: 28 mg/dL (ref 5–40)

## 2019-04-29 LAB — BASIC METABOLIC PANEL
BUN/Creatinine Ratio: 16 (ref 10–24)
BUN: 18 mg/dL (ref 8–27)
CO2: 24 mmol/L (ref 20–29)
Calcium: 9.7 mg/dL (ref 8.6–10.2)
Chloride: 97 mmol/L (ref 96–106)
Creatinine, Ser: 1.15 mg/dL (ref 0.76–1.27)
GFR calc Af Amer: 77 mL/min/{1.73_m2} (ref >59)
GFR calc non Af Amer: 67 mL/min/{1.73_m2} (ref >59)
Glucose: 156 mg/dL — ABNORMAL HIGH (ref 65–99)
Potassium: 4.8 mmol/L (ref 3.5–5.2)
Sodium: 136 mmol/L (ref 134–144)

## 2019-04-29 LAB — TSH: TSH: 0.772 u[IU]/mL (ref 0.450–4.500)

## 2019-04-29 LAB — HEMOGLOBIN A1C
Est. average glucose Bld gHb Est-mCnc: 163 mg/dL
Hgb A1c MFr Bld: 7.3 % — ABNORMAL HIGH (ref 4.8–5.6)

## 2019-05-11 ENCOUNTER — Ambulatory Visit: Payer: Self-pay | Admitting: Gerontology

## 2019-05-13 ENCOUNTER — Encounter: Payer: Self-pay | Admitting: Gerontology

## 2019-05-13 ENCOUNTER — Ambulatory Visit: Payer: Self-pay | Admitting: Gerontology

## 2019-05-13 VITALS — BP 124/80 | HR 95 | Ht 72.0 in | Wt 212.0 lb

## 2019-05-13 DIAGNOSIS — I10 Essential (primary) hypertension: Secondary | ICD-10-CM

## 2019-05-13 DIAGNOSIS — K219 Gastro-esophageal reflux disease without esophagitis: Secondary | ICD-10-CM

## 2019-05-13 DIAGNOSIS — E1140 Type 2 diabetes mellitus with diabetic neuropathy, unspecified: Secondary | ICD-10-CM

## 2019-05-13 DIAGNOSIS — E039 Hypothyroidism, unspecified: Secondary | ICD-10-CM

## 2019-05-13 MED ORDER — OMEPRAZOLE 20 MG PO CPDR: 20.0000 mg | DELAYED_RELEASE_CAPSULE | Freq: Every day | ORAL | 0 refills | Status: DC

## 2019-05-13 MED ORDER — LEVOTHYROXINE SODIUM 50 MCG PO TABS: 50.0000 ug | ORAL_TABLET | Freq: Every day | ORAL | 3 refills | Status: DC

## 2019-05-13 MED ORDER — METFORMIN HCL 1000 MG PO TABS: 1000.0000 mg | ORAL_TABLET | Freq: Two times a day (BID) | ORAL | 3 refills | Status: AC

## 2019-05-13 MED ORDER — GLIPIZIDE 5 MG PO TABS: 5.0000 mg | ORAL_TABLET | Freq: Every morning | ORAL | 3 refills | Status: DC

## 2019-05-13 NOTE — Patient Instructions (Addendum)
Food Choices for Gastroesophageal Reflux Disease, Adult When you have gastroesophageal reflux disease (GERD), the foods you eat and your eating habits are very important. Choosing the right foods can help ease your discomfort. Think about working with a nutrition specialist (dietitian) to help you make good choices. What are tips for following this plan?  Meals  Choose healthy foods that are low in fat, such as fruits, vegetables, whole grains, low-fat dairy products, and lean meat, fish, and poultry.  Eat small meals often instead of 3 large meals a day. Eat your meals slowly, and in a place where you are relaxed. Avoid bending over or lying down until 2-3 hours after eating.  Avoid eating meals 2-3 hours before bed.  Avoid drinking a lot of liquid with meals.  Cook foods using methods other than frying. Bake, grill, or broil food instead.  Avoid or limit: ? Chocolate. ? Peppermint or spearmint. ? Alcohol. ? Pepper. ? Black and decaffeinated coffee. ? Black and decaffeinated tea. ? Bubbly (carbonated) soft drinks. ? Caffeinated energy drinks and soft drinks.  Limit high-fat foods such as: ? Fatty meat or fried foods. ? Whole milk, cream, butter, or ice cream. ? Nuts and nut butters. ? Pastries, donuts, and sweets made with butter or shortening.  Avoid foods that cause symptoms. These foods may be different for everyone. Common foods that cause symptoms include: ? Tomatoes. ? Oranges, lemons, and limes. ? Peppers. ? Spicy food. ? Onions and garlic. ? Vinegar. Lifestyle  Maintain a healthy weight. Ask your doctor what weight is healthy for you. If you need to lose weight, work with your doctor to do so safely.  Exercise for at least 30 minutes for 5 or more days each week, or as told by your doctor.  Wear loose-fitting clothes.  Do not smoke. If you need help quitting, ask your doctor.  Sleep with the head of your bed higher than your feet. Use a wedge under the  mattress or blocks under the bed frame to raise the head of the bed. Summary  When you have gastroesophageal reflux disease (GERD), food and lifestyle choices are very important in easing your symptoms.  Eat small meals often instead of 3 large meals a day. Eat your meals slowly, and in a place where you are relaxed.  Limit high-fat foods such as fatty meat or fried foods.  Avoid bending over or lying down until 2-3 hours after eating.  Avoid peppermint and spearmint, caffeine, alcohol, and chocolate. This information is not intended to replace advice given to you by your health care provider. Make sure you discuss any questions you have with your health care provider. Document Revised: 05/14/2018 Document Reviewed: 02/27/2016 Elsevier Patient Education  2020 [place] Your Hypertension Hypertension is commonly called high blood pressure. This is when the force of your blood pressing against the walls of your arteries is too strong. Arteries are blood vessels that carry blood from your heart throughout your body. Hypertension forces the heart to work harder to pump blood, and may cause the arteries to become narrow or stiff. Having untreated or uncontrolled hypertension can cause heart attack, stroke, kidney disease, and other problems. What are blood pressure readings? A blood pressure reading consists of a higher number over a lower number. Ideally, your blood pressure should be below 120/80. The first ("top") number is called the systolic pressure. It is a measure of the pressure in your arteries as your heart beats. The second ("bottom") number  is called the diastolic pressure. It is a measure of the pressure in your arteries as the heart relaxes. What does my blood pressure reading mean? Blood pressure is classified into four stages. Based on your blood pressure reading, your health care provider may use the following stages to determine what type of treatment you need, if  any. Systolic pressure and diastolic pressure are measured in a unit called mm Hg. Normal  Systolic pressure: below 093.  Diastolic pressure: below 80. Elevated  Systolic pressure: 818-299.  Diastolic pressure: below 80. Hypertension stage 1  Systolic pressure: 371-696.  Diastolic pressure: 78-93. Hypertension stage 2  Systolic pressure: 810 or above.  Diastolic pressure: 90 or above. What health risks are associated with hypertension? Managing your hypertension is an important responsibility. Uncontrolled hypertension can lead to:  A heart attack.  A stroke.  A weakened blood vessel (aneurysm).  Heart failure.  Kidney damage.  Eye damage.  Metabolic syndrome.  Memory and concentration problems. What changes can I make to manage my hypertension? Hypertension can be managed by making lifestyle changes and possibly by taking medicines. Your health care provider will help you make a plan to bring your blood pressure within a normal range. Eating and drinking   Eat a diet that is high in fiber and potassium, and low in salt (sodium), added sugar, and fat. An example eating plan is called the DASH (Dietary Approaches to Stop Hypertension) diet. To eat this way: ? Eat plenty of fresh fruits and vegetables. Try to fill half of your plate at each meal with fruits and vegetables. ? Eat whole grains, such as whole wheat pasta, brown rice, or whole grain bread. Fill about one quarter of your plate with whole grains. ? Eat low-fat diary products. ? Avoid fatty cuts of meat, processed or cured meats, and poultry with skin. Fill about one quarter of your plate with lean proteins such as fish, chicken without skin, beans, eggs, and tofu. ? Avoid premade and processed foods. These tend to be higher in sodium, added sugar, and fat.  Reduce your daily sodium intake. Most people with hypertension should eat less than 1,500 mg of sodium a day.  Limit alcohol intake to no more than 1  drink a day for nonpregnant women and 2 drinks a day for men. One drink equals 12 oz of beer, 5 oz of wine, or 1 oz of hard liquor. Lifestyle  Work with your health care provider to maintain a healthy body weight, or to lose weight. Ask what an ideal weight is for you.  Get at least 30 minutes of exercise that causes your heart to beat faster (aerobic exercise) most days of the week. Activities may include walking, swimming, or biking.  Include exercise to strengthen your muscles (resistance exercise), such as weight lifting, as part of your weekly exercise routine. Try to do these types of exercises for 30 minutes at least 3 days a week.  Do not use any products that contain nicotine or tobacco, such as cigarettes and e-cigarettes. If you need help quitting, ask your health care provider.  Control any long-term (chronic) conditions you have, such as high cholesterol or diabetes. Monitoring  Monitor your blood pressure at home as told by your health care provider. Your personal target blood pressure may vary depending on your medical conditions, your age, and other factors.  Have your blood pressure checked regularly, as often as told by your health care provider. Working with your health care provider  Review all the medicines you take with your health care provider because there may be side effects or interactions.  Talk with your health care provider about your diet, exercise habits, and other lifestyle factors that may be contributing to hypertension.  Visit your health care provider regularly. Your health care provider can help you create and adjust your plan for managing hypertension. Will I need medicine to control my blood pressure? Your health care provider may prescribe medicine if lifestyle changes are not enough to get your blood pressure under control, and if:  Your systolic blood pressure is 130 or higher.  Your diastolic blood pressure is 80 or higher. Take medicines  only as told by your health care provider. Follow the directions carefully. Blood pressure medicines must be taken as prescribed. The medicine does not work as well when you skip doses. Skipping doses also puts you at risk for problems. Contact a health care provider if:  You think you are having a reaction to medicines you have taken.  You have repeated (recurrent) headaches.  You feel dizzy.  You have swelling in your ankles.  You have trouble with your vision. Get help right away if:  You develop a severe headache or confusion.  You have unusual weakness or numbness, or you feel faint.  You have severe pain in your chest or abdomen.  You vomit repeatedly.  You have trouble breathing. Summary  Hypertension is when the force of blood pumping through your arteries is too strong. If this condition is not controlled, it may put you at risk for serious complications.  Your personal target blood pressure may vary depending on your medical conditions, your age, and other factors. For most people, a normal blood pressure is less than 120/80.  Hypertension is managed by lifestyle changes, medicines, or both. Lifestyle changes include weight loss, eating a healthy, low-sodium diet, exercising more, and limiting alcohol. This information is not intended to replace advice given to you by your health care provider. Make sure you discuss any questions you have with your health care provider. Document Revised: 05/15/2018 Document Reviewed: 12/20/2015 Elsevier Patient Education  2020 Else[employer] Inc[employer]  Carbohydrate Counting for Diabetes Mellitus, Adult  Carbohydrate counting is a method of keeping track of how many carbohydrates you eat. Eating carbohydrates naturally increases the amount of sugar (glucose) in the blood. Counting how many carbohydrates you eat helps keep your blood glucose within normal limits, which helps you manage your diabetes (diabetes mellitus). It is important to know how  many carbohydrates you can safely have in each meal. This is different for every person. A diet and nutrition specialist (registered dietitian) can help you make a meal plan and calculate how many carbohydrates you should have at each meal and snack. Carbohydrates are found in the following foods:  Grains, such as breads and cereals.  Dried beans and soy products.  Starchy vegetables, such as potatoes, peas, and corn.  Fruit and fruit juices.  Milk and yogurt.  Sweets and snack foods, such as cake, cookies, candy, chips, and soft drinks. How do I count carbohydrates? There are two ways to count carbohydrates in food. You can use either of the methods or a combination of both. Reading "Nutrition Facts" on packaged food The "Nutrition Facts" list is included on the labels of almost all packaged foods and beverages in the [place] It includes:  The serving size.  Information about nutrients in each serving, including the grams (g) of carbohydrate per serving. To use the "  Nutrition Facts":  Decide how many servings you will have.  Multiply the number of servings by the number of carbohydrates per serving.  The resulting number is the total amount of carbohydrates that you will be having. Learning standard serving sizes of other foods When you eat carbohydrate foods that are not packaged or do not include "Nutrition Facts" on the label, you need to measure the servings in order to count the amount of carbohydrates:  Measure the foods that you will eat with a food scale or measuring cup, if needed.  Decide how many standard-size servings you will eat.  Multiply the number of servings by 15. Most carbohydrate-rich foods have about 15 g of carbohydrates per serving. ? For example, if you eat 8 oz (170 g) of strawberries, you will have eaten 2 servings and 30 g of carbohydrates (2 servings x 15 g = 30 g).  For foods that have more than one food mixed, such as soups and casseroles, you must  count the carbohydrates in each food that is included. The following list contains standard serving sizes of common carbohydrate-rich foods. Each of these servings has about 15 g of carbohydrates:   hamburger bun or  English muffin.   oz (15 mL) syrup.   oz (14 g) jelly.  1 slice of bread.  1 six-inch tortilla.  3 oz (85 g) cooked rice or pasta.  4 oz (113 g) cooked dried beans.  4 oz (113 g) starchy vegetable, such as peas, corn, or potatoes.  4 oz (113 g) hot cereal.  4 oz (113 g) mashed potatoes or  of a large baked potato.  4 oz (113 g) canned or frozen fruit.  4 oz (120 mL) fruit juice.  4-6 crackers.  6 chicken nuggets.  6 oz (170 g) unsweetened dry cereal.  6 oz (170 g) plain fat-free yogurt or yogurt sweetened with artificial sweeteners.  8 oz (240 mL) milk.  8 oz (170 g) fresh fruit or one small piece of fruit.  24 oz (680 g) popped popcorn. Example of carbohydrate counting Sample meal  3 oz (85 g) chicken breast.  6 oz (170 g) brown rice.  4 oz (113 g) corn.  8 oz (240 mL) milk.  8 oz (170 g) strawberries with sugar-free whipped topping. Carbohydrate calculation 1. Identify the foods that contain carbohydrates: ? Rice. ? Corn. ? Milk. ? Strawberries. 2. Calculate how many servings you have of each food: ? 2 servings rice. ? 1 serving corn. ? 1 serving milk. ? 1 serving strawberries. 3. Multiply each number of servings by 15 g: ? 2 servings rice x 15 g = 30 g. ? 1 serving corn x 15 g = 15 g. ? 1 serving milk x 15 g = 15 g. ? 1 serving strawberries x 15 g = 15 g. 4. Add together all of the amounts to find the total grams of carbohydrates eaten: ? 30 g + 15 g + 15 g + 15 g = 75 g of carbohydrates total. Summary  Carbohydrate counting is a method of keeping track of how many carbohydrates you eat.  Eating carbohydrates naturally increases the amount of sugar (glucose) in the blood.  Counting how many carbohydrates you eat helps  keep your blood glucose within normal limits, which helps you manage your diabetes.  A diet and nutrition specialist (registered dietitian) can help you make a meal plan and calculate how many carbohydrates you should have at each meal and snack. This information  is not intended to replace advice given to you by your health care provider. Make sure you discuss any questions you have with your health care provider. Document Revised: 08/15/2016 Document Reviewed: 07/05/2015 Elsevier Patient Education  [place].

## 2019-05-13 NOTE — Progress Notes (Signed)
Established Patient Office Visit  Subjective:  Patient ID: Nathan Wong, male    DOB: 11-Oct-1955  Age: 64 y.o. MRN: [id number]  CC: No chief complaint on file.   HPI Nathan Wong presents for follow up of T2DM, Hypertension and lab review. He states that he's compliant with his medications and continues to make healthy lifestyle modifications. His HgbA1c done on 04/28/2019 increased from 6.3% to 7.3%.He checks his blood glucose daily and his fasting reading today was 160 mg/dl, but he states that it ranges between 130 -140 mg/dl. He denies hypoglycemic/hyperglycemic symptoms. He states that his peripheral neuropathy is under control with taking 400 mg gabapentin tid, and he performs daily foot checks.He checks his blood pressure daily and it's usuually less than 130/90.  He states that he's experiencing intermittent acid reflux after he eats and has tried otc tums and rolaid with minimal relief. He states that it started 1-2 months. He states that he will wait until his Medicare kicks in by June  2021 so he can go for Colonoscopy and Ophthalmology exam. Overall, he states that he's doing well and offers no further complaint.  Past Medical History:  Diagnosis Date  . Arthritis   . Diabetes mellitus without complication ([place])   . H/O hernia repair   . Hypertension     Past Surgical History:  Procedure Laterality Date  . HERNIA REPAIR     abdominal    Family History  Problem Relation Age of Onset  . CAD Mother   . Diabetes Sister   . Hypertension Sister   . Early death Neg Hx     Social History   Socioeconomic History  . Marital status: Single    Spouse name: Not on file  . Number of children: Not on file  . Years of education: Not on file  . Highest education level: Not on file  Occupational History  . Not on file  Tobacco Use  . Smoking status: Never Smoker  . Smokeless tobacco: Never Used  Substance and Sexual Activity  . Alcohol use: No  . Drug use: No  . Sexual  activity: Not on file  Other Topics Concern  . Not on file  Social History Narrative  . Not on file   Social Determinants of Health   Financial Resource Strain:   . Difficulty of Paying Living Expenses:   Food Insecurity:   . Worried About Charity fundraiser in the Last Year:   . Arboriculturist in the Last Year:   Transportation Needs:   . Film/video editor (Medical):   Marland Kitchen Lack of Transportation (Non-Medical):   Physical Activity:   . Days of Exercise per Week:   . Minutes of Exercise per Session:   Stress:   . Feeling of Stress :   Social Connections:   . Frequency of Communication with Friends and Family:   . Frequency of Social Gatherings with Friends and Family:   . Attends Religious Services:   . Active Member of Clubs or Organizations:   . Attends Archivist Meetings:   Marland Kitchen Marital Status:   Intimate Partner Violence:   . Fear of Current or Ex-Partner:   . Emotionally Abused:   Marland Kitchen Physically Abused:   . Sexually Abused:     Outpatient Medications Prior to Visit  Medication Sig Dispense Refill  . aspirin EC 81 MG tablet Take 81 mg by mouth daily.    . cholecalciferol (VITAMIN D3) 25 MCG (  1000 UT) tablet Take 1,000 Units by mouth daily.    Marland Kitchen gabapentin (NEURONTIN) 400 MG capsule Take 1 capsule (400 mg total) by mouth 3 (three) times daily. 90 capsule 5  . Insulin Degludec 200 UNIT/ML SOPN Inject 20 Units into the skin at bedtime. (Patient taking differently: Inject 20 Units into the skin in the morning. ) 5 pen 4  . lisinopril (ZESTRIL) 10 MG tablet Take 1 tablet (10 mg total) by mouth daily. 30 tablet 5  . glipiZIDE (GLUCOTROL) 5 MG tablet Take 5 mg by mouth in the morning. With food    . levothyroxine (SYNTHROID) 50 MCG tablet Take 50 mcg by mouth daily before breakfast.    . metFORMIN (GLUCOPHAGE) 1000 MG tablet Take 1,000 mg by mouth 2 (two) times daily with a meal.    . acetaminophen (TYLENOL) 500 MG tablet Take 500 mg by mouth every 6 (six) hours as  needed for mild pain.     . blood glucose meter kit and supplies KIT Dispense based on patient and insurance preference. Use up to four times daily as directed. (FOR ICD-9 250.00, 250.01). 1 each 0  . Blood Pressure KIT 1 kit by Does not apply route daily. 1 kit 0   No facility-administered medications prior to visit.    No Known Allergies  ROS Review of Systems  Constitutional: Negative.   Eyes: Negative.   Respiratory: Negative.   Cardiovascular: Negative.   Gastrointestinal: Negative.   Endocrine: Negative.   Neurological: Negative.   Psychiatric/Behavioral: Negative.       Objective:    Physical Exam  Constitutional: He is oriented to person, place, and time. He appears well-developed.  HENT:  Head: Normocephalic and atraumatic.  Eyes: Pupils are equal, round, and reactive to light. EOM are normal.  Cardiovascular: Normal rate and regular rhythm.  Pulmonary/Chest: Effort normal and breath sounds normal.  Abdominal: Soft. Bowel sounds are normal.  Neurological: He is alert and oriented to person, place, and time.  Psychiatric: He has a normal mood and affect. His behavior is normal. Judgment and thought content normal.    BP 124/80 (BP Location: Left Arm, Patient Position: Sitting)   Pulse 95   Ht 6' (1.829 m)   Wt 212 lb (96.2 kg)   SpO2 97%   BMI 28.75 kg/m  Wt Readings from Last 3 Encounters:  05/13/19 212 lb (96.2 kg)  04/28/19 217 lb (98.4 kg)  12/08/18 223 lb (101.2 kg)   He lost 5 pounds in 2 weeks, he was advised to continue on his weight loss regimen.  Health Maintenance Due  Topic Date Due  . Hepatitis C Screening  Never done  . OPHTHALMOLOGY EXAM  Never done  . HIV Screening  Never done  . TETANUS/TDAP  Never done  . COLONOSCOPY  Never done      Lab Results  Component Value Date   TSH 0.772 04/28/2019   Lab Results  Component Value Date   WBC 8.9 10/28/2018   HGB 16.3 10/28/2018   HCT 47.4 10/28/2018   MCV 89 10/28/2018   PLT 225  10/28/2018   Lab Results  Component Value Date   NA 136 04/28/2019   K 4.8 04/28/2019   CO2 24 04/28/2019   GLUCOSE 156 (H) 04/28/2019   BUN 18 04/28/2019   CREATININE 1.15 04/28/2019   BILITOT 0.7 10/28/2018   ALKPHOS 101 10/28/2018   AST 18 10/28/2018   ALT 12 10/28/2018   PROT 7.8 10/28/2018   ALBUMIN  4.7 10/28/2018   CALCIUM 9.7 04/28/2019   ANIONGAP 10 01/04/2016   Lab Results  Component Value Date   CHOL 157 04/28/2019   Lab Results  Component Value Date   HDL 38 (L) 04/28/2019   Lab Results  Component Value Date   LDLCALC 91 04/28/2019   Lab Results  Component Value Date   TRIG 163 (H) 04/28/2019   Lab Results  Component Value Date   CHOLHDL 4.1 04/28/2019   Lab Results  Component Value Date   HGBA1C 7.3 (H) 04/28/2019      Assessment & Plan:    1. Essential hypertension - His blood pressure is under control and he was advised to continue on current treatment regimen. He was advised to continue on DASH diet and exercise as tolerated.  2. Type 2 diabetes mellitus with diabetic neuropathy, unspecified whether long term insulin use ([place]) - His HgbA1c was 7.3% and his goal is less than 7%. He will continue on current medication, advised to check his blood glucose daily and his fasting goal should be between 80-130 mg/dl. Also to continue on low carb and non concentrated sweet diet. - glipiZIDE (GLUCOTROL) 5 MG tablet; Take 1 tablet (5 mg total) by mouth in the morning. With food  Dispense: 30 tablet; Refill: 3 - metFORMIN (GLUCOPHAGE) 1000 MG tablet; Take 1 tablet (1,000 mg total) by mouth 2 (two) times daily with a meal.  Dispense: 60 tablet; Refill: 3  3. Hypothyroidism, unspecified type - His TSH is E[place]roid and he will continue on current dose. - levothyroxine (SYNTHROID) 50 MCG tablet; Take 1 tablet (50 mcg total) by mouth daily before breakfast.  Dispense: 30 tablet; Refill: 3  4. Gastroesophageal reflux disease, unspecified whether esophagitis  present - He will start Omeprazole daily, was educated on medication side effects and was advised to notify clinic. -Avoid spicy, fatty and fried food -Avoid sodas and sour juices -Avoid heavy meals -Avoid eating 4 hours before bedtime -Elevate head of bed at night - omeprazole (PRILOSEC) 20 MG capsule; Take 1 capsule (20 mg total) by mouth daily.  Dispense: 30 capsule; Refill: 0    Follow-up: Return in about 26 days (around 06/08/2019), or if symptoms worsen or fail to improve.    Marykathryn Carboni Jerold Coombe, NP

## 2019-06-08 ENCOUNTER — Ambulatory Visit: Payer: Self-pay | Admitting: Gerontology

## 2019-06-08 ENCOUNTER — Other Ambulatory Visit: Payer: Self-pay

## 2019-06-08 DIAGNOSIS — K219 Gastro-esophageal reflux disease without esophagitis: Secondary | ICD-10-CM

## 2019-06-08 DIAGNOSIS — I10 Essential (primary) hypertension: Secondary | ICD-10-CM

## 2019-06-08 MED ORDER — OMEPRAZOLE 20 MG PO CPDR: 20.0000 mg | DELAYED_RELEASE_CAPSULE | Freq: Every day | ORAL | 3 refills | Status: DC

## 2019-06-08 MED ORDER — LISINOPRIL 10 MG PO TABS: 10.0000 mg | ORAL_TABLET | Freq: Every day | ORAL | 5 refills | Status: DC

## 2019-06-08 NOTE — Progress Notes (Signed)
Established Patient Office Visit  Subjective:  Patient ID: Nathan Wong, male    DOB: 1955/04/03  Age: 64 y.o. MRN: [id number]  CC:  Chief Complaint  Patient presents with  . Diabetes  . Hypertension   Patient consents to telephone visit and 2 patient identifiers was used to identify patient.  HPI Nathan Wong presents for follow up of Nathan Wong and medication refill. He states that he's compliant with his medications, and continues to make healthy dietary choices. He was started on 20 mg Omeprazole after his last visit and he states that his symptoms are under control with taking medication. He reports that he's searching for a Medicare Provider since he will be active on June 1st. Overall, he states that he's doing well and offers no further complaint.  Past Medical History:  Diagnosis Date  . Arthritis   . Diabetes mellitus without complication ([place])   . H/O hernia repair   . Hypertension     Past Surgical History:  Procedure Laterality Date  . HERNIA REPAIR     abdominal    Family History  Problem Relation Age of Onset  . CAD Mother   . Diabetes Sister   . Hypertension Sister   . Early death Neg Hx     Social History   Socioeconomic History  . Marital status: Single    Spouse name: Not on file  . Number of children: Not on file  . Years of education: Not on file  . Highest education level: Not on file  Occupational History  . Not on file  Tobacco Use  . Smoking status: Never Smoker  . Smokeless tobacco: Never Used  Substance and Sexual Activity  . Alcohol use: No  . Drug use: No  . Sexual activity: Not on file  Other Topics Concern  . Not on file  Social History Narrative  . Not on file   Social Determinants of Health   Financial Resource Strain:   . Difficulty of Paying Living Expenses:   Food Insecurity:   . Worried About Charity fundraiser in the Last Year:   . Arboriculturist in the Last Year:   Transportation Needs:   . Lexicographer (Medical):   Marland Kitchen Lack of Transportation (Non-Medical):   Physical Activity:   . Days of Exercise per Week:   . Minutes of Exercise per Session:   Stress:   . Feeling of Stress :   Social Connections:   . Frequency of Communication with Friends and Family:   . Frequency of Social Gatherings with Friends and Family:   . Attends Religious Services:   . Active Member of Clubs or Organizations:   . Attends Archivist Meetings:   Marland Kitchen Marital Status:   Intimate Partner Violence:   . Fear of Current or Ex-Partner:   . Emotionally Abused:   Marland Kitchen Physically Abused:   . Sexually Abused:     Outpatient Medications Prior to Visit  Medication Sig Dispense Refill  . aspirin EC 81 MG tablet Take 81 mg by mouth daily.    . cholecalciferol (VITAMIN D3) 25 MCG (1000 UT) tablet Take 1,000 Units by mouth daily.    Marland Kitchen gabapentin (NEURONTIN) 400 MG capsule Take 1 capsule (400 mg total) by mouth 3 (three) times daily. 90 capsule 5  . glipiZIDE (GLUCOTROL) 5 MG tablet Take 1 tablet (5 mg total) by mouth in the morning. With food 30 tablet 3  . Insulin Degludec  200 UNIT/ML SOPN Inject 20 Units into the skin at bedtime. (Patient taking differently: Inject 20 Units into the skin in the morning. ) 5 pen 4  . levothyroxine (SYNTHROID) 50 MCG tablet Take 1 tablet (50 mcg total) by mouth daily before breakfast. 30 tablet 3  . metFORMIN (GLUCOPHAGE) 1000 MG tablet Take 1 tablet (1,000 mg total) by mouth 2 (two) times daily with a meal. 60 tablet 3  . lisinopril (ZESTRIL) 10 MG tablet Take 1 tablet (10 mg total) by mouth daily. 30 tablet 5  . omeprazole (PRILOSEC) 20 MG capsule Take 1 capsule (20 mg total) by mouth daily. 30 capsule 0  . acetaminophen (TYLENOL) 500 MG tablet Take 500 mg by mouth every 6 (six) hours as needed for mild pain.     . blood glucose meter kit and supplies KIT Dispense based on patient and insurance preference. Use up to four times daily as directed. (FOR ICD-9 250.00,  250.01). 1 each 0  . Blood Pressure KIT 1 kit by Does not apply route daily. 1 kit 0   No facility-administered medications prior to visit.    No Known Allergies  ROS Review of Systems  Constitutional: Negative.   Eyes: Negative.   Respiratory: Negative.   Cardiovascular: Negative.   Gastrointestinal: Negative.   Neurological: Negative.   Psychiatric/Behavioral: Negative.       Objective:    Physical Exam  No physical exam was done There were no vitals taken for this visit. Wt Readings from Last 3 Encounters:  05/13/19 212 lb (96.2 kg)  04/28/19 217 lb (98.4 kg)  12/08/18 223 lb (101.2 kg)     Health Maintenance Due  Topic Date Due  . Hepatitis C Screening  Never done  . OPHTHALMOLOGY EXAM  Never done  . HIV Screening  Never done  . COVID-19 Vaccine (1) Never done  . TETANUS/TDAP  Never done  . COLONOSCOPY  Never done    There are no preventive care reminders to display for this patient.  Lab Results  Component Value Date   TSH 0.772 04/28/2019   Lab Results  Component Value Date   WBC 8.9 10/28/2018   HGB 16.3 10/28/2018   HCT 47.4 10/28/2018   MCV 89 10/28/2018   PLT 225 10/28/2018   Lab Results  Component Value Date   NA 136 04/28/2019   K 4.8 04/28/2019   CO2 24 04/28/2019   GLUCOSE 156 (H) 04/28/2019   BUN 18 04/28/2019   CREATININE 1.15 04/28/2019   BILITOT 0.7 10/28/2018   ALKPHOS 101 10/28/2018   AST 18 10/28/2018   ALT 12 10/28/2018   PROT 7.8 10/28/2018   ALBUMIN 4.7 10/28/2018   CALCIUM 9.7 04/28/2019   ANIONGAP 10 01/04/2016   Lab Results  Component Value Date   CHOL 157 04/28/2019   Lab Results  Component Value Date   HDL 38 (L) 04/28/2019   Lab Results  Component Value Date   LDLCALC 91 04/28/2019   Lab Results  Component Value Date   TRIG 163 (H) 04/28/2019   Lab Results  Component Value Date   CHOLHDL 4.1 04/28/2019   Lab Results  Component Value Date   HGBA1C 7.3 (H) 04/28/2019      Assessment & Plan:    1. Essential hypertension - He will continue on current medication and advised to continue on DASH diet and exercise as tolerated. - lisinopril (ZESTRIL) 10 MG tablet; Take 1 tablet (10 mg total) by mouth daily.  Dispense: 30  tablet; Refill: 5  2. Gastroesophageal reflux disease, unspecified whether esophagitis present - His acid reflux is under control and he will continue on current treatment regimen. - He was advised to Avoid spicy, fatty and fried food -Avoid sodas and sour juices -Avoid heavy meal -Avoid eating 4 hours before bedtime -Elevate head of bed at night - omeprazole (PRILOSEC) 20 MG capsule; Take 1 capsule (20 mg total) by mouth daily.  Dispense: 30 capsule; Refill: 3     Follow-up:  Today was his last appointment, though was advised to notify clinic with any concerns prior to 07/06/2019. We wish him well.   Leisel Pinette Jerold Coombe, NP

## 2019-06-08 NOTE — Patient Instructions (Signed)
Carbohydrate Counting for Diabetes Mellitus, Adult  Carbohydrate counting is a method of keeping track of how many carbohydrates you eat. Eating carbohydrates naturally increases the amount of sugar (glucose) in the blood. Counting how many carbohydrates you eat helps keep your blood glucose within normal limits, which helps you manage your diabetes (diabetes mellitus). It is important to know how many carbohydrates you can safely have in each meal. This is different for every person. A diet and nutrition specialist (registered dietitian) can help you make a meal plan and calculate how many carbohydrates you should have at each meal and snack. Carbohydrates are found in the following foods:  Grains, such as breads and cereals.  Dried beans and soy products.  Starchy vegetables, such as potatoes, peas, and corn.  Fruit and fruit juices.  Milk and yogurt.  Sweets and snack foods, such as cake, cookies, candy, chips, and soft drinks. How do I count carbohydrates? There are two ways to count carbohydrates in food. You can use either of the methods or a combination of both. Reading "Nutrition Facts" on packaged food The "Nutrition Facts" list is included on the labels of almost all packaged foods and beverages in the [place] It includes:  The serving size.  Information about nutrients in each serving, including the grams (g) of carbohydrate per serving. To use the "Nutrition Facts":  Decide how many servings you will have.  Multiply the number of servings by the number of carbohydrates per serving.  The resulting number is the total amount of carbohydrates that you will be having. Learning standard serving sizes of other foods When you eat carbohydrate foods that are not packaged or do not include "Nutrition Facts" on the label, you need to measure the servings in order to count the amount of carbohydrates:  Measure the foods that you will eat with a food scale or measuring cup, if needed.   Decide how many standard-size servings you will eat.  Multiply the number of servings by 15. Most carbohydrate-rich foods have about 15 g of carbohydrates per serving. ? For example, if you eat 8 oz (170 g) of strawberries, you will have eaten 2 servings and 30 g of carbohydrates (2 servings x 15 g = 30 g).  For foods that have more than one food mixed, such as soups and casseroles, you must count the carbohydrates in each food that is included. The following list contains standard serving sizes of common carbohydrate-rich foods. Each of these servings has about 15 g of carbohydrates:   hamburger bun or  English muffin.   oz (15 mL) syrup.   oz (14 g) jelly.  1 slice of bread.  1 six-inch tortilla.  3 oz (85 g) cooked rice or pasta.  4 oz (113 g) cooked dried beans.  4 oz (113 g) starchy vegetable, such as peas, corn, or potatoes.  4 oz (113 g) hot cereal.  4 oz (113 g) mashed potatoes or  of a large baked potato.  4 oz (113 g) canned or frozen fruit.  4 oz (120 mL) fruit juice.  4-6 crackers.  6 chicken nuggets.  6 oz (170 g) unsweetened dry cereal.  6 oz (170 g) plain fat-free yogurt or yogurt sweetened with artificial sweeteners.  8 oz (240 mL) milk.  8 oz (170 g) fresh fruit or one small piece of fruit.  24 oz (680 g) popped popcorn. Example of carbohydrate counting Sample meal  3 oz (85 g) chicken breast.  6 oz (170 g)   brown rice.  4 oz (113 g) corn.  8 oz (240 mL) milk.  8 oz (170 g) strawberries with sugar-free whipped topping. Carbohydrate calculation 1. Identify the foods that contain carbohydrates: ? Rice. ? Corn. ? Milk. ? Strawberries. 2. Calculate how many servings you have of each food: ? 2 servings rice. ? 1 serving corn. ? 1 serving milk. ? 1 serving strawberries. 3. Multiply each number of servings by 15 g: ? 2 servings rice x 15 g = 30 g. ? 1 serving corn x 15 g = 15 g. ? 1 serving milk x 15 g = 15 g. ? 1 serving  strawberries x 15 g = 15 g. 4. Add together all of the amounts to find the total grams of carbohydrates eaten: ? 30 g + 15 g + 15 g + 15 g = 75 g of carbohydrates total. Summary  Carbohydrate counting is a method of keeping track of how many carbohydrates you eat.  Eating carbohydrates naturally increases the amount of sugar (glucose) in the blood.  Counting how many carbohydrates you eat helps keep your blood glucose within normal limits, which helps you manage your diabetes.  A diet and nutrition specialist (registered dietitian) can help you make a meal plan and calculate how many carbohydrates you should have at each meal and snack. This information is not intended to replace advice given to you by your health care provider. Make sure you discuss any questions you have with your health care provider. Document Revised: 08/15/2016 Document Reviewed: 07/05/2015 Elsevier Patient Education  2020 [employer] DASH Eating Plan DASH stands for "Dietary Approaches to Stop Hypertension." The DASH eating plan is a healthy eating plan that has been shown to reduce high blood pressure (hypertension). It may also reduce your risk for type 2 diabetes, heart disease, and stroke. The DASH eating plan may also help with weight loss. What are tips for following this plan?  General guidelines  Avoid eating more than 2,300 mg (milligrams) of salt (sodium) a day. If you have hypertension, you may need to reduce your sodium intake to 1,500 mg a day.  Limit alcohol intake to no more than 1 drink a day for nonpregnant women and 2 drinks a day for men. One drink equals 12 oz of beer, 5 oz of wine, or 1 oz of hard liquor.  Work with your health care provider to maintain a healthy body weight or to lose weight. Ask what an ideal weight is for you.  Get at least 30 minutes of exercise that causes your heart to beat faster (aerobic exercise) most days of the week. Activities may include walking, swimming, or  biking.  Work with your health care provider or diet and nutrition specialist (dietitian) to adjust your eating plan to your individual calorie needs. Reading food labels   Check food labels for the amount of sodium per serving. Choose foods with less than 5 percent of the Daily Value of sodium. Generally, foods with less than 300 mg of sodium per serving fit into this eating plan.  To find whole grains, look for the word "whole" as the first word in the ingredient list. Shopping  Buy products labeled as "low-sodium" or "no salt added."  Buy fresh foods. Avoid canned foods and premade or frozen meals. Cooking  Avoid adding salt when cooking. Use salt-free seasonings or herbs instead of table salt or sea salt. Check with your health care provider or pharmacist before using salt substitutes.  Do not   fry foods. Cook foods using healthy methods such as baking, boiling, grilling, and broiling instead.  Cook with heart-healthy oils, such as olive, canola, soybean, or sunflower oil. Meal planning  Eat a balanced diet that includes: ? 5 or more servings of fruits and vegetables each day. At each meal, try to fill half of your plate with fruits and vegetables. ? Up to 6-8 servings of whole grains each day. ? Less than 6 oz of lean meat, poultry, or fish each day. A 3-oz serving of meat is about the same size as a deck of cards. One egg equals 1 oz. ? 2 servings of low-fat dairy each day. ? A serving of nuts, seeds, or beans 5 times each week. ? Heart-healthy fats. Healthy fats called Omega-3 fatty acids are found in foods such as flaxseeds and coldwater fish, like sardines, salmon, and mackerel.  Limit how much you eat of the following: ? Canned or prepackaged foods. ? Food that is high in trans fat, such as fried foods. ? Food that is high in saturated fat, such as fatty meat. ? Sweets, desserts, sugary drinks, and other foods with added sugar. ? Full-fat dairy products.  Do not salt  foods before eating.  Try to eat at least 2 vegetarian meals each week.  Eat more home-cooked food and less restaurant, buffet, and fast food.  When eating at a restaurant, ask that your food be prepared with less salt or no salt, if possible. What foods are recommended? The items listed may not be a complete list. Talk with your dietitian about what dietary choices are best for you. Grains Whole-grain or whole-wheat bread. Whole-grain or whole-wheat pasta. Brown rice. Oatmeal. Quinoa. Bulgur. Whole-grain and low-sodium cereals. Pita bread. Low-fat, low-sodium crackers. Whole-wheat flour tortillas. Vegetables Fresh or frozen vegetables (raw, steamed, roasted, or grilled). Low-sodium or reduced-sodium tomato and vegetable juice. Low-sodium or reduced-sodium tomato sauce and tomato paste. Low-sodium or reduced-sodium canned vegetables. Fruits All fresh, dried, or frozen fruit. Canned fruit in natural juice (without added sugar). Meat and other protein foods Skinless chicken or turkey. Ground chicken or turkey. Pork with fat trimmed off. Fish and seafood. Egg whites. Dried beans, peas, or lentils. Unsalted nuts, nut butters, and seeds. Unsalted canned beans. Lean cuts of beef with fat trimmed off. Low-sodium, lean deli meat. Dairy Low-fat (1%) or fat-free (skim) milk. Fat-free, low-fat, or reduced-fat cheeses. Nonfat, low-sodium ricotta or cottage cheese. Low-fat or nonfat yogurt. Low-fat, low-sodium cheese. Fats and oils Soft margarine without trans fats. Vegetable oil. Low-fat, reduced-fat, or light mayonnaise and salad dressings (reduced-sodium). Canola, safflower, olive, soybean, and sunflower oils. Avocado. Seasoning and other foods Herbs. Spices. Seasoning mixes without salt. Unsalted popcorn and pretzels. Fat-free sweets. What foods are not recommended? The items listed may not be a complete list. Talk with your dietitian about what dietary choices are best for you. Grains Baked goods  made with fat, such as croissants, muffins, or some breads. Dry pasta or rice meal packs. Vegetables Creamed or fried vegetables. Vegetables in a cheese sauce. Regular canned vegetables (not low-sodium or reduced-sodium). Regular canned tomato sauce and paste (not low-sodium or reduced-sodium). Regular tomato and vegetable juice (not low-sodium or reduced-sodium). Pickles. Olives. Fruits Canned fruit in a light or heavy syrup. Fried fruit. Fruit in cream or butter sauce. Meat and other protein foods Fatty cuts of meat. Ribs. Fried meat. Bacon. Sausage. Bologna and other processed lunch meats. Salami. Fatback. Hotdogs. Bratwurst. Salted nuts and seeds. Canned beans with added salt.   Canned or smoked fish. Whole eggs or egg yolks. Chicken or turkey with skin. Dairy Whole or 2% milk, cream, and half-and-half. Whole or full-fat cream cheese. Whole-fat or sweetened yogurt. Full-fat cheese. Nondairy creamers. Whipped toppings. Processed cheese and cheese spreads. Fats and oils Butter. Stick margarine. Lard. Shortening. Ghee. Bacon fat. Tropical oils, such as coconut, palm kernel, or palm oil. Seasoning and other foods Salted popcorn and pretzels. Onion salt, garlic salt, seasoned salt, table salt, and sea salt. Worcestershire sauce. Tartar sauce. Barbecue sauce. Teriyaki sauce. Soy sauce, including reduced-sodium. Steak sauce. Canned and packaged gravies. Fish sauce. Oyster sauce. Cocktail sauce. Horseradish that you find on the shelf. Ketchup. Mustard. Meat flavorings and tenderizers. Bouillon cubes. Hot sauce and Tabasco sauce. Premade or packaged marinades. Premade or packaged taco seasonings. Relishes. Regular salad dressings. Where to find more information:  National Heart, Lung, and Blood Institute: www.nhlbi.nih.gov  American Heart Association: www.heart.org Summary  The DASH eating plan is a healthy eating plan that has been shown to reduce high blood pressure (hypertension). It may also reduce  your risk for type 2 diabetes, heart disease, and stroke.  With the DASH eating plan, you should limit salt (sodium) intake to 2,300 mg a day. If you have hypertension, you may need to reduce your sodium intake to 1,500 mg a day.  When on the DASH eating plan, aim to eat more fresh fruits and vegetables, whole grains, lean proteins, low-fat dairy, and heart-healthy fats.  Work with your health care provider or diet and nutrition specialist (dietitian) to adjust your eating plan to your individual calorie needs. This information is not intended to replace advice given to you by your health care provider. Make sure you discuss any questions you have with your health care provider. Document Revised: 01/03/2017 Document Reviewed: 01/15/2016 Elsevier Patient Education  2020 [employer]  

## 2019-07-06 ENCOUNTER — Ambulatory Visit: Payer: Self-pay

## 2019-07-06 DIAGNOSIS — J1282 Pneumonia due to coronavirus disease 2019: Secondary | ICD-10-CM

## 2019-07-06 HISTORY — DX: Pneumonia due to coronavirus disease 2019: J12.82

## 2019-07-21 ENCOUNTER — Encounter: Payer: Self-pay | Admitting: Emergency Medicine

## 2019-07-21 ENCOUNTER — Emergency Department: Payer: Medicare HMO

## 2019-07-21 ENCOUNTER — Other Ambulatory Visit: Payer: Self-pay

## 2019-07-21 ENCOUNTER — Emergency Department
Admission: EM | Admit: 2019-07-21 | Discharge: 2019-07-21 | Disposition: A | Discharge status: Home / Self Care | Payer: Medicare HMO | Attending: Emergency Medicine | Admitting: Emergency Medicine

## 2019-07-21 DIAGNOSIS — R05 Cough: Secondary | ICD-10-CM | POA: Diagnosis not present

## 2019-07-21 DIAGNOSIS — I10 Essential (primary) hypertension: Secondary | ICD-10-CM | POA: Diagnosis not present

## 2019-07-21 DIAGNOSIS — R438 Other disturbances of smell and taste: Secondary | ICD-10-CM | POA: Insufficient documentation

## 2019-07-21 DIAGNOSIS — Z794 Long term (current) use of insulin: Secondary | ICD-10-CM | POA: Diagnosis not present

## 2019-07-21 DIAGNOSIS — M7918 Myalgia, other site: Secondary | ICD-10-CM | POA: Diagnosis not present

## 2019-07-21 DIAGNOSIS — E119 Type 2 diabetes mellitus without complications: Secondary | ICD-10-CM | POA: Diagnosis not present

## 2019-07-21 DIAGNOSIS — Z7982 Long term (current) use of aspirin: Secondary | ICD-10-CM | POA: Diagnosis not present

## 2019-07-21 DIAGNOSIS — Z20822 Contact with and (suspected) exposure to covid-19: Secondary | ICD-10-CM

## 2019-07-21 DIAGNOSIS — U071 COVID-19: Secondary | ICD-10-CM | POA: Diagnosis not present

## 2019-07-21 DIAGNOSIS — Z79899 Other long term (current) drug therapy: Secondary | ICD-10-CM | POA: Diagnosis not present

## 2019-07-21 DIAGNOSIS — R0981 Nasal congestion: Secondary | ICD-10-CM | POA: Diagnosis not present

## 2019-07-21 LAB — SARS CORONAVIRUS 2 BY RT PCR (HOSPITAL ORDER, PERFORMED IN ~~LOC~~ HOSPITAL LAB): SARS Coronavirus 2: POSITIVE — AB

## 2019-07-21 MED ORDER — BENZONATATE 100 MG PO CAPS
ORAL_CAPSULE | ORAL | 0 refills | Status: DC
Start: 2019-07-21 — End: 2020-08-02

## 2019-07-21 MED ORDER — AZITHROMYCIN 250 MG PO TABS
250.0000 mg | ORAL_TABLET | Freq: Every day | ORAL | 0 refills | Status: AC
Start: 2019-07-21 — End: 2019-07-25

## 2019-07-21 MED ORDER — ALBUTEROL SULFATE HFA 108 (90 BASE) MCG/ACT IN AERS: 2.0000 | INHALATION_SPRAY | Freq: Four times a day (QID) | RESPIRATORY_TRACT | 0 refills | Status: DC | PRN

## 2019-07-21 MED ORDER — PREDNISONE 20 MG PO TABS
40.0000 mg | ORAL_TABLET | Freq: Every day | ORAL | 0 refills | Status: DC
Start: 2019-07-21 — End: 2019-08-01

## 2019-07-21 MED ORDER — AZITHROMYCIN 500 MG PO TABS
500.0000 mg | ORAL_TABLET | Freq: Once | ORAL | Status: AC
  Administered 2019-07-21: 500 mg via ORAL
  Filled 2019-07-21: qty 1

## 2019-07-21 NOTE — ED Provider Notes (Signed)
[facility] Emergency Department Provider Note ____________________________________________  Time seen: 1812  I have reviewed the triage vital signs and the nursing notes.  HISTORY  Chief Complaint  Nasal Congestion, Cough, Chills, and Generalized Body Aches  HPI Nathan Wong is a 64 y.o. male presents himself to the ED for concern for symptoms related to a possible Covid exposure.  Patient reports he was exposed on Sunday, and now has developed loss of taste and smell sensation.  He also describes some nasal congestion as well as a mild cough.  He describes overall general body aches but denies any frank fevers.  Patient medical history is consistent for type 2 diabetes hypertension, and hypothyroidism.   Past Medical History:  Diagnosis Date  . Arthritis   . Diabetes mellitus without complication ([place])   . H/O hernia repair   . Hypertension     Patient Active Problem List   Diagnosis Date Noted  . Acid reflux 05/13/2019  . Health care maintenance 11/10/2018  . Increased heart rate 11/10/2018  . Type 2 diabetes mellitus with diabetic neuropathy, unspecified ([place]) 10/22/2018  . Essential hypertension 10/22/2018  . Hypothyroidism 10/22/2018  . Need for dental care 10/22/2018  . Pericardial cyst 01/04/2016    Past Surgical History:  Procedure Laterality Date  . HERNIA REPAIR     abdominal    Prior to Admission medications   Medication Sig Start Date End Date Taking? Authorizing Provider  acetaminophen (TYLENOL) 500 MG tablet Take 500 mg by mouth every 6 (six) hours as needed for mild pain.     [provider]  albuterol (VENTOLIN HFA) 108 (90 Base) MCG/ACT inhaler Inhale 2 puffs into the lungs every 6 (six) hours as needed. 07/21/19   Shawndale Kilpatrick, Dannielle Karvonen, PA-C  aspirin EC 81 MG tablet Take 81 mg by mouth daily.    [provider]  azithromycin (ZITHROMAX Z-PAK) 250 MG tablet Take 1 tablet (250 mg total) by mouth daily for 4  days. 07/21/19 07/25/19  Thorsten Climer, Dannielle Karvonen, PA-C  benzonatate (TESSALON PERLES) 100 MG capsule Take 1-2 tabs TID prn cough 07/21/19   Porcia Morganti, Dannielle Karvonen, PA-C  blood glucose meter kit and supplies KIT Dispense based on patient and insurance preference. Use up to four times daily as directed. (FOR ICD-9 250.00, 250.01). 10/24/18   Iloabachie, Chioma E, NP  Blood Pressure KIT 1 kit by Does not apply route daily. 12/08/18   Iloabachie, Chioma E, NP  cholecalciferol (VITAMIN D3) 25 MCG (1000 UT) tablet Take 1,000 Units by mouth daily.    [provider]  gabapentin (NEURONTIN) 400 MG capsule Take 1 capsule (400 mg total) by mouth 3 (three) times daily. 12/08/18   Iloabachie, Chioma E, NP  glipiZIDE (GLUCOTROL) 5 MG tablet Take 1 tablet (5 mg total) by mouth in the morning. With food 05/13/19   Iloabachie, Chioma E, NP  Insulin Degludec 200 UNIT/ML SOPN Inject 20 Units into the skin at bedtime. Patient taking differently: Inject 20 Units into the skin in the morning.  12/08/18   Iloabachie, Chioma E, NP  levothyroxine (SYNTHROID) 50 MCG tablet Take 1 tablet (50 mcg total) by mouth daily before breakfast. 05/13/19   Iloabachie, Chioma E, NP  lisinopril (ZESTRIL) 10 MG tablet Take 1 tablet (10 mg total) by mouth daily. 06/08/19   Iloabachie, Chioma E, NP  metFORMIN (GLUCOPHAGE) 1000 MG tablet Take 1 tablet (1,000 mg total) by mouth 2 (two) times daily with a meal. 05/13/19  Iloabachie, Chioma E, NP  omeprazole (PRILOSEC) 20 MG capsule Take 1 capsule (20 mg total) by mouth daily. 06/08/19   Iloabachie, Chioma E, NP  predniSONE (DELTASONE) 20 MG tablet Take 2 tablets (40 mg total) by mouth daily with breakfast for 5 days. 07/21/19 07/26/19  Selwyn Reason, Dannielle Karvonen, PA-C    Allergies Patient has no known allergies.  Family History  Problem Relation Age of Onset  . CAD Mother   . Diabetes Sister   . Hypertension Sister   . Early death Neg Hx     Social History Social History   Tobacco Use  .  Smoking status: Never Smoker  . Smokeless tobacco: Never Used  Vaping Use  . Vaping Use: Never used  Substance Use Topics  . Alcohol use: No  . Drug use: No    Review of Systems  Constitutional: Negative for fever. Eyes: Negative for visual changes. ENT: Negative for sore throat.  Reports sinus congestion as above. Cardiovascular: Negative for chest pain. Respiratory: Negative for shortness of breath.  Reports mild cough. Gastrointestinal: Negative for abdominal pain, vomiting and diarrhea. Genitourinary: Negative for dysuria. Musculoskeletal: Negative for back pain. Skin: Negative for rash. Neurological: Negative for headaches, focal weakness or numbness.  Reports loss of taste and smell sensation. ____________________________________________  PHYSICAL EXAM:  VITAL SIGNS: ED Triage Vitals  Enc Vitals Group     BP 07/21/19 1747 114/71     Pulse Rate 07/21/19 1747 (!) 121     Resp 07/21/19 1747 18     Temp 07/21/19 1747 98.3 F (36.8 C)     Temp Source 07/21/19 1747 Oral     SpO2 07/21/19 1747 99 %     Weight 07/21/19 1741 210 lb (95.3 kg)     Height 07/21/19 1741 6' (1.829 m)     Head Circumference --      Peak Flow --      Pain Score 07/21/19 1741 6     Pain Loc --      Pain Edu? --      Excl. in [place]? --     Constitutional: Alert and oriented. Well appearing and in no distress. Head: Normocephalic and atraumatic. Eyes: Conjunctivae are normal. Normal extraocular movements Ears: Canals clear. TMs intact bilaterally. Cardiovascular: Normal rate, regular rhythm. Normal distal pulses. Respiratory: Normal respiratory effort. No wheezes/rales/rhonchi. Musculoskeletal: Nontender with normal range of motion in all extremities.  Neurologic:  Normal gait without ataxia. Normal speech and language. No gross focal neurologic deficits are appreciated. Skin:  Skin is warm, dry and intact. No rash noted. ____________________________________________   LABS (pertinent  positives/negatives) Labs Reviewed  SARS CORONAVIRUS 2 BY RT PCR (HOSPITAL ORDER, [facility])  ___________________________________________   RADIOLOGY  CXR  IMPRESSION: 1. No acute intrathoracic process. ____________________________________________  PROCEDURES  Azithromycin 500 mg PO  Procedures ____________________________________________  INITIAL IMPRESSION / ASSESSMENT AND PLAN / ED COURSE  Patient with a clinical picture concerning for Covid as well as a recent exposure, will be treated empirically for viral URI related to Covid infection.  He will take the prescribed albuterol inhaler, Tessalon Perles, prednisone, and azithromycin as described.  He is encouraged to follow-up with his primary provider and consider IV medical therapy infusion for management of his symptoms.  Return precautions have been reviewed.  JAMARKUS LISBON was evaluated in Emergency Department on 07/21/2019 for the symptoms described in the history of present illness. He was evaluated in the context of the global  COVID-19 pandemic, which necessitated consideration that the patient might be at risk for infection with the SARS-CoV-2 virus that causes COVID-19. Institutional protocols and algorithms that pertain to the evaluation of patients at risk for COVID-19 are in a state of rapid change based on information released by regulatory bodies including the CDC and federal and state organizations. These policies and algorithms were followed during the patient's care in the ED. ____________________________________________  FINAL CLINICAL IMPRESSION(S) / ED DIAGNOSES  Final diagnoses:  Clinical diagnosis of COVID-19  Close exposure to COVID-19 virus      Melvenia Needles, PA-C 07/21/19 1852    Arta Silence, MD 07/21/19 2028

## 2019-07-21 NOTE — ED Notes (Signed)
Pt contacted via cell phone and notified of positive covid 19 test result.  Pt verbalized understanding and had no further questions.

## 2019-07-21 NOTE — Discharge Instructions (Addendum)
You are being treated for COVID. Take the prescription meds as directed. Follow-up with your provider or return as needed. Remain under house quarantine until symptoms have improved and 14 days have passed.

## 2019-07-21 NOTE — ED Notes (Addendum)
Pt states he was with a friend who has the coronavirus now. Patient states he has a sore throat, headache, nausea, chills, and body aches. Pt denies fevers and diarrhea. Pt states that he feels shob when he's walking.

## 2019-07-21 NOTE — ED Triage Notes (Signed)
Pt reports was exposed to COVID on Sunday and now has no taste or smell, has nasal congestion and a cough and overall bodyaches. Pt reports unsure of fever.

## 2019-07-22 ENCOUNTER — Telehealth: Payer: Self-pay | Admitting: Unknown Physician Specialty

## 2019-07-22 NOTE — Telephone Encounter (Signed)
  I connected by phone with Nathan Wong on 07/22/2019 at 7:53 AM to discuss the potential use of an new treatment for mild to moderate COVID-19 viral infection in non-hospitalized patients.  This patient is a 64 y.o. male that meets the FDA criteria for Emergency Use Authorization of bamlanivimab/etesevimab or casirivimab/imdevimab.  Has a (+) direct SARS-CoV-2 viral test result  Has mild or moderate COVID-19   Is NOT hospitalized due to COVID-19  Is within 10 days of symptom onset  Has at least one of the high risk factor(s) for progression to severe COVID-19 and/or hospitalization as defined in EUA.  Specific high risk criteria : Diabetes   I have spoken and communicated the following to the patient or parent/caregiver:  1. FDA has authorized the emergency use of bamlanivimab/etesevimab and casirivimab\imdevimab for the treatment of mild to moderate COVID-19 in adults and pediatric patients with positive results of direct SARS-CoV-2 viral testing who are 39 years of age and older weighing at least 40 kg, and who are at high risk for progressing to severe COVID-19 and/or hospitalization.  2. The significant known and potential risks and benefits of bamlanivimab/etesevimab and casirivimab\imdevimab, and the extent to which such potential risks and benefits are unknown.  3. Information on available alternative treatments and the risks and benefits of those alternatives, including clinical trials.  4. Patients treated with bamlanivimab/etesevimab and casirivimab\imdevimab should continue to self-isolate and use infection control measures (e.g., wear mask, isolate, social distance, avoid sharing personal items, clean and disinfect "high touch" surfaces, and frequent handwashing) according to CDC guidelines.   5. The patient or parent/caregiver has the option to accept or refuse bamlanivimab/etesevimab or casirivimab\imdevimab .  After reviewing this information with the patient and  daughter in law, they wanted to check out the [facility] infusion center as [place] ,[name] 07/22/2019 7:53 AM Sx onset 6/14

## 2019-07-26 ENCOUNTER — Encounter: Payer: Self-pay | Admitting: Emergency Medicine

## 2019-07-26 ENCOUNTER — Emergency Department: Payer: Medicare HMO

## 2019-07-26 ENCOUNTER — Other Ambulatory Visit: Payer: Self-pay

## 2019-07-26 ENCOUNTER — Inpatient Hospital Stay
Admission: EM | Admit: 2019-07-26 | Discharge: 2019-08-01 | DRG: 177 | Disposition: A | Discharge status: Home Health | Payer: Medicare HMO | Attending: Internal Medicine | Admitting: Internal Medicine

## 2019-07-26 DIAGNOSIS — E1165 Type 2 diabetes mellitus with hyperglycemia: Secondary | ICD-10-CM | POA: Diagnosis not present

## 2019-07-26 DIAGNOSIS — R000 Tachycardia, unspecified: Secondary | ICD-10-CM

## 2019-07-26 DIAGNOSIS — R52 Pain, unspecified: Secondary | ICD-10-CM

## 2019-07-26 DIAGNOSIS — E860 Dehydration: Secondary | ICD-10-CM | POA: Diagnosis present

## 2019-07-26 DIAGNOSIS — Z8249 Family history of ischemic heart disease and other diseases of the circulatory system: Secondary | ICD-10-CM

## 2019-07-26 DIAGNOSIS — R110 Nausea: Secondary | ICD-10-CM | POA: Diagnosis not present

## 2019-07-26 DIAGNOSIS — E1142 Type 2 diabetes mellitus with diabetic polyneuropathy: Secondary | ICD-10-CM | POA: Diagnosis present

## 2019-07-26 DIAGNOSIS — N179 Acute kidney failure, unspecified: Secondary | ICD-10-CM

## 2019-07-26 DIAGNOSIS — R42 Dizziness and giddiness: Secondary | ICD-10-CM | POA: Diagnosis not present

## 2019-07-26 DIAGNOSIS — Z794 Long term (current) use of insulin: Secondary | ICD-10-CM

## 2019-07-26 DIAGNOSIS — E871 Hypo-osmolality and hyponatremia: Secondary | ICD-10-CM | POA: Diagnosis not present

## 2019-07-26 DIAGNOSIS — J189 Pneumonia, unspecified organism: Secondary | ICD-10-CM | POA: Diagnosis not present

## 2019-07-26 DIAGNOSIS — E1122 Type 2 diabetes mellitus with diabetic chronic kidney disease: Secondary | ICD-10-CM | POA: Diagnosis present

## 2019-07-26 DIAGNOSIS — R531 Weakness: Secondary | ICD-10-CM | POA: Diagnosis not present

## 2019-07-26 DIAGNOSIS — I951 Orthostatic hypotension: Secondary | ICD-10-CM | POA: Diagnosis not present

## 2019-07-26 DIAGNOSIS — U071 COVID-19: Secondary | ICD-10-CM | POA: Diagnosis not present

## 2019-07-26 DIAGNOSIS — Z833 Family history of diabetes mellitus: Secondary | ICD-10-CM | POA: Diagnosis not present

## 2019-07-26 DIAGNOSIS — M79662 Pain in left lower leg: Secondary | ICD-10-CM | POA: Diagnosis not present

## 2019-07-26 DIAGNOSIS — J9621 Acute and chronic respiratory failure with hypoxia: Secondary | ICD-10-CM | POA: Diagnosis not present

## 2019-07-26 DIAGNOSIS — M79604 Pain in right leg: Secondary | ICD-10-CM

## 2019-07-26 DIAGNOSIS — Z79899 Other long term (current) drug therapy: Secondary | ICD-10-CM

## 2019-07-26 DIAGNOSIS — E1140 Type 2 diabetes mellitus with diabetic neuropathy, unspecified: Secondary | ICD-10-CM | POA: Diagnosis not present

## 2019-07-26 DIAGNOSIS — Z79890 Hormone replacement therapy (postmenopausal): Secondary | ICD-10-CM | POA: Diagnosis not present

## 2019-07-26 DIAGNOSIS — Z7982 Long term (current) use of aspirin: Secondary | ICD-10-CM

## 2019-07-26 DIAGNOSIS — E119 Type 2 diabetes mellitus without complications: Secondary | ICD-10-CM | POA: Diagnosis not present

## 2019-07-26 DIAGNOSIS — J9601 Acute respiratory failure with hypoxia: Secondary | ICD-10-CM | POA: Diagnosis not present

## 2019-07-26 DIAGNOSIS — M79661 Pain in right lower leg: Secondary | ICD-10-CM | POA: Diagnosis not present

## 2019-07-26 DIAGNOSIS — I129 Hypertensive chronic kidney disease with stage 1 through stage 4 chronic kidney disease, or unspecified chronic kidney disease: Secondary | ICD-10-CM | POA: Diagnosis present

## 2019-07-26 DIAGNOSIS — J1282 Pneumonia due to coronavirus disease 2019: Secondary | ICD-10-CM

## 2019-07-26 DIAGNOSIS — E039 Hypothyroidism, unspecified: Secondary | ICD-10-CM | POA: Diagnosis not present

## 2019-07-26 DIAGNOSIS — I10 Essential (primary) hypertension: Secondary | ICD-10-CM

## 2019-07-26 DIAGNOSIS — R05 Cough: Secondary | ICD-10-CM | POA: Diagnosis not present

## 2019-07-26 DIAGNOSIS — N189 Chronic kidney disease, unspecified: Secondary | ICD-10-CM

## 2019-07-26 DIAGNOSIS — N182 Chronic kidney disease, stage 2 (mild): Secondary | ICD-10-CM | POA: Diagnosis present

## 2019-07-26 DIAGNOSIS — R0902 Hypoxemia: Secondary | ICD-10-CM

## 2019-07-26 DIAGNOSIS — R1013 Epigastric pain: Secondary | ICD-10-CM | POA: Diagnosis not present

## 2019-07-26 DIAGNOSIS — R0602 Shortness of breath: Secondary | ICD-10-CM | POA: Diagnosis not present

## 2019-07-26 LAB — CBC
HCT: 45 % (ref 39.0–52.0)
HCT: 42.1 % (ref 39.0–52.0)
Hemoglobin: 15.8 g/dL (ref 13.0–17.0)
Hemoglobin: 14.6 g/dL (ref 13.0–17.0)
MCH: 29.4 pg (ref 26.0–34.0)
MCH: 29.1 pg (ref 26.0–34.0)
MCHC: 35.1 g/dL (ref 30.0–36.0)
MCHC: 34.7 g/dL (ref 30.0–36.0)
MCV: 83.8 fL (ref 80.0–100.0)
MCV: 83.9 fL (ref 80.0–100.0)
Platelets: 153 10*3/uL (ref 150–400)
Platelets: 128 10*3/uL — ABNORMAL LOW (ref 150–400)
RBC: 5.37 MIL/uL (ref 4.22–5.81)
RBC: 5.02 MIL/uL (ref 4.22–5.81)
RDW: 13.8 % (ref 11.5–15.5)
RDW: 13.7 % (ref 11.5–15.5)
WBC: 6.6 10*3/uL (ref 4.0–10.5)
WBC: 5.8 10*3/uL (ref 4.0–10.5)
nRBC: 0 % (ref 0.0–0.2)
nRBC: 0 % (ref 0.0–0.2)

## 2019-07-26 LAB — LACTIC ACID, PLASMA
Lactic Acid, Venous: 2.3 mmol/L (ref 0.5–1.9)
Lactic Acid, Venous: 1.8 mmol/L (ref 0.5–1.9)

## 2019-07-26 LAB — BASIC METABOLIC PANEL
Anion gap: 15 (ref 5–15)
BUN: 45 mg/dL — ABNORMAL HIGH (ref 8–23)
CO2: 23 mmol/L (ref 22–32)
Calcium: 9.4 mg/dL (ref 8.9–10.3)
Chloride: 96 mmol/L — ABNORMAL LOW (ref 98–111)
Creatinine, Ser: 1.71 mg/dL — ABNORMAL HIGH (ref 0.61–1.24)
GFR calc Af Amer: 48 mL/min — ABNORMAL LOW (ref >60)
GFR calc non Af Amer: 41 mL/min — ABNORMAL LOW (ref >60)
Glucose, Bld: 142 mg/dL — ABNORMAL HIGH (ref 70–99)
Potassium: 4.6 mmol/L (ref 3.5–5.1)
Sodium: 134 mmol/L — ABNORMAL LOW (ref 135–145)

## 2019-07-26 LAB — CK: Total CK: 66 U/L (ref 49–397)

## 2019-07-26 LAB — HIV ANTIBODY (ROUTINE TESTING W REFLEX): HIV Screen 4th Generation wRfx: NONREACTIVE

## 2019-07-26 LAB — TROPONIN I (HIGH SENSITIVITY): Troponin I (High Sensitivity): 5 ng/L (ref <18)

## 2019-07-26 LAB — CREATININE, SERUM
Creatinine, Ser: 1.54 mg/dL — ABNORMAL HIGH (ref 0.61–1.24)
GFR calc Af Amer: 54 mL/min — ABNORMAL LOW (ref >60)
GFR calc non Af Amer: 47 mL/min — ABNORMAL LOW (ref >60)

## 2019-07-26 LAB — ABO/RH: ABO/RH(D): O POS

## 2019-07-26 MED ORDER — ALBUTEROL SULFATE HFA 108 (90 BASE) MCG/ACT IN AERS
2.0000 | INHALATION_SPRAY | Freq: Four times a day (QID) | RESPIRATORY_TRACT | Status: DC
  Administered 2019-07-26 – 2019-08-01 (×23): 2 via RESPIRATORY_TRACT
  Filled 2019-07-26: qty 6.7

## 2019-07-26 MED ORDER — DEXAMETHASONE SODIUM PHOSPHATE 10 MG/ML IJ SOLN
6.0000 mg | INTRAMUSCULAR | Status: DC
  Administered 2019-07-26 – 2019-07-31 (×6): 6 mg via INTRAVENOUS
  Filled 2019-07-26 (×6): qty 1

## 2019-07-26 MED ORDER — INSULIN GLARGINE 100 UNIT/ML ~~LOC~~ SOLN
15.0000 [IU] | Freq: Every day | SUBCUTANEOUS | Status: DC
  Administered 2019-07-26 – 2019-07-31 (×6): 15 [IU] via SUBCUTANEOUS
  Filled 2019-07-26 (×7): qty 0.15

## 2019-07-26 MED ORDER — MORPHINE SULFATE (PF) 4 MG/ML IV SOLN
4.0000 mg | Freq: Once | INTRAVENOUS | Status: AC
  Administered 2019-07-26: 4 mg via INTRAVENOUS
  Filled 2019-07-26: qty 1

## 2019-07-26 MED ORDER — SODIUM CHLORIDE 0.9 % IV SOLN: INTRAVENOUS | Status: DC

## 2019-07-26 MED ORDER — SODIUM CHLORIDE 0.9 % IV BOLUS
500.0000 mL | Freq: Once | INTRAVENOUS | Status: AC
  Administered 2019-07-26: 500 mL via INTRAVENOUS

## 2019-07-26 MED ORDER — DEXAMETHASONE SODIUM PHOSPHATE 10 MG/ML IJ SOLN
10.0000 mg | Freq: Once | INTRAMUSCULAR | Status: AC
  Administered 2019-07-26: 10 mg via INTRAVENOUS
  Filled 2019-07-26: qty 1

## 2019-07-26 MED ORDER — ADULT MULTIVITAMIN W/MINERALS CH
1.0000 | ORAL_TABLET | Freq: Every day | ORAL | Status: DC
  Administered 2019-07-26 – 2019-08-01 (×7): 1 via ORAL
  Filled 2019-07-26 (×7): qty 1

## 2019-07-26 MED ORDER — SODIUM CHLORIDE 0.9 % IV SOLN
200.0000 mg | Freq: Once | INTRAVENOUS | Status: AC
  Administered 2019-07-26: 200 mg via INTRAVENOUS
  Filled 2019-07-26: qty 40

## 2019-07-26 MED ORDER — SODIUM CHLORIDE 0.9 % IV SOLN
100.0000 mg | Freq: Every day | INTRAVENOUS | Status: AC
  Administered 2019-07-27 – 2019-07-30 (×4): 100 mg via INTRAVENOUS
  Filled 2019-07-26 (×4): qty 20

## 2019-07-26 MED ORDER — SODIUM CHLORIDE 0.9 % IV BOLUS
1000.0000 mL | Freq: Once | INTRAVENOUS | Status: AC
  Administered 2019-07-26: 1000 mL via INTRAVENOUS

## 2019-07-26 MED ORDER — ONDANSETRON HCL 4 MG/2ML IJ SOLN
4.0000 mg | Freq: Once | INTRAMUSCULAR | Status: AC
  Administered 2019-07-26: 4 mg via INTRAVENOUS
  Filled 2019-07-26: qty 2

## 2019-07-26 MED ORDER — LACTATED RINGERS IV SOLN: INTRAVENOUS | Status: DC

## 2019-07-26 MED ORDER — INSULIN ASPART 100 UNIT/ML ~~LOC~~ SOLN
0.0000 [IU] | Freq: Three times a day (TID) | SUBCUTANEOUS | Status: DC
  Administered 2019-07-27: 5 [IU] via SUBCUTANEOUS
  Administered 2019-07-27: 2 [IU] via SUBCUTANEOUS
  Administered 2019-07-27: 13:00:00 3 [IU] via SUBCUTANEOUS
  Administered 2019-07-28 – 2019-07-29 (×3): 2 [IU] via SUBCUTANEOUS
  Administered 2019-07-29 (×2): 3 [IU] via SUBCUTANEOUS
  Administered 2019-07-30: 2 [IU] via SUBCUTANEOUS
  Administered 2019-07-30 (×2): 3 [IU] via SUBCUTANEOUS
  Administered 2019-07-31: 5 [IU] via SUBCUTANEOUS
  Administered 2019-07-31: 15:00:00 3 [IU] via SUBCUTANEOUS
  Administered 2019-07-31 – 2019-08-01 (×2): 5 [IU] via SUBCUTANEOUS
  Administered 2019-08-01: 3 [IU] via SUBCUTANEOUS
  Filled 2019-07-26 (×16): qty 1

## 2019-07-26 MED ORDER — ENOXAPARIN SODIUM 40 MG/0.4ML ~~LOC~~ SOLN
40.0000 mg | SUBCUTANEOUS | Status: DC
  Administered 2019-07-26 – 2019-07-31 (×6): 40 mg via SUBCUTANEOUS
  Filled 2019-07-26 (×6): qty 0.4

## 2019-07-26 MED ORDER — IOHEXOL 350 MG/ML SOLN
60.0000 mL | Freq: Once | INTRAVENOUS | Status: AC | PRN
  Administered 2019-07-26: 60 mL via INTRAVENOUS
  Filled 2019-07-26: qty 60

## 2019-07-26 NOTE — ED Notes (Signed)
Called pt's family and updated per pt's request with pt's plan of care.

## 2019-07-26 NOTE — Progress Notes (Signed)
Remdesivir - Pharmacy Brief Note   O:  CXR: ground glass opacities   A/P:  Remdesivir 200 mg IVPB once followed by 100 mg IVPB daily x 4 days.   Clovia Cuff, PharmD, BCPS 07/26/2019 4:49 PM

## 2019-07-26 NOTE — ED Notes (Signed)
While taking orthostatic vitals, patient became very nauseous and shaky when changing positions. Not comfortable ambulating around room at this time. MD made aware.

## 2019-07-26 NOTE — ED Triage Notes (Signed)
Patient to ER for c/o +Covid 07/21/19. States he feels like he is not getting any better and has continued weakness and cough. Repeats worsening shortness of breath on ambulation.

## 2019-07-26 NOTE — H&P (Signed)
Nathan Wong is an 64 y.o. male.   Chief Complaint: Shortness of breath. HPI:  Patient is 63 year old male with history of essential hypertension and type 2 diabetes who present to the hospital with chief complaints of shortness of breath.  Symptoms started about 6 days ago, at that time, he developed fatigue, nausea, poor appetite.  He also lost taste and smell.  He did not have any diarrhea.  He did not have any short of breath at that time.  He tested positive for COVID-19 on 6/16.  He did not come to the hospital.  Then he developed shortness of breath and cough.  Symptom progressively getting worse.  For the last 2 or 3 days, patient can barely move.  His sleep in a recliner at nighttime.  He has not been eating and drinking for the last 3 days, he lost about 10 pounds of body weight in 3 days.  He feels dizzy and lightheaded.  He has a headache. Upon arriving the hospital, he was not requiring oxygen, he was found to have acute kidney injury with creatinine of 1.7.  CT scan showed scattered groundglass changes associate with pleural.  He is admitted to the hospital for further treatment.  Past Medical History:  Diagnosis Date  . Arthritis   . Diabetes mellitus without complication ([place])   . H/O hernia repair   . Hypertension     Past Surgical History:  Procedure Laterality Date  . HERNIA REPAIR     abdominal    Family History  Problem Relation Age of Onset  . CAD Mother   . Diabetes Sister   . Hypertension Sister   . Early death Neg Hx    Social History:  reports that he has never smoked. He has never used smokeless tobacco. He reports that he does not drink alcohol and does not use drugs.  Allergies: No Known Allergies  (Not in a hospital admission)   Results for orders placed or performed during the hospital encounter of 07/26/19 (from the past 48 hour(s))  Basic metabolic panel     Status: Abnormal   Collection Time: 07/26/19 11:48 AM  Result Value Ref Range   Sodium  134 (L) 135 - 145 mmol/L   Potassium 4.6 3.5 - 5.1 mmol/L   Chloride 96 (L) 98 - 111 mmol/L   CO2 23 22 - 32 mmol/L   Glucose, Bld 142 (H) 70 - 99 mg/dL    Comment: Glucose reference range applies only to samples taken after fasting for at least 8 hours.   BUN 45 (H) 8 - 23 mg/dL   Creatinine, Ser 1.71 (H) 0.61 - 1.24 mg/dL   Calcium 9.4 8.9 - 10.3 mg/dL   GFR calc non Af Amer 41 (L) >60 mL/min   GFR calc Af Amer 48 (L) >60 mL/min   Anion gap 15 5 - 15    Comment: Performed at [facility], [address] [place], [place] [address]  CBC     Status: None   Collection Time: 07/26/19 11:48 AM  Result Value Ref Range   WBC 6.6 4.0 - 10.5 K/uL   RBC 5.37 4.22 - 5.81 MIL/uL   Hemoglobin 15.8 13.0 - 17.0 g/dL   HCT 45.0 39 - 52 %   MCV 83.8 80.0 - 100.0 fL   MCH 29.4 26.0 - 34.0 pg   MCHC 35.1 30.0 - 36.0 g/dL   RDW 13.8 11.5 - 15.5 %   Platelets 153 150 - 400  K/uL   nRBC 0.0 0.0 - 0.2 %    Comment: Performed at [facility], [address] [place], [place] [address]  Lactic acid, plasma     Status: Abnormal   Collection Time: 07/26/19 11:48 AM  Result Value Ref Range   Lactic Acid, Venous 2.3 (HH) 0.5 - 1.9 mmol/L    Comment: CRITICAL RESULT CALLED TO, READ BACK BY AND VERIFIED WITH STEPHANIE RUDD AT 1301 07/26/19 DAS Performed at [facility], [address] [place], [place] [address]   Troponin I (High Sensitivity)     Status: None   Collection Time: 07/26/19 11:48 AM  Result Value Ref Range   Troponin I (High Sensitivity) 5 <18 ng/L    Comment: (NOTE) Elevated high sensitivity troponin I (hsTnI) values and significant  changes across serial measurements may suggest ACS but many other  chronic and acute conditions are known to elevate hsTnI results.  Refer to the "Links" section for chest pain algorithms and additional  guidance. Performed at [facility], [address] [place], [place] [address]   CK     Status: None    Collection Time: 07/26/19 11:48 AM  Result Value Ref Range   Total CK 66 49.0 - 397.0 U/L    Comment: Performed at [facility], [address] [place], [place] [address]  Lactic acid, plasma     Status: None   Collection Time: 07/26/19  3:28 PM  Result Value Ref Range   Lactic Acid, Venous 1.8 0.5 - 1.9 mmol/L    Comment: Performed at [facility], [address], [place], [place] [address]   DG Chest 1 View  Result Date: 07/26/2019 CLINICAL DATA:  Weakness and cough. The patient was diagnosed with COVID-19 07/21/2019. EXAM: CHEST  1 VIEW COMPARISON:  Single-view of the chest 07/21/2019 and 01/04/2016. CT chest 01/04/2016 FINDINGS: Lungs clear. Heart size is normal. No pneumothorax or pleural fluid. No acute focal bony abnormality IMPRESSION: Negative chest. Electronically Signed   By: Drusilla Kanner M.D.   On: 07/26/2019 12:13   CT Angio Chest PE W and/or Wo Contrast  Result Date: 07/26/2019 CLINICAL DATA:  Dizziness and weakness. Recent diagnosis of COVID-19. EXAM: CT ANGIOGRAPHY CHEST WITH CONTRAST TECHNIQUE: Multidetector CT imaging of the chest was performed using the standard protocol during bolus administration of intravenous contrast. Multiplanar CT image reconstructions and MIPs were obtained to evaluate the vascular anatomy. CONTRAST:  60 mL OMNIPAQUE IOHEXOL 350 MG/ML SOLN COMPARISON:  Single-view of the chest earlier today. FINDINGS: Cardiovascular: Satisfactory opacification of the pulmonary arteries to the segmental level. There is calcific aortic and coronary atherosclerosis. No evidence of pulmonary embolism. Normal heart size. No pericardial effusion. Mediastinum/Nodes: No enlarged mediastinal, hilar, or axillary lymph nodes. Thyroid gland and trachea are unremarkable. The esophagus is patulous with wall thickening. Lungs/Pleura: Multifocal areas of ground-glass attenuating airspace disease are seen in the periphery of all lobes of both lungs consistent with  pneumonia. No pleural effusion. Upper Abdomen: Fatty infiltration of the liver is noted. Otherwise negative. Musculoskeletal: No acute or focal abnormality. Review of the MIP images confirms the above findings. IMPRESSION: Negative for pulmonary embolus. Ground-glass attenuating airspace disease with a peripheral predominance in both lungs is most consistent with COVID-19 pneumonia. Patulous esophagus with wall thickening suggestive of inflammatory change. Fatty infiltration of the liver. Aortic Atherosclerosis (ICD10-I70.0). Calcific coronary artery disease also noted Electronically Signed   By: Drusilla Kanner M.D.   On: 07/26/2019 14:55  Review of Systems  Constitutional: Positive for activity change, appetite change, fatigue and unexpected weight change. Negative for chills, diaphoresis and fever.  HENT: Positive for congestion. Negative for nosebleeds, postnasal drip, rhinorrhea and sinus pressure.   Eyes: Negative for pain and itching.  Respiratory: Positive for cough and shortness of breath. Negative for choking and wheezing.   Cardiovascular: Negative for chest pain, palpitations and leg swelling.  Gastrointestinal: Positive for nausea. Negative for abdominal distention, constipation, diarrhea and vomiting.  Endocrine: Negative for cold intolerance and heat intolerance.  Genitourinary: Negative for dysuria, flank pain, frequency and hematuria.  Musculoskeletal: Positive for myalgias. Negative for arthralgias and back pain.       Left hand pain.  Allergic/Immunologic: Negative for environmental allergies and food allergies.  Neurological: Positive for dizziness and light-headedness. Negative for seizures and syncope.  Psychiatric/Behavioral: Negative for agitation, confusion and hallucinations.    Blood pressure 112/70, pulse (!) 101, temperature 97.7 F (36.5 C), temperature source Oral, resp. rate 20, height 6' (1.829 m), weight 95.3 kg, SpO2 98 %. Physical Exam  Constitutional: He  is oriented to person, place, and time.  Non-toxic appearance. He does not appear ill. No distress.  HENT:  Head: Normocephalic and atraumatic.  Nose: Nose normal. No congestion.  Mouth/Throat: Mucous membranes are moist. Oropharynx is clear.  Eyes: Pupils are equal, round, and reactive to light. Conjunctivae are normal.  Cardiovascular: Normal rate, regular rhythm, normal heart sounds and normal pulses. Exam reveals no gallop.  No murmur heard. Respiratory: Effort normal. No respiratory distress. He has no wheezes. He has no rales.  GI: Soft. Bowel sounds are normal. He exhibits no distension. There is no abdominal tenderness.  Ventral hernia noted.  Musculoskeletal:        General: No swelling. Normal range of motion.     Cervical back: Normal range of motion and neck supple. No rigidity or tenderness.     Right lower leg: No edema.     Left lower leg: No edema.  Lymphadenopathy:    He has no cervical adenopathy.  Neurological: He is alert and oriented to person, place, and time. No cranial nerve deficit.  Skin: Skin is warm and dry. He is not diaphoretic.  Psychiatric: His behavior is normal. Mood normal.     Assessment/Plan #1.  Short of breath secondary to Covid viral pneumonia. Patient has significant short of breath since diagnosis of COVID-19.  Currently oxygen saturation is between 90 to 98%.  Due to severity of symptoms.  I will start steroids and remdesivir.  Also start a multivitamin.  We will also start prophylaxis for DVT with Lovenox.  Patient will be monitored closely on telemetry on MedSurg floor.  2.  Acute kidney injury. Appear to be secondary to dehydration from poor appetite and not drinking.  Continue IV fluids recheck renal function tomorrow.  3.  Mild hyponatremia. Secondary to dehydration.  Continue fluids with normal saline.  #4.  Uncontrolled type 2 diabetes with hyperglycemia. I will give reduced dose of Lantus and sliding scale insulin.  Anticipating  worsening climate control by tomorrow due to steroids.  Will adjust dose tomorrow.  5. Essential hypertension. Continue some home medicines.  6.  Prophylaxis. We will start Lovenox.  Marrion Coy, MD 07/26/2019, 5:17 PM

## 2019-07-26 NOTE — ED Provider Notes (Signed)
[facility] Emergency Department Provider Note  ____________________________________________  Time seen: Approximately 2:27 PM  I have reviewed the triage vital signs and the nursing notes.   HISTORY  Chief Complaint Weakness    HPI Nathan Wong is a 64 y.o. male with a history of diabetes and hypertension who comes the ED complaining of diffuse body aches, generalized weakness, nonproductive cough, worsening shortness of breath, all gradual onset and worsening over the past 5 days, constant.  Worse with ambulation.  He reports over last 2 to 3 days he has had loss of appetite, not eating or drinking anything and also onset of diffuse burning chest pain which is nonradiating, not exertional, but is pleuritic.  Denies any history of DVT or PE, no blood thinner use.  No recent hospitalizations surgeries or trauma.  No long travel.   Denies vomiting or diarrhea.  Does report that he gets lightheaded with standing and is unable to walk or take care of himself at home due to the severity of symptoms.  His family members are all unvaccinated, so they are staying out of the house.    Past Medical History:  Diagnosis Date  . Arthritis   . Diabetes mellitus without complication ([place])   . H/O hernia repair   . Hypertension      Patient Active Problem List   Diagnosis Date Noted  . Acid reflux 05/13/2019  . Health care maintenance 11/10/2018  . Increased heart rate 11/10/2018  . Type 2 diabetes mellitus with diabetic neuropathy, unspecified ([place]) 10/22/2018  . Essential hypertension 10/22/2018  . Hypothyroidism 10/22/2018  . Need for dental care 10/22/2018  . Pericardial cyst 01/04/2016     Past Surgical History:  Procedure Laterality Date  . HERNIA REPAIR     abdominal     Prior to Admission medications   Medication Sig Start Date End Date Taking? Authorizing Provider  acetaminophen (TYLENOL) 500 MG tablet Take 500 mg by mouth every 6 (six) hours  as needed for mild pain.     [provider]  albuterol (VENTOLIN HFA) 108 (90 Base) MCG/ACT inhaler Inhale 2 puffs into the lungs every 6 (six) hours as needed. 07/21/19   Menshew, Dannielle Karvonen, PA-C  aspirin EC 81 MG tablet Take 81 mg by mouth daily.    [provider]  benzonatate (TESSALON PERLES) 100 MG capsule Take 1-2 tabs TID prn cough 07/21/19   Menshew, Dannielle Karvonen, PA-C  blood glucose meter kit and supplies KIT Dispense based on patient and insurance preference. Use up to four times daily as directed. (FOR ICD-9 250.00, 250.01). 10/24/18   Iloabachie, Chioma E, NP  Blood Pressure KIT 1 kit by Does not apply route daily. 12/08/18   Iloabachie, Chioma E, NP  cholecalciferol (VITAMIN D3) 25 MCG (1000 UT) tablet Take 1,000 Units by mouth daily.    [provider]  gabapentin (NEURONTIN) 400 MG capsule Take 1 capsule (400 mg total) by mouth 3 (three) times daily. 12/08/18   Iloabachie, Chioma E, NP  glipiZIDE (GLUCOTROL) 5 MG tablet Take 1 tablet (5 mg total) by mouth in the morning. With food 05/13/19   Iloabachie, Chioma E, NP  Insulin Degludec 200 UNIT/ML SOPN Inject 20 Units into the skin at bedtime. Patient taking differently: Inject 20 Units into the skin in the morning.  12/08/18   Iloabachie, Chioma E, NP  levothyroxine (SYNTHROID) 50 MCG tablet Take 1 tablet (50 mcg total) by mouth daily before breakfast. 05/13/19  Iloabachie, Chioma E, NP  lisinopril (ZESTRIL) 10 MG tablet Take 1 tablet (10 mg total) by mouth daily. 06/08/19   Iloabachie, Chioma E, NP  metFORMIN (GLUCOPHAGE) 1000 MG tablet Take 1 tablet (1,000 mg total) by mouth 2 (two) times daily with a meal. 05/13/19   Iloabachie, Chioma E, NP  omeprazole (PRILOSEC) 20 MG capsule Take 1 capsule (20 mg total) by mouth daily. 06/08/19   Iloabachie, Chioma E, NP  predniSONE (DELTASONE) 20 MG tablet Take 2 tablets (40 mg total) by mouth daily with breakfast for 5 days. 07/21/19 07/26/19  Menshew, Dannielle Karvonen, PA-C      Allergies Patient has no known allergies.   Family History  Problem Relation Age of Onset  . CAD Mother   . Diabetes Sister   . Hypertension Sister   . Early death Neg Hx     Social History Social History   Tobacco Use  . Smoking status: Never Smoker  . Smokeless tobacco: Never Used  Vaping Use  . Vaping Use: Never used  Substance Use Topics  . Alcohol use: No  . Drug use: No    Review of Systems Constitutional:   No fever positive chills.  ENT:   No sore throat. No rhinorrhea. Cardiovascular: Positive chest pain as above without palpitations or syncope. Respiratory:   Positive shortness of breath and nonproductive cough. Gastrointestinal:   Negative for abdominal pain, vomiting and diarrhea.  Musculoskeletal:   Negative for focal pain or swelling All other systems reviewed and are negative except as documented above in ROS and HPI.  ____________________________________________   PHYSICAL EXAM:  VITAL SIGNS: ED Triage Vitals  Enc Vitals Group     BP 07/26/19 1142 109/69     Pulse Rate 07/26/19 1142 (!) 113     Resp 07/26/19 1142 20     Temp 07/26/19 1142 97.7 F (36.5 C)     Temp Source 07/26/19 1142 Oral     SpO2 07/26/19 1142 98 %     Weight 07/26/19 1143 210 lb 1.6 oz (95.3 kg)     Height 07/26/19 1143 6' (1.829 m)     Head Circumference --      Peak Flow --      Pain Score 07/26/19 1142 2     Pain Loc --      Pain Edu? --      Excl. in [place]? --     Vital signs reviewed, nursing assessments reviewed.   Constitutional:   Alert and oriented. Non-toxic appearance. Eyes:   Conjunctivae are normal. EOMI. PERRL. ENT      Head:   Normocephalic and atraumatic.      Nose: Normal      Mouth/Throat:   Dry mucous membranes      Neck:   No meningismus. Full ROM. Hematological/Lymphatic/Immunilogical:   No cervical lymphadenopathy. Cardiovascular:   Tachycardia heart rate 102. Symmetric bilateral radial and DP pulses.  No murmurs. Cap refill less than  2 seconds. Respiratory:   Normal respiratory effort without tachypnea/retractions.  Fine crackles bilateral bases.  No wheezing Gastrointestinal:   Soft and nontender. Non distended. There is no CVA tenderness.  No rebound, rigidity, or guarding.  Musculoskeletal:   Normal range of motion in all extremities. No joint effusions.  No lower extremity tenderness.  No edema. Neurologic:   Normal speech and language.  Motor grossly intact. No acute focal neurologic deficits are appreciated.  Skin:    Skin is warm, dry and intact. No rash  noted.  No petechiae, purpura, or bullae.  ____________________________________________    LABS (pertinent positives/negatives) (all labs ordered are listed, but only abnormal results are displayed) Labs Reviewed  BASIC METABOLIC PANEL - Abnormal; Notable for the following components:      Result Value   Sodium 134 (*)    Chloride 96 (*)    Glucose, Bld 142 (*)    BUN 45 (*)    Creatinine, Ser 1.71 (*)    GFR calc non Af Amer 41 (*)    GFR calc Af Amer 48 (*)    All other components within normal limits  LACTIC ACID, PLASMA - Abnormal; Notable for the following components:   Lactic Acid, Venous 2.3 (*)    All other components within normal limits  CBC  CK  LACTIC ACID, PLASMA  TROPONIN I (HIGH SENSITIVITY)   ____________________________________________   EKG  Interpreted by me Sinus tachycardia rate 116.  Normal axis and intervals.  Normal QRS ST segments and T waves.  No acute ischemic changes.  ____________________________________________    RADIOLOGY  DG Chest 1 View  Result Date: 07/26/2019 CLINICAL DATA:  Weakness and cough. The patient was diagnosed with COVID-19 07/21/2019. EXAM: CHEST  1 VIEW COMPARISON:  Single-view of the chest 07/21/2019 and 01/04/2016. CT chest 01/04/2016 FINDINGS: Lungs clear. Heart size is normal. No pneumothorax or pleural fluid. No acute focal bony abnormality IMPRESSION: Negative chest. Electronically  Signed   By: Inge Rise M.D.   On: 07/26/2019 12:13   CT Angio Chest PE W and/or Wo Contrast  Result Date: 07/26/2019 CLINICAL DATA:  Dizziness and weakness. Recent diagnosis of COVID-19. EXAM: CT ANGIOGRAPHY CHEST WITH CONTRAST TECHNIQUE: Multidetector CT imaging of the chest was performed using the standard protocol during bolus administration of intravenous contrast. Multiplanar CT image reconstructions and MIPs were obtained to evaluate the vascular anatomy. CONTRAST:  60 mL OMNIPAQUE IOHEXOL 350 MG/ML SOLN COMPARISON:  Single-view of the chest earlier today. FINDINGS: Cardiovascular: Satisfactory opacification of the pulmonary arteries to the segmental level. There is calcific aortic and coronary atherosclerosis. No evidence of pulmonary embolism. Normal heart size. No pericardial effusion. Mediastinum/Nodes: No enlarged mediastinal, hilar, or axillary lymph nodes. Thyroid gland and trachea are unremarkable. The esophagus is patulous with wall thickening. Lungs/Pleura: Multifocal areas of ground-glass attenuating airspace disease are seen in the periphery of all lobes of both lungs consistent with pneumonia. No pleural effusion. Upper Abdomen: Fatty infiltration of the liver is noted. Otherwise negative. Musculoskeletal: No acute or focal abnormality. Review of the MIP images confirms the above findings. IMPRESSION: Negative for pulmonary embolus. Ground-glass attenuating airspace disease with a peripheral predominance in both lungs is most consistent with COVID-19 pneumonia. Patulous esophagus with wall thickening suggestive of inflammatory change. Fatty infiltration of the liver. Aortic Atherosclerosis (ICD10-I70.0). Calcific coronary artery disease also noted Electronically Signed   By: Inge Rise M.D.   On: 07/26/2019 14:55    ____________________________________________   PROCEDURES Procedures  ____________________________________________  DIFFERENTIAL DIAGNOSIS   COVID-19  pneumonia, bacterial pneumonia, pulmonary embolism, dehydration, electrolyte abnormality, viral syndrome  CLINICAL IMPRESSION / ASSESSMENT AND PLAN / ED COURSE  Medications ordered in the ED: Medications  dexamethasone (DECADRON) injection 10 mg (has no administration in time range)  sodium chloride 0.9 % bolus 500 mL (has no administration in time range)  sodium chloride 0.9 % bolus 1,000 mL (0 mLs Intravenous Stopped 07/26/19 1522)  morphine 4 MG/ML injection 4 mg (4 mg Intravenous Given 07/26/19 1351)  ondansetron (ZOFRAN) injection 4  mg (4 mg Intravenous Given 07/26/19 1350)  iohexol (OMNIPAQUE) 350 MG/ML injection 60 mL (60 mLs Intravenous Contrast Given 07/26/19 1417)    Pertinent labs & imaging results that were available during my care of the patient were reviewed by me and considered in my medical decision making (see chart for details).  Nathan Wong was evaluated in Emergency Department on 07/26/2019 for the symptoms described in the history of present illness. He was evaluated in the context of the global COVID-19 pandemic, which necessitated consideration that the patient might be at risk for infection with the SARS-CoV-2 virus that causes COVID-19. Institutional protocols and algorithms that pertain to the evaluation of patients at risk for COVID-19 are in a state of rapid change based on information released by regulatory bodies including the CDC and federal and state organizations. These policies and algorithms were followed during the patient's care in the ED.     Clinical Course as of Jul 26 1639  Mon Jul 26, 2019  1425 Patient presents with generalized weakness, shortness of breath and burning chest pain, gradually worsening over the last several days in the setting of recent Covid diagnosis.  Concern for superimposed pneumonia versus pulmonary embolism.  Clinically he appears dehydrated, I will treat with IV fluids, morphine 4 mg IV for pain relief, Zofran 4 mg IV for nausea  relief.  Will obtain CT angiogram of the chest to evaluate for PE or occult pneumonia given nondiagnostic chest x-ray.  He is not septic on initial assessment.   [PS]  9996 Orthostatics positive for decrease in blood pressure and tachycardia.  Nurse reports the patient was still unsteady on his feet with weakness that he was unable to ambulate even after the IV fluid bolus.  Unable to test ambulatory pulse ox.  Symptomatically patient is not feeling improved.  Repeat lactate is normalized.  Will need to hospitalize given his age and comorbidities, start Decadron and remdesivir given inflammatory changes due to Covid seen on CT scan, which was negative for PE.   [PS]    Clinical Course User Index [PS] Carrie Mew, MD     ____________________________________________   FINAL CLINICAL IMPRESSION(S) / ED DIAGNOSES    Final diagnoses:  COVID-19 virus infection  Type 2 diabetes mellitus without complication, with long-term current use of insulin ([place])  AKI (acute kidney injury) ([place])  Generalized weakness     ED Discharge Orders    None      Portions of this note were generated with dragon dictation software. Dictation errors may occur despite best attempts at proofreading.   Carrie Mew, MD 07/26/19 [phone]

## 2019-07-27 LAB — FIBRIN DERIVATIVES D-DIMER (ARMC ONLY): Fibrin derivatives D-dimer (ARMC): 537.69 ng{FEU}/mL — ABNORMAL HIGH (ref 0.00–499.00)

## 2019-07-27 LAB — COMPREHENSIVE METABOLIC PANEL
ALT: 30 U/L (ref 0–44)
AST: 47 U/L — ABNORMAL HIGH (ref 15–41)
Albumin: 3.2 g/dL — ABNORMAL LOW (ref 3.5–5.0)
Alkaline Phosphatase: 67 U/L (ref 38–126)
Anion gap: 7 (ref 5–15)
BUN: 34 mg/dL — ABNORMAL HIGH (ref 8–23)
CO2: 23 mmol/L (ref 22–32)
Calcium: 8.3 mg/dL — ABNORMAL LOW (ref 8.9–10.3)
Chloride: 104 mmol/L (ref 98–111)
Creatinine, Ser: 1.23 mg/dL (ref 0.61–1.24)
GFR calc Af Amer: >60 mL/min (ref >60)
GFR calc non Af Amer: >60 mL/min (ref >60)
Glucose, Bld: 190 mg/dL — ABNORMAL HIGH (ref 70–99)
Potassium: 4.9 mmol/L (ref 3.5–5.1)
Sodium: 134 mmol/L — ABNORMAL LOW (ref 135–145)
Total Bilirubin: 1.1 mg/dL (ref 0.3–1.2)
Total Protein: 6.7 g/dL (ref 6.5–8.1)

## 2019-07-27 LAB — PHOSPHORUS: Phosphorus: 3.5 mg/dL (ref 2.5–4.6)

## 2019-07-27 LAB — MAGNESIUM: Magnesium: 1.5 mg/dL — ABNORMAL LOW (ref 1.7–2.4)

## 2019-07-27 LAB — GLUCOSE, CAPILLARY
Glucose-Capillary: 149 mg/dL — ABNORMAL HIGH (ref 70–99)
Glucose-Capillary: 148 mg/dL — ABNORMAL HIGH (ref 70–99)
Glucose-Capillary: 209 mg/dL — ABNORMAL HIGH (ref 70–99)
Glucose-Capillary: 130 mg/dL — ABNORMAL HIGH (ref 70–99)

## 2019-07-27 LAB — C-REACTIVE PROTEIN: CRP: 4 mg/dL — ABNORMAL HIGH

## 2019-07-27 MED ORDER — ENSURE ENLIVE PO LIQD
237.0000 mL | Freq: Two times a day (BID) | ORAL | Status: DC
  Administered 2019-07-27 – 2019-08-01 (×8): 237 mL via ORAL

## 2019-07-27 MED ORDER — MAGNESIUM SULFATE 4 GM/100ML IV SOLN
4.0000 g | Freq: Once | INTRAVENOUS | Status: AC
  Administered 2019-07-27: 4 g via INTRAVENOUS
  Filled 2019-07-27 (×2): qty 100

## 2019-07-27 MED ORDER — ASCORBIC ACID 500 MG PO TABS
500.0000 mg | ORAL_TABLET | Freq: Every day | ORAL | Status: DC
  Administered 2019-07-28 – 2019-08-01 (×5): 500 mg via ORAL
  Filled 2019-07-27 (×5): qty 1

## 2019-07-27 MED ORDER — ZINC SULFATE 220 (50 ZN) MG PO CAPS
220.0000 mg | ORAL_CAPSULE | Freq: Every day | ORAL | Status: DC
  Administered 2019-07-28 – 2019-08-01 (×5): 220 mg via ORAL
  Filled 2019-07-27 (×5): qty 1

## 2019-07-27 MED ORDER — PRO-STAT SUGAR FREE PO LIQD
30.0000 mL | Freq: Two times a day (BID) | ORAL | Status: DC
  Administered 2019-07-27 – 2019-07-31 (×9): 30 mL via ORAL

## 2019-07-27 MED ORDER — ONDANSETRON HCL 4 MG/2ML IJ SOLN
4.0000 mg | Freq: Four times a day (QID) | INTRAMUSCULAR | Status: DC | PRN
  Administered 2019-07-28: 4 mg via INTRAVENOUS
  Filled 2019-07-27 (×2): qty 2

## 2019-07-27 MED ORDER — ACETAMINOPHEN 325 MG PO TABS
650.0000 mg | ORAL_TABLET | Freq: Four times a day (QID) | ORAL | Status: DC | PRN
  Administered 2019-07-27: 650 mg via ORAL

## 2019-07-27 MED ORDER — LEVOTHYROXINE SODIUM 50 MCG PO TABS
50.0000 ug | ORAL_TABLET | Freq: Every day | ORAL | Status: DC
  Administered 2019-07-28 – 2019-08-01 (×5): 50 ug via ORAL
  Filled 2019-07-27 (×5): qty 1

## 2019-07-27 NOTE — Progress Notes (Signed)
Patient ID: Nathan Wong, male   DOB: 09/28/1955, 64 y.o.   MRN: [id number]  PROGRESS NOTE    Nathan Wong  SNK:[id number] DOB: 10-25-1955 DOA: 07/26/2019 PCP: Corky Downs, MD   Brief Narrative:  64 year old male with history of essential hypertension diabetes mellitus type 2 presented with worsening shortness of breath.  He was tested positive for COVID-19 on 07/21/2019.  On presentation, he was found to have acute kidney injury with creatinine of 1.7 and CT showed scattered groundglass opacities.  He was started on remdesivir and Decadron.  Assessment & Plan:   COVID-19 pneumonia -Patient tested positive for COVID-19 on 07/21/2019.  CT of the chest on presentation was negative for pulmonary embolism but showed scattered groundglass opacities with possible esophageal thickening -Continue remdesivir and Decadron. -Monitor daily inflammatory markers -Oxygen supplementation as needed.  Currently on room air -Incentive spirometry  Acute kidney injury -Probably from poor oral intake and dehydration.  Improved.  DC IV fluids  Mild hyponatremia -Stable.  Monitor.  Encourage oral intake  Diabetes mellitus type 2 -Continue Lantus along with CBGs with SSI  Essential hypertension -Monitor blood pressure.    Esophageal thickening seen on CT of the chest -will use Protonix 40 mg daily.    DVT prophylaxis: Lovenox Code Status: Full Family Communication: Patient at bedside Disposition Plan: Status is: Inpatient  Remains inpatient appropriate because:IV treatments appropriate due to intensity of illness or inability to take PO   Dispo: The patient is from: Home              Anticipated d/c is to: Home              Anticipated d/c date is: > 3 days              Patient currently is not medically stable to d/c.   Consultants: None  Procedures: None  Antimicrobials:  Remdesivir from 07/26/2019 onwards  Subjective: Patient seen and examined at bedside.  He feels only slightly  better.  Still feels short of breath with minimal exertion.No worsening cough.  No overnight fever.  Objective: Vitals:   07/26/19 1730 07/26/19 1959 07/26/19 2245 07/27/19 0818  BP: 114/69 112/72 (!) 141/70 120/69  Pulse:  80 86 85  Resp:  18 18 16   Temp:  98.5 F (36.9 C) 98.2 F (36.8 C) 97.9 F (36.6 C)  TempSrc:  Oral    SpO2:  97% 98% 96%  Weight:      Height:        Intake/Output Summary (Last 24 hours) at 07/27/2019 1142 Last data filed at 07/27/2019 0701 Gross per 24 hour  Intake 1709.95 ml  Output 1700 ml  Net 9.95 ml   Filed Weights   07/26/19 1143  Weight: 95.3 kg    Examination:  General exam: Appears calm and comfortable  Respiratory system: Bilateral decreased breath sounds at bases with some scattered crackles Cardiovascular system: S1 & S2 heard, Rate controlled Gastrointestinal system: Abdomen is nondistended, soft and nontender. Normal bowel sounds heard. Extremities: No cyanosis, clubbing, edema  Central nervous system: Alert and oriented. No focal neurological deficits. Moving extremities Skin: No rashes, lesions or ulcers Psychiatry: Judgement and insight appear normal. Mood & affect appropriate.     Data Reviewed: I have personally reviewed following labs and imaging studies  CBC: Recent Labs  Lab 07/26/19 1148 07/26/19 1730  WBC 6.6 5.8  HGB 15.8 14.6  HCT 45.0 42.1  MCV 83.8 83.9  PLT 153 128*  Basic Metabolic Panel: Recent Labs  Lab 07/26/19 1148 07/26/19 1730 07/27/19 0541  NA 134*  --  134*  K 4.6  --  4.9  CL 96*  --  104  CO2 23  --  23  GLUCOSE 142*  --  190*  BUN 45*  --  34*  CREATININE 1.71* 1.54* 1.23  CALCIUM 9.4  --  8.3*  MG  --   --  1.5*  PHOS  --   --  3.5   GFR: Estimated Creatinine Clearance: 72.7 mL/min (by C-G formula based on SCr of 1.23 mg/dL). Liver Function Tests: Recent Labs  Lab 07/27/19 0541  AST 47*  ALT 30  ALKPHOS 67  BILITOT 1.1  PROT 6.7  ALBUMIN 3.2*   No results for  input(s): LIPASE, AMYLASE in the last 168 hours. No results for input(s): AMMONIA in the last 168 hours. Coagulation Profile: No results for input(s): INR, PROTIME in the last 168 hours. Cardiac Enzymes: Recent Labs  Lab 07/26/19 1148  CKTOTAL 66   BNP (last 3 results) No results for input(s): PROBNP in the last 8760 hours. HbA1C: No results for input(s): HGBA1C in the last 72 hours. CBG: Recent Labs  Lab 07/27/19 0810  GLUCAP 149*   Lipid Profile: No results for input(s): CHOL, HDL, LDLCALC, TRIG, CHOLHDL, LDLDIRECT in the last 72 hours. Thyroid Function Tests: No results for input(s): TSH, T4TOTAL, FREET4, T3FREE, THYROIDAB in the last 72 hours. Anemia Panel: No results for input(s): VITAMINB12, FOLATE, FERRITIN, TIBC, IRON, RETICCTPCT in the last 72 hours. Sepsis Labs: Recent Labs  Lab 07/26/19 1148 07/26/19 1528  LATICACIDVEN 2.3* 1.8    Recent Results (from the past 240 hour(s))  SARS Coronavirus 2 by RT PCR (hospital order, performed in [facility] hospital lab) Nasopharyngeal Nasopharyngeal Swab     Status: Abnormal   Collection Time: 07/21/19  6:23 PM   Specimen: Nasopharyngeal Swab  Result Value Ref Range Status   SARS Coronavirus 2 POSITIVE (A) NEGATIVE Final    Comment: RESULT CALLED TO, READ BACK BY AND VERIFIED WITH: ALEX CARRICO ON 07/21/19 AT 1913 QSD (NOTE) SARS-CoV-2 target nucleic acids are DETECTED  SARS-CoV-2 RNA is generally detectable in upper respiratory specimens  during the acute phase of infection.  Positive results are indicative  of the presence of the identified virus, but do not rule out bacterial infection or co-infection with other pathogens not detected by the test.  Clinical correlation with patient history and  other diagnostic information is necessary to determine patient infection status.  The expected result is negative.  Fact Sheet for Patients:   StrictlyIdeas.no   Fact Sheet for Healthcare  Providers:   BankingDealers.co.za    This test is not yet approved or cleared by the [place] FDA and  has been authorized for detection and/or diagnosis of SARS-CoV-2 by FDA under an Emergency Use Authorization (EUA).  This EUA will remain in effect (meaning this t est can be used) for the duration of  the COVID-19 declaration under Section 564(b)(1) of the Act, 21 U.S.C. section 360-bbb-3(b)(1), unless the authorization is terminated or revoked sooner.  Performed at [facility], [address], [place], [place] [address]          Radiology Studies: DG Chest 1 View  Result Date: 07/26/2019 CLINICAL DATA:  Weakness and cough. The patient was diagnosed with COVID-19 07/21/2019. EXAM: CHEST  1 VIEW COMPARISON:  Single-view of the chest 07/21/2019 and 01/04/2016. CT chest 01/04/2016 FINDINGS: Lungs clear.  Heart size is normal. No pneumothorax or pleural fluid. No acute focal bony abnormality IMPRESSION: Negative chest. Electronically Signed   By: Drusilla Kanner M.D.   On: 07/26/2019 12:13   CT Angio Chest PE W and/or Wo Contrast  Result Date: 07/26/2019 CLINICAL DATA:  Dizziness and weakness. Recent diagnosis of COVID-19. EXAM: CT ANGIOGRAPHY CHEST WITH CONTRAST TECHNIQUE: Multidetector CT imaging of the chest was performed using the standard protocol during bolus administration of intravenous contrast. Multiplanar CT image reconstructions and MIPs were obtained to evaluate the vascular anatomy. CONTRAST:  60 mL OMNIPAQUE IOHEXOL 350 MG/ML SOLN COMPARISON:  Single-view of the chest earlier today. FINDINGS: Cardiovascular: Satisfactory opacification of the pulmonary arteries to the segmental level. There is calcific aortic and coronary atherosclerosis. No evidence of pulmonary embolism. Normal heart size. No pericardial effusion. Mediastinum/Nodes: No enlarged mediastinal, hilar, or axillary lymph nodes. Thyroid gland and trachea are unremarkable. The  esophagus is patulous with wall thickening. Lungs/Pleura: Multifocal areas of ground-glass attenuating airspace disease are seen in the periphery of all lobes of both lungs consistent with pneumonia. No pleural effusion. Upper Abdomen: Fatty infiltration of the liver is noted. Otherwise negative. Musculoskeletal: No acute or focal abnormality. Review of the MIP images confirms the above findings. IMPRESSION: Negative for pulmonary embolus. Ground-glass attenuating airspace disease with a peripheral predominance in both lungs is most consistent with COVID-19 pneumonia. Patulous esophagus with wall thickening suggestive of inflammatory change. Fatty infiltration of the liver. Aortic Atherosclerosis (ICD10-I70.0). Calcific coronary artery disease also noted Electronically Signed   By: Drusilla Kanner M.D.   On: 07/26/2019 14:55        Scheduled Meds: . albuterol  2 puff Inhalation Q6H  . dexamethasone (DECADRON) injection  6 mg Intravenous Q24H  . enoxaparin (LOVENOX) injection  40 mg Subcutaneous Q24H  . feeding supplement (ENSURE ENLIVE)  237 mL Oral BID BM  . feeding supplement (PRO-STAT SUGAR FREE 64)  30 mL Oral BID  . insulin aspart  0-15 Units Subcutaneous TID WC  . insulin glargine  15 Units Subcutaneous QHS  . multivitamin with minerals  1 tablet Oral Daily   Continuous Infusions: . sodium chloride 100 mL/hr at 07/27/19 0400  . remdesivir 100 mg in NS 100 mL 100 mg (07/27/19 0900)          Glade Lloyd, MD [facility] 07/27/2019, 11:42 AM

## 2019-07-27 NOTE — Progress Notes (Signed)
Initial Nutrition Assessment  DOCUMENTATION CODES:   Not applicable  INTERVENTION:  Ensure Enlive po BID, each supplement provides 350 kcal and 20 grams of protein (chocolate) Prostat 30 ml po BID, each supplement provides 100 kcal and 15 grams of protein Liberalize diet Encouraged po intake of meals and supplements   NUTRITION DIAGNOSIS:   Increased nutrient needs related to catabolic illness (pneumonia secondary to COVID-19 virus infection) as evidenced by estimated needs.    GOAL:   Patient will meet greater than or equal to 90% of their needs    MONITOR:   PO intake, Supplement acceptance, Labs, Weight trends, Diet advancement, I & O's  REASON FOR ASSESSMENT:   Malnutrition Screening Tool    ASSESSMENT:  RD working remotely.  64 year old male admitted for pneumonia secondary to COVID-19 virus infection with past medical history of DM2, HTN, and arthritis.  Per chart review, patient developed fatigue, nausea, poor appetite with loss of taste and smell ~6 days ago and tested positive for COVID-19 on 07/21/19. Patient developed worsening shortness of breath and cough over the past 2-3 days, CT chest consistent with covid pneumonia.   Patient is on a heart healthy diet, per flowsheets he has eaten 95% x 1 documented intake. Current diet order restricts protein, spoke with MD via secure chat and will liberalize to regular diet. RD was able to speak with patient via phone this morning. He reports good po intake of breakfast, recalls having eggs, toast, sausage, and diet coke. Prior to getting sick, pt states his appetite was real good recalling 3 meals/day with lunch being his biggest meal. He endorses poor intake over the past week, unable to taste/smell food and states his stomach was weak. Patient reports ongoing loss of smell, however he is able to taste. Patient states that he would really like to have a cheeseburger with onions and fries for lunch. RD educated on increased  needs, encouraged po intake of meals and supplements, pt amenable to drinking chocolate Ensure to aid with meeting needs.   Per chart, weights have trended down 7 lbs (3.2%) over the past 3 months which is insignificant for time frame. UBW 217-223 lb (2017-2020)  Medications reviewed and include: SSI, Lantus 15 units at bedtime, Decadron, MVI IVF: NaCl @ 100 ml/hr IVPB: Remdesivir Labs: CBG 149, Na 134 (L), BUN 34 (H), Mg 1.5 (L) K/Phos - WNL  NUTRITION - FOCUSED PHYSICAL EXAM: Unable to complete at this time, RD working remotely due to positive COVID-19    Diet Order:   Diet Order            Diet Heart Room service appropriate? Yes; Fluid consistency: Thin  Diet effective now                 EDUCATION NEEDS:   Education needs have been addressed  Skin:  Skin Assessment: Reviewed RN Assessment  Last BM:  pta  Height:   Ht Readings from Last 1 Encounters:  07/26/19 6' (1.829 m)    Weight:   Wt Readings from Last 1 Encounters:  07/26/19 95.3 kg    BMI:  Body mass index is 28.49 kg/m.  Estimated Nutritional Needs:   Kcal:  2400-2700  Protein:  120-135  Fluid:  >/= 2.4 L/day    Lars Masson, RD, LDN Clinical Nutrition After Hours/Weekend Pager # in Amion

## 2019-07-28 DIAGNOSIS — R110 Nausea: Secondary | ICD-10-CM

## 2019-07-28 DIAGNOSIS — E871 Hypo-osmolality and hyponatremia: Secondary | ICD-10-CM

## 2019-07-28 DIAGNOSIS — E119 Type 2 diabetes mellitus without complications: Secondary | ICD-10-CM

## 2019-07-28 DIAGNOSIS — R1013 Epigastric pain: Secondary | ICD-10-CM

## 2019-07-28 DIAGNOSIS — Z794 Long term (current) use of insulin: Secondary | ICD-10-CM

## 2019-07-28 DIAGNOSIS — E039 Hypothyroidism, unspecified: Secondary | ICD-10-CM

## 2019-07-28 LAB — COMPREHENSIVE METABOLIC PANEL
ALT: 30 U/L (ref 0–44)
AST: 48 U/L — ABNORMAL HIGH (ref 15–41)
Albumin: 3.6 g/dL (ref 3.5–5.0)
Alkaline Phosphatase: 72 U/L (ref 38–126)
Anion gap: 11 (ref 5–15)
BUN: 31 mg/dL — ABNORMAL HIGH (ref 8–23)
CO2: 24 mmol/L (ref 22–32)
Calcium: 9 mg/dL (ref 8.9–10.3)
Chloride: 99 mmol/L (ref 98–111)
Creatinine, Ser: 1.13 mg/dL (ref 0.61–1.24)
GFR calc Af Amer: >60 mL/min (ref >60)
GFR calc non Af Amer: >60 mL/min (ref >60)
Glucose, Bld: 165 mg/dL — ABNORMAL HIGH (ref 70–99)
Potassium: 4.6 mmol/L (ref 3.5–5.1)
Sodium: 134 mmol/L — ABNORMAL LOW (ref 135–145)
Total Bilirubin: 1.1 mg/dL (ref 0.3–1.2)
Total Protein: 7.2 g/dL (ref 6.5–8.1)

## 2019-07-28 LAB — GLUCOSE, CAPILLARY
Glucose-Capillary: 145 mg/dL — ABNORMAL HIGH (ref 70–99)
Glucose-Capillary: 119 mg/dL — ABNORMAL HIGH (ref 70–99)
Glucose-Capillary: 146 mg/dL — ABNORMAL HIGH (ref 70–99)
Glucose-Capillary: 177 mg/dL — ABNORMAL HIGH (ref 70–99)

## 2019-07-28 LAB — HEMOGLOBIN A1C
Hgb A1c MFr Bld: 6.9 % — ABNORMAL HIGH (ref 4.8–5.6)
Mean Plasma Glucose: 151 mg/dL

## 2019-07-28 LAB — FIBRIN DERIVATIVES D-DIMER (ARMC ONLY): Fibrin derivatives D-dimer (ARMC): 517.28 ng/mL (FEU) — ABNORMAL HIGH (ref 0.00–499.00)

## 2019-07-28 LAB — C-REACTIVE PROTEIN: CRP: 3.3 mg/dL — ABNORMAL HIGH (ref <1.0)

## 2019-07-28 LAB — MAGNESIUM: Magnesium: 2 mg/dL (ref 1.7–2.4)

## 2019-07-28 LAB — PHOSPHORUS: Phosphorus: 3.4 mg/dL (ref 2.5–4.6)

## 2019-07-28 MED ORDER — FAMOTIDINE 20 MG PO TABS
20.0000 mg | ORAL_TABLET | Freq: Every day | ORAL | Status: DC
  Administered 2019-07-28 – 2019-08-01 (×5): 20 mg via ORAL
  Filled 2019-07-28 (×5): qty 1

## 2019-07-28 MED ORDER — PROMETHAZINE HCL 25 MG/ML IJ SOLN
12.5000 mg | Freq: Once | INTRAMUSCULAR | Status: AC
  Administered 2019-07-28: 14:00:00 12.5 mg via INTRAVENOUS
  Filled 2019-07-28: qty 1

## 2019-07-28 MED ORDER — ALUM & MAG HYDROXIDE-SIMETH 200-200-20 MG/5ML PO SUSP
30.0000 mL | Freq: Four times a day (QID) | ORAL | Status: DC | PRN
  Administered 2019-07-28: 18:00:00 30 mL via ORAL
  Filled 2019-07-28: qty 30

## 2019-07-28 NOTE — Progress Notes (Signed)
Patient ID: Nathan Wong, male   DOB: [birth year], 64 y.o.   MRN: [id number] [facility] PROGRESS NOTE  Nathan Wong [id number] DOB: 1955-10-09 DOA: 07/26/2019 PCP: Nathan Athens, MD  HPI/Subjective: Admitted with shortness of breath secondary to Covid infection.  Patient this morning was feeling nauseous and felt like he was going to vomit.  Did not feel like eating much.  Some abdominal discomfort.  Objective: Vitals:   07/28/19 1300 07/28/19 1728  BP: 125/76   Pulse: 90   Resp: (!) 25   Temp: 98.1 F (36.7 C) 98.2 F (36.8 C)  SpO2: 92%     Intake/Output Summary (Last 24 hours) at 07/28/2019 1735 Last data filed at 07/28/2019 1001 Gross per 24 hour  Intake --  Output 600 ml  Net -600 ml   Filed Weights   07/26/19 1143  Weight: 95.3 kg    ROS: Review of Systems  Respiratory: Positive for cough and shortness of breath.   Cardiovascular: Negative for chest pain.  Gastrointestinal: Positive for abdominal pain and nausea. Negative for diarrhea and vomiting.   Exam: Physical Exam  HENT:  Nose: No mucosal edema.  Mouth/Throat: No oropharyngeal exudate.  Eyes: Pupils are equal, round, and reactive to light. Conjunctivae and lids are normal.  Cardiovascular: Normal rate, S1 normal and S2 normal. Exam reveals no gallop.  No murmur heard. Respiratory: No respiratory distress. He has decreased breath sounds in the right lower field and the left lower field. He has no wheezes. He has no rhonchi. He has no rales.  GI: Soft. There is abdominal tenderness in the epigastric area.  Musculoskeletal:     Right ankle: No swelling.     Left ankle: No swelling.  Neurological: He is alert. No cranial nerve deficit.  Skin: Skin is warm. No rash noted. Nails show no clubbing.      Data Reviewed: Basic Metabolic Panel: Recent Labs  Lab 07/26/19 1148 07/26/19 1730 07/27/19 0541 07/28/19 0555  NA 134*  --  134* 134*  K 4.6  --  4.9 4.6  CL 96*  --  104 99  CO2 23  --   23 24  GLUCOSE 142*  --  190* 165*  BUN 45*  --  34* 31*  CREATININE 1.71* 1.54* 1.23 1.13  CALCIUM 9.4  --  8.3* 9.0  MG  --   --  1.5* 2.0  PHOS  --   --  3.5 3.4   Liver Function Tests: Recent Labs  Lab 07/27/19 0541 07/28/19 0555  AST 47* 48*  ALT 30 30  ALKPHOS 67 72  BILITOT 1.1 1.1  PROT 6.7 7.2  ALBUMIN 3.2* 3.6   CBC: Recent Labs  Lab 07/26/19 1148 07/26/19 1730  WBC 6.6 5.8  HGB 15.8 14.6  HCT 45.0 42.1  MCV 83.8 83.9  PLT 153 128*   Cardiac Enzymes: Recent Labs  Lab 07/26/19 1148  CKTOTAL 66    CBG: Recent Labs  Lab 07/27/19 1642 07/27/19 2140 07/28/19 0849 07/28/19 1259 07/28/19 1726  GLUCAP 209* 130* 145* 119* 146*    Recent Results (from the past 240 hour(s))  SARS Coronavirus 2 by RT PCR (hospital order, performed in [facility] hospital lab) Nasopharyngeal Nasopharyngeal Swab     Status: Abnormal   Collection Time: 07/21/19  6:23 PM   Specimen: Nasopharyngeal Swab  Result Value Ref Range Status   SARS Coronavirus 2 POSITIVE (A) NEGATIVE Final    Comment: RESULT CALLED TO, READ BACK BY AND  VERIFIED WITH: Nathan Wong ON 07/21/19 AT 1913 QSD (NOTE) SARS-CoV-2 target nucleic acids are DETECTED  SARS-CoV-2 RNA is generally detectable in upper respiratory specimens  during the acute phase of infection.  Positive results are indicative  of the presence of the identified virus, but do not rule out bacterial infection or co-infection with other pathogens not detected by the test.  Clinical correlation with patient history and  other diagnostic information is necessary to determine patient infection status.  The expected result is negative.  Fact Sheet for Patients:   BoilerBrush.com.cy   Fact Sheet for Healthcare Providers:   https://pope.com/    This test is not yet approved or cleared by the [place] FDA and  has been authorized for detection and/or diagnosis of SARS-CoV-2 by FDA  under an Emergency Use Authorization (EUA).  This EUA will remain in effect (meaning this t est can be used) for the duration of  the COVID-19 declaration under Section 564(b)(1) of the Act, 21 U.S.C. section 360-bbb-3(b)(1), unless the authorization is terminated or revoked sooner.  Performed at [facility], [address] [place], [place] [address]       Scheduled Meds: . albuterol  2 puff Inhalation Q6H  . vitamin C  500 mg Oral Daily  . dexamethasone (DECADRON) injection  6 mg Intravenous Q24H  . enoxaparin (LOVENOX) injection  40 mg Subcutaneous Q24H  . famotidine  20 mg Oral Daily  . feeding supplement (ENSURE ENLIVE)  237 mL Oral BID BM  . feeding supplement (PRO-STAT SUGAR FREE 64)  30 mL Oral BID  . insulin aspart  0-15 Units Subcutaneous TID WC  . insulin glargine  15 Units Subcutaneous QHS  . levothyroxine  50 mcg Oral Q0600  . multivitamin with minerals  1 tablet Oral Daily  . zinc sulfate  220 mg Oral Daily   Continuous Infusions: . remdesivir 100 mg in NS 100 mL 100 mg (07/28/19 1001)    Assessment/Plan:  1. COVID-19 pneumonia.  Continue remdesivir.  Today is day 3 of remdesivir.  Continue Decadron.  Continue vitamin C and zinc. 2. Nausea and epigastric abdominal pain.  Will give famotidine and Maalox.  Had to give Zofran earlier and a dose of Phenergan. 3. Hyponatremia.  This has improved 4. Type 2 diabetes mellitus on glargine insulin and sliding scale 5. Hypothyroidism unspecified on levothyroxine 6. Acute kidney injury.  Creatinine improved from 1.71 down to 1.13.  Recheck tomorrow.    Code Status:     Code Status Orders  (From admission, onward)         Start     Ordered   07/26/19 1709  Full code  Continuous        07/26/19 1715        Code Status History    Date Active Date Inactive Code Status Order ID Comments User Context   01/04/2016 0755 01/05/2016 1309 Full Code [id number]  Arnaldo Natal, MD ED   Advance Care Planning  Activity     Family Communication: Spoke with Daughter on the phone Disposition Plan: Status is: Inpatient  Dispo: The patient is from: Home              Anticipated d/c is to: Home              Anticipated d/c date is: hopefully 07/30/2019              Patient currently   Antibiotics:  Remdesivir  Time spent: 28 minutes  [place]  [facility] [employer]

## 2019-07-29 ENCOUNTER — Inpatient Hospital Stay: Payer: Medicare HMO

## 2019-07-29 DIAGNOSIS — N189 Chronic kidney disease, unspecified: Secondary | ICD-10-CM

## 2019-07-29 DIAGNOSIS — M79604 Pain in right leg: Secondary | ICD-10-CM

## 2019-07-29 DIAGNOSIS — R531 Weakness: Secondary | ICD-10-CM

## 2019-07-29 LAB — COMPREHENSIVE METABOLIC PANEL
ALT: 30 U/L (ref 0–44)
AST: 50 U/L — ABNORMAL HIGH (ref 15–41)
Albumin: 3.5 g/dL (ref 3.5–5.0)
Alkaline Phosphatase: 77 U/L (ref 38–126)
Anion gap: 12 (ref 5–15)
BUN: 33 mg/dL — ABNORMAL HIGH (ref 8–23)
CO2: 26 mmol/L (ref 22–32)
Calcium: 9 mg/dL (ref 8.9–10.3)
Chloride: 96 mmol/L — ABNORMAL LOW (ref 98–111)
Creatinine, Ser: 1.33 mg/dL — ABNORMAL HIGH (ref 0.61–1.24)
GFR calc Af Amer: >60 mL/min (ref >60)
GFR calc non Af Amer: 56 mL/min — ABNORMAL LOW (ref >60)
Glucose, Bld: 214 mg/dL — ABNORMAL HIGH (ref 70–99)
Potassium: 4.9 mmol/L (ref 3.5–5.1)
Sodium: 134 mmol/L — ABNORMAL LOW (ref 135–145)
Total Bilirubin: 1.2 mg/dL (ref 0.3–1.2)
Total Protein: 7.4 g/dL (ref 6.5–8.1)

## 2019-07-29 LAB — GLUCOSE, CAPILLARY
Glucose-Capillary: 176 mg/dL — ABNORMAL HIGH (ref 70–99)
Glucose-Capillary: 143 mg/dL — ABNORMAL HIGH (ref 70–99)
Glucose-Capillary: 219 mg/dL — ABNORMAL HIGH (ref 70–99)

## 2019-07-29 LAB — MAGNESIUM: Magnesium: 1.8 mg/dL (ref 1.7–2.4)

## 2019-07-29 LAB — PHOSPHORUS: Phosphorus: 4.9 mg/dL — ABNORMAL HIGH (ref 2.5–4.6)

## 2019-07-29 LAB — C-REACTIVE PROTEIN: CRP: 4.6 mg/dL — ABNORMAL HIGH (ref <1.0)

## 2019-07-29 LAB — FIBRIN DERIVATIVES D-DIMER (ARMC ONLY): Fibrin derivatives D-dimer (ARMC): 505.56 ng/mL (FEU) — ABNORMAL HIGH (ref 0.00–499.00)

## 2019-07-29 NOTE — Progress Notes (Signed)
PT Cancellation Note  Patient Details Name: Nathan Wong MRN: [id number] DOB: 25-May-1955   Cancelled Treatment:    Reason Eval/Treat Not Completed: Other (comment).  PT consult received.  Chart reviewed.  Pt currently pending B LE venous US (to r/o DVT).  D/t this, will hold PT at this time until testing is completed and results are known.  Will re-attempt PT evaluation at a later date/time as medically appropriate and as able.  Hendricks Limes, PT 07/29/19, 1:09 PM

## 2019-07-29 NOTE — Progress Notes (Signed)
Patient ID: Nathan Wong, male   DOB: Feb 22, 1955, 64 y.o.   MRN: [id number] [facility] PROGRESS NOTE  THURSTON BRENDLINGER OHY:[id number] DOB: [birth year] DOA: 07/26/2019 PCP: Cletis Athens, MD  HPI/Subjective: Patient admitted with Covid infection and shortness of breath.  Patient doing better with regards to his abdominal pain and nausea.  Objective: Vitals:   07/29/19 0412 07/29/19 0828  BP: 124/72 128/86  Pulse:  91  Resp:  18  Temp: 98.1 F (36.7 C) 97.9 F (36.6 C)  SpO2: 92% 92%    Intake/Output Summary (Last 24 hours) at 07/29/2019 1409 Last data filed at 07/29/2019 0500 Gross per 24 hour  Intake 200 ml  Output --  Net 200 ml   Filed Weights   07/26/19 1143  Weight: 95.3 kg    ROS: Review of Systems  Respiratory: Positive for cough and shortness of breath.   Cardiovascular: Negative for chest pain.  Gastrointestinal: Negative for abdominal pain.  Musculoskeletal: Positive for joint pain.   Exam: Physical Exam  HENT:  Nose: No mucosal edema.  Mouth/Throat: No oropharyngeal exudate.  Eyes: Pupils are equal, round, and reactive to light. Conjunctivae and lids are normal.  Cardiovascular: S1 normal and S2 normal. Exam reveals no gallop.  No murmur heard. Respiratory: No respiratory distress. He has decreased breath sounds in the right lower field and the left lower field. He has no wheezes. He has rhonchi in the right lower field and the left lower field. He has no rales.  GI: Soft. Bowel sounds are normal. There is no abdominal tenderness.  Musculoskeletal:     Right knee: No swelling.     Right ankle: No swelling.     Left ankle: No swelling.  Neurological: He is alert.  Skin: Skin is warm. No rash noted. Nails show no clubbing.      Data Reviewed: Basic Metabolic Panel: Recent Labs  Lab 07/26/19 1148 07/26/19 1730 07/27/19 0541 07/28/19 0555 07/29/19 0543  NA 134*  --  134* 134* 134*  K 4.6  --  4.9 4.6 4.9  CL 96*  --  104 99 96*  CO2 23  --   23 24 26   GLUCOSE 142*  --  190* 165* 214*  BUN 45*  --  34* 31* 33*  CREATININE 1.71* 1.54* 1.23 1.13 1.33*  CALCIUM 9.4  --  8.3* 9.0 9.0  MG  --   --  1.5* 2.0 1.8  PHOS  --   --  3.5 3.4 4.9*   Liver Function Tests: Recent Labs  Lab 07/27/19 0541 07/28/19 0555 07/29/19 0543  AST 47* 48* 50*  ALT 30 30 30   ALKPHOS 67 72 77  BILITOT 1.1 1.1 1.2  PROT 6.7 7.2 7.4  ALBUMIN 3.2* 3.6 3.5   CBC: Recent Labs  Lab 07/26/19 1148 07/26/19 1730  WBC 6.6 5.8  HGB 15.8 14.6  HCT 45.0 42.1  MCV 83.8 83.9  PLT 153 128*   Cardiac Enzymes: Recent Labs  Lab 07/26/19 1148  CKTOTAL 66    CBG: Recent Labs  Lab 07/28/19 0849 07/28/19 1259 07/28/19 1726 07/28/19 2122 07/29/19 0827  GLUCAP 145* 119* 146* 177* 176*    Recent Results (from the past 240 hour(s))  SARS Coronavirus 2 by RT PCR (hospital order, performed in [facility] hospital lab) Nasopharyngeal Nasopharyngeal Swab     Status: Abnormal   Collection Time: 07/21/19  6:23 PM   Specimen: Nasopharyngeal Swab  Result Value Ref Range Status   SARS Coronavirus  2 POSITIVE (A) NEGATIVE Final    Comment: RESULT CALLED TO, READ BACK BY AND VERIFIED WITH: ALEX CARRICO ON 07/21/19 AT 1913 QSD (NOTE) SARS-CoV-2 target nucleic acids are DETECTED  SARS-CoV-2 RNA is generally detectable in upper respiratory specimens  during the acute phase of infection.  Positive results are indicative  of the presence of the identified virus, but do not rule out bacterial infection or co-infection with other pathogens not detected by the test.  Clinical correlation with patient history and  other diagnostic information is necessary to determine patient infection status.  The expected result is negative.  Fact Sheet for Patients:   BoilerBrush.com.cy   Fact Sheet for Healthcare Providers:   https://pope.com/    This test is not yet approved or cleared by the [place] FDA and  has  been authorized for detection and/or diagnosis of SARS-CoV-2 by FDA under an Emergency Use Authorization (EUA).  This EUA will remain in effect (meaning this t est can be used) for the duration of  the COVID-19 declaration under Section 564(b)(1) of the Act, 21 U.S.C. section 360-bbb-3(b)(1), unless the authorization is terminated or revoked sooner.  Performed at [facility], [address] [place], [place] [address]      Scheduled Meds: . albuterol  2 puff Inhalation Q6H  . vitamin C  500 mg Oral Daily  . dexamethasone (DECADRON) injection  6 mg Intravenous Q24H  . enoxaparin (LOVENOX) injection  40 mg Subcutaneous Q24H  . famotidine  20 mg Oral Daily  . feeding supplement (ENSURE ENLIVE)  237 mL Oral BID BM  . feeding supplement (PRO-STAT SUGAR FREE 64)  30 mL Oral BID  . insulin aspart  0-15 Units Subcutaneous TID WC  . insulin glargine  15 Units Subcutaneous QHS  . levothyroxine  50 mcg Oral Q0600  . multivitamin with minerals  1 tablet Oral Daily  . zinc sulfate  220 mg Oral Daily   Continuous Infusions: . remdesivir 100 mg in NS 100 mL 100 mg (07/29/19 1038)    Assessment/Plan:  1. COVID-19 pneumonia.  Remdesivir day 4 today.  Continue Decadron, vitamin C and zinc. 2. Nausea with epigastric pain seems better today.  Started famotidine yesterday as needed Zofran.  Looking back at CT scan did show some thickening of the esophagus. 3. Type 2 diabetes mellitus on glargine insulin and sliding scale 4. Acute kidney injury.  Likely has an element of chronic kidney disease stage IIIa. 5. Hypothyroidism unspecified on levothyroxine 6. Hyponatremia this is improved 7. Right leg pain and difficulty walking we will get an ultrasound of the right lower extremity to rule out cyst 8. Weakness physical therapy evaluation    Code Status:     Code Status Orders  (From admission, onward)         Start     Ordered   07/26/19 1709  Full code  Continuous         07/26/19 1715        Code Status History    Date Active Date Inactive Code Status Order ID Comments User Context   01/04/2016 0755 01/05/2016 1309 Full Code [id number]  Arnaldo Natal, MD ED   Advance Care Planning Activity     Family Communication: Left message for daughter Disposition Plan: Status is: Inpatient  Dispo: The patient is from: Home              Anticipated d/c is to: Home  Anticipated d/c date is: Potentially 07/30/2019              Patient currently being treated for COVID-19 pneumonia with IV remdesivir and IV steroids.  Patient requires completion of course here in the hospital.  Antibiotics:  Remdesivir  Time spent: 27 minutes  Dayten [employer]

## 2019-07-30 ENCOUNTER — Inpatient Hospital Stay: Payer: Medicare HMO

## 2019-07-30 DIAGNOSIS — J9621 Acute and chronic respiratory failure with hypoxia: Secondary | ICD-10-CM

## 2019-07-30 DIAGNOSIS — J9601 Acute respiratory failure with hypoxia: Secondary | ICD-10-CM

## 2019-07-30 DIAGNOSIS — E1140 Type 2 diabetes mellitus with diabetic neuropathy, unspecified: Secondary | ICD-10-CM

## 2019-07-30 LAB — COMPREHENSIVE METABOLIC PANEL
ALT: 28 U/L (ref 0–44)
AST: 44 U/L — ABNORMAL HIGH (ref 15–41)
Albumin: 3.5 g/dL (ref 3.5–5.0)
Alkaline Phosphatase: 82 U/L (ref 38–126)
Anion gap: 12 (ref 5–15)
BUN: 37 mg/dL — ABNORMAL HIGH (ref 8–23)
CO2: 24 mmol/L (ref 22–32)
Calcium: 9 mg/dL (ref 8.9–10.3)
Chloride: 99 mmol/L (ref 98–111)
Creatinine, Ser: 1.17 mg/dL (ref 0.61–1.24)
GFR calc Af Amer: >60 mL/min (ref >60)
GFR calc non Af Amer: >60 mL/min (ref >60)
Glucose, Bld: 224 mg/dL — ABNORMAL HIGH (ref 70–99)
Potassium: 4.3 mmol/L (ref 3.5–5.1)
Sodium: 135 mmol/L (ref 135–145)
Total Bilirubin: 1.2 mg/dL (ref 0.3–1.2)
Total Protein: 7.3 g/dL (ref 6.5–8.1)

## 2019-07-30 LAB — PHOSPHORUS: Phosphorus: 4.7 mg/dL — ABNORMAL HIGH (ref 2.5–4.6)

## 2019-07-30 LAB — GLUCOSE, CAPILLARY
Glucose-Capillary: 178 mg/dL — ABNORMAL HIGH (ref 70–99)
Glucose-Capillary: 196 mg/dL — ABNORMAL HIGH (ref 70–99)
Glucose-Capillary: 145 mg/dL — ABNORMAL HIGH (ref 70–99)
Glucose-Capillary: 220 mg/dL — ABNORMAL HIGH (ref 70–99)

## 2019-07-30 LAB — MAGNESIUM: Magnesium: 1.8 mg/dL (ref 1.7–2.4)

## 2019-07-30 LAB — FIBRIN DERIVATIVES D-DIMER (ARMC ONLY): Fibrin derivatives D-dimer (ARMC): 354.57 ng/mL (FEU) (ref 0.00–499.00)

## 2019-07-30 LAB — C-REACTIVE PROTEIN: CRP: 3.4 mg/dL — ABNORMAL HIGH (ref <1.0)

## 2019-07-30 MED ORDER — INSULIN ASPART 100 UNIT/ML ~~LOC~~ SOLN
3.0000 [IU] | Freq: Three times a day (TID) | SUBCUTANEOUS | Status: DC
  Administered 2019-07-30 – 2019-08-01 (×6): 3 [IU] via SUBCUTANEOUS
  Filled 2019-07-30 (×6): qty 1

## 2019-07-30 MED ORDER — SODIUM CHLORIDE 0.9 % IV SOLN
2.0000 g | INTRAVENOUS | Status: DC
  Administered 2019-07-30 – 2019-08-01 (×3): 2 g via INTRAVENOUS
  Filled 2019-07-30 (×3): qty 20
  Filled 2019-07-30: qty 2

## 2019-07-30 MED ORDER — TOCILIZUMAB 400 MG/20ML IV SOLN
8.0000 mg/kg | Freq: Once | INTRAVENOUS | Status: AC
  Administered 2019-07-30: 762 mg via INTRAVENOUS
  Filled 2019-07-30: qty 38.1

## 2019-07-30 MED ORDER — AZITHROMYCIN 500 MG PO TABS
250.0000 mg | ORAL_TABLET | Freq: Every day | ORAL | Status: DC
  Administered 2019-07-31 – 2019-08-01 (×2): 250 mg via ORAL
  Filled 2019-07-30 (×2): qty 1

## 2019-07-30 MED ORDER — DILTIAZEM HCL ER COATED BEADS 120 MG PO CP24
120.0000 mg | ORAL_CAPSULE | Freq: Every day | ORAL | Status: DC
  Administered 2019-07-30: 120 mg via ORAL
  Filled 2019-07-30 (×2): qty 1

## 2019-07-30 MED ORDER — POLYETHYLENE GLYCOL 3350 17 G PO PACK
17.0000 g | PACK | Freq: Once | ORAL | Status: AC
  Administered 2019-07-30: 13:00:00 17 g via ORAL
  Filled 2019-07-30: qty 1

## 2019-07-30 MED ORDER — AZITHROMYCIN 500 MG PO TABS
500.0000 mg | ORAL_TABLET | Freq: Every day | ORAL | Status: AC
  Administered 2019-07-30: 13:00:00 500 mg via ORAL
  Filled 2019-07-30: qty 1

## 2019-07-30 MED ORDER — FUROSEMIDE 10 MG/ML IJ SOLN
20.0000 mg | Freq: Once | INTRAMUSCULAR | Status: AC
  Administered 2019-07-30: 18:00:00 20 mg via INTRAVENOUS
  Filled 2019-07-30: qty 2

## 2019-07-30 NOTE — Care Management Important Message (Signed)
Important Message  Patient Details  Name: ISAAH FURRY MRN: [id number] Date of Birth: 1955/02/24   Medicare Important Message Given:  Yes     Eilleen Kempf, LCSW 07/30/2019, 8:17 AM

## 2019-07-30 NOTE — Progress Notes (Signed)
Inpatient Diabetes Program Recommendations  AACE/ADA: New Consensus Statement on Inpatient Glycemic Control (2015)  Target Ranges:  Prepandial:   less than 140 mg/dL      Peak postprandial:   less than 180 mg/dL (1-2 hours)      Critically ill patients:  140 - 180 mg/dL   Lab Results  Component Value Date   GLUCAP 196 (H) 07/30/2019   HGBA1C 6.9 (H) 07/26/2019    Review of Glycemic Control Results for Nathan Wong, Nathan Wong (MRN [id number]) as of 07/30/2019 12:57  Ref. Range 07/29/2019 08:27 07/29/2019 16:34 07/29/2019 21:21 07/30/2019 07:41 07/30/2019 12:34  Glucose-Capillary Latest Ref Range: 70 - 99 mg/dL 317 (H) 409 (H) 927 (H) 178 (H) 196 (H)   Diabetes history: DM 2 Outpatient Diabetes medications:  Degludec Evaristo Bury) 20 units daily, Glucotrol 5 mg daily, Metformin 1000 mg bid Current orders for Inpatient glycemic control:  Decadron 6 mg daily Lantus 15 units daily Novolog moderate tid with meals Inpatient Diabetes Program Recommendations:   While on steroids, consider adding Novolog meal coverage 3 units tid with meals (hold if patient eats less than 50%).   Thanks,  Beryl Meager, RN, BC-ADM Inpatient Diabetes Coordinator Pager [phone] (8a-5p)

## 2019-07-30 NOTE — Evaluation (Signed)
Physical Therapy Evaluation Patient Details Name: LY BACCHI MRN: [id number] DOB: 1955-02-06 Today's Date: 07/30/2019   History of Present Illness  Pt is a 64 year old male with history of essential hypertension diabetes mellitus type 2 presented with worsening shortness of breath.  He was tested positive for COVID-19 on 07/21/2019.  On presentation, he was found to have acute kidney injury with creatinine of 1.7 and CT showed scattered groundglass opacities.    Clinical Impression  Pt alert, in bed, agreeable to PT denied pain. Pt reported at baseline he is independent, performs ADLs/IADLs without issues.   Session focused on maintaining spO2 with activity. Pt performed supine exercises without physical assist, did experience desaturation on 4.5 and 5L but recovered quickly with PLB and rest. Supine <> sit independently, normal sitting balance noted, pt on 6L but unable to maintain spO2 >90% (dropped to 84% despite seated rest and PLB), returned to supine and recovered quickly. The patient demonstrated deficits in activity tolerance and functional mobility due to desaturation. Pending pt progress recommendation is HHPT.     Follow Up Recommendations Home health PT    Equipment Recommendations  None recommended by PT    Recommendations for Other Services       Precautions / Restrictions Precautions Precautions: Fall Precaution Comments: watch O2 Restrictions Weight Bearing Restrictions: No      Mobility  Bed Mobility Overal bed mobility: Independent                Transfers Overall transfer level: Independent                  Ambulation/Gait                Stairs            Wheelchair Mobility    Modified Rankin (Stroke Patients Only)       Balance Overall balance assessment: Needs assistance Sitting-balance support: Feet supported Sitting balance-Leahy Scale: Normal                                       Pertinent  Vitals/Pain Pain Assessment: No/denies pain    Home Living Family/patient expects to be discharged to:: Private residence Living Arrangements: Children;Other relatives (grandchildren) Available Help at Discharge: Family Type of Home: House Home Access: Stairs to enter Entrance Stairs-Rails: Right;Left;Can reach both Entrance Stairs-Number of Steps: 4 Home Layout: Two level;Able to live on main level with bedroom/bathroom Home Equipment: None      Prior Function Level of Independence: Independent               Hand Dominance   Dominant Hand: Right    Extremity/Trunk Assessment   Upper Extremity Assessment Upper Extremity Assessment: Overall WFL for tasks assessed    Lower Extremity Assessment Lower Extremity Assessment: Overall WFL for tasks assessed    Cervical / Trunk Assessment Cervical / Trunk Assessment: Normal  Communication   Communication: No difficulties  Cognition Arousal/Alertness: Awake/alert Behavior During Therapy: WFL for tasks assessed/performed Overall Cognitive Status: Within Functional Limits for tasks assessed                                        General Comments      Exercises Other Exercises Other Exercises: Pt able to perform heel slides and SLR x10-15 reps  bilaterally did desaturate on 5Ls, cued for rest breaks and PLB Other Exercises: able to sit EOB modI, unable to maintain > 45seconds due to desaturation on 6L, returned to supine and recovered quickly   Assessment/Plan    PT Assessment Patient needs continued PT services  PT Problem List Decreased strength;Decreased mobility;Decreased activity tolerance       PT Treatment Interventions DME instruction;Balance training;Gait training;Neuromuscular re-education;Stair training;Functional mobility training;Therapeutic activities;Patient/family education;Therapeutic exercise    PT Goals (Current goals can be found in the Care Plan section)  Acute Rehab PT  Goals Patient Stated Goal: to go home PT Goal Formulation: With patient Time For Goal Achievement: 08/13/19 Potential to Achieve Goals: Good    Frequency Min 2X/week   Barriers to discharge        Co-evaluation               AM-PAC PT "6 Clicks" Mobility  Outcome Measure Help needed turning from your back to your side while in a flat bed without using bedrails?: None Help needed moving from lying on your back to sitting on the side of a flat bed without using bedrails?: None Help needed moving to and from a bed to a chair (including a wheelchair)?: None Help needed standing up from a chair using your arms (e.g., wheelchair or bedside chair)?: None Help needed to walk in hospital room?: None Help needed climbing 3-5 steps with a railing? : A Little 6 Click Score: 23    End of Session Equipment Utilized During Treatment: Oxygen (4.5-6L) Activity Tolerance: Treatment limited secondary to medical complications (Comment);Other (comment) (limited by desaturation) Patient left: in bed;with call bell/phone within reach;with nursing/sitter in room Nurse Communication: Mobility status PT Visit Diagnosis: Other abnormalities of gait and mobility (R26.89)    Time: 6629-4765 PT Time Calculation (min) (ACUTE ONLY): 32 min   Charges:   PT Evaluation $PT Eval Moderate Complexity: 1 Mod PT Treatments $Therapeutic Exercise: 8-22 mins        Olga Coaster PT, DPT 3:52 PM,07/30/19

## 2019-07-30 NOTE — Progress Notes (Signed)
Patient ID: Nathan Wong, male   DOB: 02-01-1956, 64 y.o.   MRN: [id number] [facility] PROGRESS NOTE  Nathan Wong SPQ:[id number] DOB: November 25, 1955 DOA: 07/26/2019 PCP: Corky Downs, MD  HPI/Subjective: Patient was placed on oxygen last night and this morning unable to come off the oxygen.  Patient feels okay.  Still has some cough.  Patient admitted with COVID-19 pneumonia and shortness of breath.  Objective: Vitals:   07/30/19 0700 07/30/19 1100  BP: 135/87 119/82  Pulse: 80 (!) 102  Resp: 18 19  Temp: 97.9 F (36.6 C) 97.7 F (36.5 C)  SpO2: 96% 92%    Intake/Output Summary (Last 24 hours) at 07/30/2019 1451 Last data filed at 07/29/2019 2124 Gross per 24 hour  Intake --  Output 500 ml  Net -500 ml   Filed Weights   07/26/19 1143  Weight: 95.3 kg    ROS: Review of Systems  Respiratory: Positive for cough. Negative for shortness of breath.   Cardiovascular: Negative for chest pain.  Gastrointestinal: Positive for constipation. Negative for abdominal pain.   Exam: Physical Exam  HENT:  Nose: No mucosal edema.  Mouth/Throat: No oropharyngeal exudate.  Eyes: Pupils are equal, round, and reactive to light. Lids are normal.  Cardiovascular: Normal rate, regular rhythm, S1 normal, S2 normal and normal heart sounds.  Respiratory: He has decreased breath sounds in the right lower field and the left lower field. He has no wheezes. He has no rhonchi. He has no rales.  GI: Soft. There is no abdominal tenderness.  Musculoskeletal:     Right ankle: No swelling.     Left ankle: No swelling.  Neurological: He is alert.  Skin: Skin is warm. No rash noted.      Data Reviewed: Basic Metabolic Panel: Recent Labs  Lab 07/26/19 1148 07/26/19 1148 07/26/19 1730 07/27/19 0541 07/28/19 0555 07/29/19 0543 07/30/19 0415  NA 134*  --   --  134* 134* 134* 135  K 4.6  --   --  4.9 4.6 4.9 4.3  CL 96*  --   --  104 99 96* 99  CO2 23  --   --  23 24 26 24   GLUCOSE 142*   --   --  190* 165* 214* 224*  BUN 45*  --   --  34* 31* 33* 37*  CREATININE 1.71*   < > 1.54* 1.23 1.13 1.33* 1.17  CALCIUM 9.4  --   --  8.3* 9.0 9.0 9.0  MG  --   --   --  1.5* 2.0 1.8 1.8  PHOS  --   --   --  3.5 3.4 4.9* 4.7*   < > = values in this interval not displayed.   Liver Function Tests: Recent Labs  Lab 07/27/19 0541 07/28/19 0555 07/29/19 0543 07/30/19 0415  AST 47* 48* 50* 44*  ALT 30 30 30 28   ALKPHOS 67 72 77 82  BILITOT 1.1 1.1 1.2 1.2  PROT 6.7 7.2 7.4 7.3  ALBUMIN 3.2* 3.6 3.5 3.5   CBC: Recent Labs  Lab 07/26/19 1148 07/26/19 1730  WBC 6.6 5.8  HGB 15.8 14.6  HCT 45.0 42.1  MCV 83.8 83.9  PLT 153 128*   Cardiac Enzymes: Recent Labs  Lab 07/26/19 1148  CKTOTAL 66   CBG: Recent Labs  Lab 07/29/19 0827 07/29/19 1634 07/29/19 2121 07/30/19 0741 07/30/19 1234  GLUCAP 176* 143* 219* 178* 196*    Recent Results (from the past 240 hour(s))  SARS Coronavirus 2 by RT PCR (hospital order, performed in [facility] hospital lab) Nasopharyngeal Nasopharyngeal Swab     Status: Abnormal   Collection Time: 07/21/19  6:23 PM   Specimen: Nasopharyngeal Swab  Result Value Ref Range Status   SARS Coronavirus 2 POSITIVE (A) NEGATIVE Final    Comment: RESULT CALLED TO, READ BACK BY AND VERIFIED WITH: ALEX CARRICO ON 07/21/19 AT 1913 QSD (NOTE) SARS-CoV-2 target nucleic acids are DETECTED  SARS-CoV-2 RNA is generally detectable in upper respiratory specimens  during the acute phase of infection.  Positive results are indicative  of the presence of the identified virus, but do not rule out bacterial infection or co-infection with other pathogens not detected by the test.  Clinical correlation with patient history and  other diagnostic information is necessary to determine patient infection status.  The expected result is negative.  Fact Sheet for Patients:   BoilerBrush.com.cy   Fact Sheet for Healthcare Providers:    https://pope.com/    This test is not yet approved or cleared by the [place] FDA and  has been authorized for detection and/or diagnosis of SARS-CoV-2 by FDA under an Emergency Use Authorization (EUA).  This EUA will remain in effect (meaning this t est can be used) for the duration of  the COVID-19 declaration under Section 564(b)(1) of the Act, 21 U.S.C. section 360-bbb-3(b)(1), unless the authorization is terminated or revoked sooner.  Performed at [facility], [address] [place], [place] [address]      Studies: US Venous Img Lower Bilateral (DVT)  Result Date: 07/29/2019 CLINICAL DATA:  64 year old male with bilateral lower extremity pain EXAM: BILATERAL LOWER EXTREMITY VENOUS DOPPLER ULTRASOUND TECHNIQUE: Gray-scale sonography with graded compression, as well as color Doppler and duplex ultrasound were performed to evaluate the lower extremity deep venous systems from the level of the common femoral vein and including the common femoral, femoral, profunda femoral, popliteal and calf veins including the posterior tibial, peroneal and gastrocnemius veins when visible. The superficial great saphenous vein was also interrogated. Spectral Doppler was utilized to evaluate flow at rest and with distal augmentation maneuvers in the common femoral, femoral and popliteal veins. COMPARISON:  None. FINDINGS: RIGHT LOWER EXTREMITY Common Femoral Vein: No evidence of thrombus. Normal compressibility, respiratory phasicity and response to augmentation. Saphenofemoral Junction: No evidence of thrombus. Normal compressibility and flow on color Doppler imaging. Profunda Femoral Vein: No evidence of thrombus. Normal compressibility and flow on color Doppler imaging. Femoral Vein: No evidence of thrombus. Normal compressibility, respiratory phasicity and response to augmentation. Popliteal Vein: No evidence of thrombus. Normal compressibility, respiratory phasicity  and response to augmentation. Calf Veins: No evidence of thrombus. Normal compressibility and flow on color Doppler imaging. Superficial Great Saphenous Vein: No evidence of thrombus. Normal compressibility and flow on color Doppler imaging. Other Findings:  None. LEFT LOWER EXTREMITY Common Femoral Vein: No evidence of thrombus. Normal compressibility, respiratory phasicity and response to augmentation. Saphenofemoral Junction: No evidence of thrombus. Normal compressibility and flow on color Doppler imaging. Profunda Femoral Vein: No evidence of thrombus. Normal compressibility and flow on color Doppler imaging. Femoral Vein: No evidence of thrombus. Normal compressibility, respiratory phasicity and response to augmentation. Popliteal Vein: No evidence of thrombus. Normal compressibility, respiratory phasicity and response to augmentation. Calf Veins: No evidence of thrombus. Normal compressibility and flow on color Doppler imaging. Superficial Great Saphenous Vein: No evidence of thrombus. Normal compressibility and flow on color Doppler imaging. Other Findings:  None. IMPRESSION: Sonographic survey of  the bilateral lower extremities negative for DVT Electronically Signed   By: Gilmer Mor D.O.   On: 07/29/2019 15:08   DG Chest Port 1 View  Result Date: 07/30/2019 CLINICAL DATA:  64 year old male with history of COVID-19 infection. Weakness. EXAM: PORTABLE CHEST 1 VIEW COMPARISON:  Chest x-ray 07/26/2019. FINDINGS: Patchy ill-defined opacities and areas of interstitial prominence noted throughout the lungs bilaterally, most evident throughout the mid to lower lungs. No pleural effusions. No evidence of pulmonary edema. Heart size is normal. Upper mediastinal contours are within normal limits. IMPRESSION: 1. The appearance the chest is concerning for multilobar bilateral pneumonia, compatible with reported COVID-19 infection. Electronically Signed   By: Trudie Reed M.D.   On: 07/30/2019 09:46     Scheduled Meds: . albuterol  2 puff Inhalation Q6H  . vitamin C  500 mg Oral Daily  . [START ON 07/31/2019] azithromycin  250 mg Oral Daily  . dexamethasone (DECADRON) injection  6 mg Intravenous Q24H  . diltiazem  120 mg Oral Daily  . enoxaparin (LOVENOX) injection  40 mg Subcutaneous Q24H  . famotidine  20 mg Oral Daily  . feeding supplement (ENSURE ENLIVE)  237 mL Oral BID BM  . feeding supplement (PRO-STAT SUGAR FREE 64)  30 mL Oral BID  . insulin aspart  0-15 Units Subcutaneous TID WC  . insulin glargine  15 Units Subcutaneous QHS  . levothyroxine  50 mcg Oral Q0600  . multivitamin with minerals  1 tablet Oral Daily  . zinc sulfate  220 mg Oral Daily   Continuous Infusions: . cefTRIAXone (ROCEPHIN)  IV 2 g (07/30/19 1250)  . tocilizumab (ACTEMRA) - non-COVID treatment 762 mg (07/30/19 1435)    Assessment/Plan:  1. Acute hypoxic respiratory failure.  The patient did not require oxygen during the entire hospital stay until last night.  Today unable to come off the oxygen.  Continue oxygen supplementation for pulse ox less than 88%.  Last documented on 3 L of oxygen.  Repeat chest x-ray showing bilateral pneumonia.  I added antibiotics just in case bacterial infection. 2. COVID-19 pneumonia.  Remdesivir day 5 today.  Continue Decadron, vitamin C and zinc.  Since the patient now has an acute oxygen requirement I will give Actemra also. 3. Type 2 diabetes mellitus on glargine insulin and sliding scale.  With peripheral neuropathy. 4. Hypothyroidism unspecified on levothyroxine 5. Acute kidney injury on chronic kidney disease stage II 6. Right leg pain and difficulty walking likely diabetic neuropathy.  Ultrasound the lower extremity negative. 7. Weakness needs PT evaluation      Code Status:     Code Status Orders  (From admission, onward)         Start     Ordered   07/26/19 1709  Full code  Continuous        07/26/19 1715        Code Status History    Date  Active Date Inactive Code Status Order ID Comments User Context   01/04/2016 0755 01/05/2016 1309 Full Code [id number]  Arnaldo Natal, MD ED   Advance Care Planning Activity     Family Communication: Spoke with the patient's daughter on the phone Disposition Plan: Status is: Inpatient  Dispo: The patient is from: Home              Anticipated d/c is to: Home              Anticipated d/c date is: Will reevaluate on a daily basis  on when to go home.  Potentially 6/26 versus 6/27.              Patient currently has a new oxygen requirement with COVID-19 pneumonia.  Giving Actemra today.  Since the patient did not require oxygen during the entire hospital course up until last night I will give a chance to try to get off the oxygen while here in the hospital.  Reevaluate on a daily basis.  Antibiotics:  Rocephin  Zithromax  Time spent: 28 minutes  [address]

## 2019-07-31 ENCOUNTER — Inpatient Hospital Stay: Payer: Medicare HMO

## 2019-07-31 DIAGNOSIS — R000 Tachycardia, unspecified: Secondary | ICD-10-CM

## 2019-07-31 LAB — C-REACTIVE PROTEIN: CRP: 2.9 mg/dL — ABNORMAL HIGH (ref <1.0)

## 2019-07-31 LAB — GLUCOSE, CAPILLARY
Glucose-Capillary: 228 mg/dL — ABNORMAL HIGH (ref 70–99)
Glucose-Capillary: 211 mg/dL — ABNORMAL HIGH (ref 70–99)
Glucose-Capillary: 208 mg/dL — ABNORMAL HIGH (ref 70–99)
Glucose-Capillary: 126 mg/dL — ABNORMAL HIGH (ref 70–99)

## 2019-07-31 LAB — PHOSPHORUS: Phosphorus: 4.9 mg/dL — ABNORMAL HIGH (ref 2.5–4.6)

## 2019-07-31 LAB — COMPREHENSIVE METABOLIC PANEL
ALT: 28 U/L (ref 0–44)
AST: 41 U/L (ref 15–41)
Albumin: 3.7 g/dL (ref 3.5–5.0)
Alkaline Phosphatase: 85 U/L (ref 38–126)
Anion gap: 14 (ref 5–15)
BUN: 42 mg/dL — ABNORMAL HIGH (ref 8–23)
CO2: 26 mmol/L (ref 22–32)
Calcium: 9.4 mg/dL (ref 8.9–10.3)
Chloride: 96 mmol/L — ABNORMAL LOW (ref 98–111)
Creatinine, Ser: 1.29 mg/dL — ABNORMAL HIGH (ref 0.61–1.24)
GFR calc Af Amer: >60 mL/min (ref >60)
GFR calc non Af Amer: 58 mL/min — ABNORMAL LOW (ref >60)
Glucose, Bld: 265 mg/dL — ABNORMAL HIGH (ref 70–99)
Potassium: 4.1 mmol/L (ref 3.5–5.1)
Sodium: 136 mmol/L (ref 135–145)
Total Bilirubin: 1.4 mg/dL — ABNORMAL HIGH (ref 0.3–1.2)
Total Protein: 7.6 g/dL (ref 6.5–8.1)

## 2019-07-31 LAB — FIBRIN DERIVATIVES D-DIMER (ARMC ONLY): Fibrin derivatives D-dimer (ARMC): 305.55 ng/mL (FEU) (ref 0.00–499.00)

## 2019-07-31 LAB — MAGNESIUM: Magnesium: 1.7 mg/dL (ref 1.7–2.4)

## 2019-07-31 MED ORDER — DILTIAZEM HCL ER COATED BEADS 180 MG PO CP24
180.0000 mg | ORAL_CAPSULE | Freq: Every day | ORAL | Status: DC
  Administered 2019-07-31 – 2019-08-01 (×2): 180 mg via ORAL
  Filled 2019-07-31 (×2): qty 1

## 2019-07-31 MED ORDER — FUROSEMIDE 10 MG/ML IJ SOLN
20.0000 mg | Freq: Once | INTRAMUSCULAR | Status: AC
  Administered 2019-07-31: 20 mg via INTRAVENOUS
  Filled 2019-07-31: qty 2

## 2019-07-31 MED ORDER — IOHEXOL 350 MG/ML SOLN
75.0000 mL | Freq: Once | INTRAVENOUS | Status: AC | PRN
  Administered 2019-07-31: 11:00:00 75 mL via INTRAVENOUS

## 2019-07-31 NOTE — Progress Notes (Signed)
Patient ID: Nathan Wong, male   DOB: 18-Feb-1955, 64 y.o.   MRN: [id number] [facility] PROGRESS NOTE  Nathan Wong FGH:[id number] DOB: 10/13/1955 DOA: 07/26/2019 PCP: Corky Downs, MD  HPI/Subjective: Patient was admitted with COVID-19 pneumonia and shortness of breath.  Patient was on 6 L of oxygen when I saw him this morning.  I took him off the oxygen and he held the sats in the upper 80s and low 90s.  When I stood him up he dropped his saturations into the upper 70s and 80s but it was not the greatest wave on the pulse ox.  Patient felt dizzy and needed to sit down.  He was placed back on 3.5 L  Objective: Vitals:   07/30/19 2203 07/31/19 0904  BP: 119/74   Pulse: 87   Resp:    Temp: 98.5 F (36.9 C) 97.6 F (36.4 C)  SpO2:      Intake/Output Summary (Last 24 hours) at 07/31/2019 1316 Last data filed at 07/31/2019 0935 Gross per 24 hour  Intake --  Output 1600 ml  Net -1600 ml   Filed Weights   07/26/19 1143  Weight: 95.3 kg    ROS: Review of Systems  Respiratory: Positive for cough and shortness of breath.   Cardiovascular: Negative for chest pain.  Gastrointestinal: Negative for abdominal pain.  Neurological: Positive for dizziness.   Exam: Physical Exam HENT:     Head: Normocephalic.     Nose: No mucosal edema.     Mouth/Throat:     Pharynx: No oropharyngeal exudate.  Eyes:     General: Lids are normal.     Pupils: Pupils are equal, round, and reactive to light.  Cardiovascular:     Rate and Rhythm: Regular rhythm. Tachycardia present.     Heart sounds: Normal heart sounds, S1 normal and S2 normal.  Pulmonary:     Breath sounds: Examination of the right-lower field reveals decreased breath sounds. Examination of the left-lower field reveals decreased breath sounds. Decreased breath sounds present. No wheezing, rhonchi or rales.  Abdominal:     Palpations: Abdomen is soft.     Tenderness: There is no abdominal tenderness.  Musculoskeletal:      Right lower leg: No swelling.     Left lower leg: No swelling.  Skin:    General: Skin is warm.     Findings: No rash.  Neurological:     Mental Status: He is alert.  Psychiatric:        Attention and Perception: Attention normal.        Mood and Affect: Mood normal.       Data Reviewed: Basic Metabolic Panel: Recent Labs  Lab 07/27/19 0541 07/28/19 0555 07/29/19 0543 07/30/19 0415 07/31/19 1032  NA 134* 134* 134* 135 136  K 4.9 4.6 4.9 4.3 4.1  CL 104 99 96* 99 96*  CO2 23 24 26 24 26   GLUCOSE 190* 165* 214* 224* 265*  BUN 34* 31* 33* 37* 42*  CREATININE 1.23 1.13 1.33* 1.17 1.29*  CALCIUM 8.3* 9.0 9.0 9.0 9.4  MG 1.5* 2.0 1.8 1.8 1.7  PHOS 3.5 3.4 4.9* 4.7* 4.9*   Liver Function Tests: Recent Labs  Lab 07/27/19 0541 07/28/19 0555 07/29/19 0543 07/30/19 0415 07/31/19 1032  AST 47* 48* 50* 44* 41  ALT 30 30 30 28 28   ALKPHOS 67 72 77 82 85  BILITOT 1.1 1.1 1.2 1.2 1.4*  PROT 6.7 7.2 7.4 7.3 7.6  ALBUMIN 3.2* 3.6  3.5 3.5 3.7   CBC: Recent Labs  Lab 07/26/19 1148 07/26/19 1730  WBC 6.6 5.8  HGB 15.8 14.6  HCT 45.0 42.1  MCV 83.8 83.9  PLT 153 128*   Cardiac Enzymes: Recent Labs  Lab 07/26/19 1148  CKTOTAL 66    CBG: Recent Labs  Lab 07/30/19 0741 07/30/19 1234 07/30/19 1734 07/30/19 2142 07/31/19 0901  GLUCAP 178* 196* 145* 220* 228*    Recent Results (from the past 240 hour(s))  SARS Coronavirus 2 by RT PCR (hospital order, performed in [facility] hospital lab) Nasopharyngeal Nasopharyngeal Swab     Status: Abnormal   Collection Time: 07/21/19  6:23 PM   Specimen: Nasopharyngeal Swab  Result Value Ref Range Status   SARS Coronavirus 2 POSITIVE (A) NEGATIVE Final    Comment: RESULT CALLED TO, READ BACK BY AND VERIFIED WITH: ALEX CARRICO ON 07/21/19 AT 1913 QSD (NOTE) SARS-CoV-2 target nucleic acids are DETECTED  SARS-CoV-2 RNA is generally detectable in upper respiratory specimens  during the acute phase of infection.  Positive  results are indicative  of the presence of the identified virus, but do not rule out bacterial infection or co-infection with other pathogens not detected by the test.  Clinical correlation with patient history and  other diagnostic information is necessary to determine patient infection status.  The expected result is negative.  Fact Sheet for Patients:   BoilerBrush.com.cy   Fact Sheet for Healthcare Providers:   https://pope.com/    This test is not yet approved or cleared by the [place] FDA and  has been authorized for detection and/or diagnosis of SARS-CoV-2 by FDA under an Emergency Use Authorization (EUA).  This EUA will remain in effect (meaning this t est can be used) for the duration of  the COVID-19 declaration under Section 564(b)(1) of the Act, 21 U.S.C. section 360-bbb-3(b)(1), unless the authorization is terminated or revoked sooner.  Performed at [facility], [address] [place], [place] [address]      Studies: CT ANGIO CHEST PE W OR WO CONTRAST  Result Date: 07/31/2019 CLINICAL DATA:  Shortness of breath.  COVID-19 positive patient. EXAM: CT ANGIOGRAPHY CHEST WITH CONTRAST TECHNIQUE: Multidetector CT imaging of the chest was performed using the standard protocol during bolus administration of intravenous contrast. Multiplanar CT image reconstructions and MIPs were obtained to evaluate the vascular anatomy. CONTRAST:  5mL OMNIPAQUE IOHEXOL 350 MG/ML SOLN COMPARISON:  Chest x-ray July 30, 2019. CT scan of the chest July 26, 2019. FINDINGS: Cardiovascular: Three-vessel coronary artery calcifications. The heart is unchanged. The thoracic aorta remains nonaneurysmal with atherosclerotic change in no dissection. No pulmonary emboli are identified. Mediastinum/Nodes: The esophagus remains thick walled and patulous, similar in the interval. No adenopathy. No effusions. The thyroid is normal. Lungs/Pleura:  Central airways are normal. No pneumothorax. Worsening bilateral pulmonary infiltrates consistent with the patient's known COVID-19 pneumonia. Upper Abdomen: No acute abnormality. Musculoskeletal: No chest wall abnormality. No acute or significant osseous findings. Review of the MIP images confirms the above findings. IMPRESSION: 1. Worsening multifocal COVID-19 pneumonia. 2. No pulmonary emboli. 3. The esophagus remains thick walled and patulous. The etiology is unclear. When the patient is able, recommend endoscopy. Aortic Atherosclerosis (ICD10-I70.0). Electronically Signed   By: Gerome Sam III M.D   On: 07/31/2019 11:47   US Venous Img Lower Bilateral (DVT)  Result Date: 07/29/2019 CLINICAL DATA:  64 year old male with bilateral lower extremity pain EXAM: BILATERAL LOWER EXTREMITY VENOUS DOPPLER ULTRASOUND TECHNIQUE: Gray-scale sonography with graded compression,  as well as color Doppler and duplex ultrasound were performed to evaluate the lower extremity deep venous systems from the level of the common femoral vein and including the common femoral, femoral, profunda femoral, popliteal and calf veins including the posterior tibial, peroneal and gastrocnemius veins when visible. The superficial great saphenous vein was also interrogated. Spectral Doppler was utilized to evaluate flow at rest and with distal augmentation maneuvers in the common femoral, femoral and popliteal veins. COMPARISON:  None. FINDINGS: RIGHT LOWER EXTREMITY Common Femoral Vein: No evidence of thrombus. Normal compressibility, respiratory phasicity and response to augmentation. Saphenofemoral Junction: No evidence of thrombus. Normal compressibility and flow on color Doppler imaging. Profunda Femoral Vein: No evidence of thrombus. Normal compressibility and flow on color Doppler imaging. Femoral Vein: No evidence of thrombus. Normal compressibility, respiratory phasicity and response to augmentation. Popliteal Vein: No evidence of  thrombus. Normal compressibility, respiratory phasicity and response to augmentation. Calf Veins: No evidence of thrombus. Normal compressibility and flow on color Doppler imaging. Superficial Great Saphenous Vein: No evidence of thrombus. Normal compressibility and flow on color Doppler imaging. Other Findings:  None. LEFT LOWER EXTREMITY Common Femoral Vein: No evidence of thrombus. Normal compressibility, respiratory phasicity and response to augmentation. Saphenofemoral Junction: No evidence of thrombus. Normal compressibility and flow on color Doppler imaging. Profunda Femoral Vein: No evidence of thrombus. Normal compressibility and flow on color Doppler imaging. Femoral Vein: No evidence of thrombus. Normal compressibility, respiratory phasicity and response to augmentation. Popliteal Vein: No evidence of thrombus. Normal compressibility, respiratory phasicity and response to augmentation. Calf Veins: No evidence of thrombus. Normal compressibility and flow on color Doppler imaging. Superficial Great Saphenous Vein: No evidence of thrombus. Normal compressibility and flow on color Doppler imaging. Other Findings:  None. IMPRESSION: Sonographic survey of the bilateral lower extremities negative for DVT Electronically Signed   By: Corrie Mckusick D.O.   On: 07/29/2019 15:08   DG Chest Port 1 View  Result Date: 07/30/2019 CLINICAL DATA:  64 year old male with history of COVID-19 infection. Weakness. EXAM: PORTABLE CHEST 1 VIEW COMPARISON:  Chest x-ray 07/26/2019. FINDINGS: Patchy ill-defined opacities and areas of interstitial prominence noted throughout the lungs bilaterally, most evident throughout the mid to lower lungs. No pleural effusions. No evidence of pulmonary edema. Heart size is normal. Upper mediastinal contours are within normal limits. IMPRESSION: 1. The appearance the chest is concerning for multilobar bilateral pneumonia, compatible with reported COVID-19 infection. Electronically Signed   By:  Vinnie Langton M.D.   On: 07/30/2019 09:46    Scheduled Meds: . albuterol  2 puff Inhalation Q6H  . vitamin C  500 mg Oral Daily  . azithromycin  250 mg Oral Daily  . dexamethasone (DECADRON) injection  6 mg Intravenous Q24H  . diltiazem  180 mg Oral Daily  . enoxaparin (LOVENOX) injection  40 mg Subcutaneous Q24H  . famotidine  20 mg Oral Daily  . feeding supplement (ENSURE ENLIVE)  237 mL Oral BID BM  . feeding supplement (PRO-STAT SUGAR FREE 64)  30 mL Oral BID  . furosemide  20 mg Intravenous Once  . insulin aspart  0-15 Units Subcutaneous TID WC  . insulin aspart  3 Units Subcutaneous TID WC  . insulin glargine  15 Units Subcutaneous QHS  . levothyroxine  50 mcg Oral Q0600  . multivitamin with minerals  1 tablet Oral Daily  . zinc sulfate  220 mg Oral Daily   Continuous Infusions: . cefTRIAXone (ROCEPHIN)  IV 2 g (07/30/19 1250)  Assessment/Plan:  1. Acute hypoxic respiratory failure.  The patient did not require oxygen during the entire hospital stay until Thursday night.  Unable to take him off oxygen today with standing he dropped his pulse ox down into the 80s and possibly the 70s.  Placed back on 3.5 L.  CT scan of the chest showing no pulmonary embolism but worsening COVID-19 pneumonia.  Case discussed with pulmonary in consultation. 2. COVID-19 pneumonia.  Finished remdesivir 5-day course.  Given Actemra.  Continue Decadron, vitamin C and zinc.  Patient has not received the COVID-19 vaccination.  We will give another dose of Lasix today. 3. Type 2 diabetes mellitus on glargine insulin and sliding scale.  Patient also has peripheral neuropathy 4. Tachycardia with standing up.  Did prescribe Cardizem CD. 5. Acute kidney injury on chronic kidney disease stage II. 6. Weakness.  Continued physical therapy evaluation 7. Hypothyroidism unspecified on levothyroxine  Code Status:     Code Status Orders  (From admission, onward)         Start     Ordered   07/26/19  1709  Full code  Continuous        07/26/19 1715        Code Status History    Date Active Date Inactive Code Status Order ID Comments User Context   01/04/2016 0755 01/05/2016 1309 Full Code [id number]  Arnaldo Natal, MD ED   Advance Care Planning Activity     Family Communication: Spoke with the patient's daughter on the phone Disposition Plan: Status is: Inpatient  Dispo: The patient is from: Home              Anticipated d/c is to: Home              Anticipated d/c date is: To be determined based on his clinical status.  Became hypoxic, tachycardic and dizzy with standing today.  Likely will need a few more days here in the hospital              Patient currently being treated for COVID-19 pneumonia with worsening hypoxia.  Patient finished remdesivir and given Actemra.  Continue oxygen supplementation.  Consultants:  Case discussed with pulmonologist to see in consultation  Antibiotics:  Rocephin and Zithromax  Time spent: 28 minutes  Cleora [employer]

## 2019-07-31 NOTE — Progress Notes (Signed)
Removed O2 at 1800. At 1815 had to reinstate because O2 sats dropped to the 70's when patient got out the bed and ambulated to the restroom. Pt reported feeling SOB.  Placed on 3 liters and Sat increased to 86-87%. Encouraged I & S at this time with education. Increase Oxygen to 5 liters and O2 sat increased to 92%. Will continue to monitor.

## 2019-08-01 DIAGNOSIS — I951 Orthostatic hypotension: Secondary | ICD-10-CM

## 2019-08-01 DIAGNOSIS — J9621 Acute and chronic respiratory failure with hypoxia: Secondary | ICD-10-CM

## 2019-08-01 LAB — COMPREHENSIVE METABOLIC PANEL
ALT: 29 U/L (ref 0–44)
AST: 44 U/L — ABNORMAL HIGH (ref 15–41)
Albumin: 3.6 g/dL (ref 3.5–5.0)
Alkaline Phosphatase: 77 U/L (ref 38–126)
Anion gap: 11 (ref 5–15)
BUN: 46 mg/dL — ABNORMAL HIGH (ref 8–23)
CO2: 28 mmol/L (ref 22–32)
Calcium: 9.1 mg/dL (ref 8.9–10.3)
Chloride: 96 mmol/L — ABNORMAL LOW (ref 98–111)
Creatinine, Ser: 1.27 mg/dL — ABNORMAL HIGH (ref 0.61–1.24)
GFR calc Af Amer: >60 mL/min (ref >60)
GFR calc non Af Amer: 59 mL/min — ABNORMAL LOW (ref >60)
Glucose, Bld: 221 mg/dL — ABNORMAL HIGH (ref 70–99)
Potassium: 4.1 mmol/L (ref 3.5–5.1)
Sodium: 135 mmol/L (ref 135–145)
Total Bilirubin: 1.3 mg/dL — ABNORMAL HIGH (ref 0.3–1.2)
Total Protein: 7.3 g/dL (ref 6.5–8.1)

## 2019-08-01 LAB — GLUCOSE, CAPILLARY
Glucose-Capillary: 203 mg/dL — ABNORMAL HIGH (ref 70–99)
Glucose-Capillary: 209 mg/dL — ABNORMAL HIGH (ref 70–99)

## 2019-08-01 LAB — PROCALCITONIN: Procalcitonin: <0.1 ng/mL

## 2019-08-01 LAB — CBC
HCT: 43.5 % (ref 39.0–52.0)
Hemoglobin: 15.2 g/dL (ref 13.0–17.0)
MCH: 29.2 pg (ref 26.0–34.0)
MCHC: 34.9 g/dL (ref 30.0–36.0)
MCV: 83.5 fL (ref 80.0–100.0)
Platelets: 224 10*3/uL (ref 150–400)
RBC: 5.21 MIL/uL (ref 4.22–5.81)
RDW: 13.5 % (ref 11.5–15.5)
WBC: 6.7 10*3/uL (ref 4.0–10.5)
nRBC: 0 % (ref 0.0–0.2)

## 2019-08-01 LAB — FERRITIN: Ferritin: 1386 ng/mL — ABNORMAL HIGH (ref 24–336)

## 2019-08-01 MED ORDER — DILTIAZEM HCL ER COATED BEADS 180 MG PO CP24
180.0000 mg | ORAL_CAPSULE | Freq: Every day | ORAL | 0 refills | Status: DC
Start: 2019-08-01 — End: 2022-11-08

## 2019-08-01 MED ORDER — AZITHROMYCIN 250 MG PO TABS: ORAL_TABLET | ORAL | 0 refills | Status: DC

## 2019-08-01 MED ORDER — DEXAMETHASONE 2 MG PO TABS
ORAL_TABLET | ORAL | 0 refills | Status: DC
Start: 2019-08-01 — End: 2020-08-02

## 2019-08-01 MED ORDER — ASCORBIC ACID 500 MG PO TABS: 500.0000 mg | ORAL_TABLET | Freq: Every day | ORAL | 0 refills | Status: DC

## 2019-08-01 MED ORDER — FAMOTIDINE 20 MG PO TABS: 20.0000 mg | ORAL_TABLET | Freq: Every day | ORAL | 0 refills | Status: DC

## 2019-08-01 MED ORDER — SODIUM CHLORIDE 0.9 % IV BOLUS
250.0000 mL | Freq: Once | INTRAVENOUS | Status: AC
  Administered 2019-08-01: 12:00:00 250 mL via INTRAVENOUS

## 2019-08-01 MED ORDER — ZINC SULFATE 220 (50 ZN) MG PO CAPS: 220.0000 mg | ORAL_CAPSULE | Freq: Every day | ORAL | 0 refills | Status: DC

## 2019-08-01 MED ORDER — ENSURE ENLIVE PO LIQD: 237.0000 mL | Freq: Two times a day (BID) | ORAL | 0 refills | Status: DC

## 2019-08-01 MED ORDER — INSULIN DEGLUDEC 200 UNIT/ML ~~LOC~~ SOPN: 20.0000 [IU] | PEN_INJECTOR | Freq: Every day | SUBCUTANEOUS | 4 refills | Status: DC

## 2019-08-01 MED ORDER — CEFDINIR 300 MG PO CAPS
300.0000 mg | ORAL_CAPSULE | Freq: Two times a day (BID) | ORAL | 0 refills | Status: DC
Start: 2019-08-02 — End: 2020-08-02

## 2019-08-01 NOTE — Progress Notes (Signed)
removed 02 sats remained 89-90% on RA. Ambulated pt sats dropped to 84% HR increased to 130's. Pt requested to sit back down. sats dropped to 80%. After 3 minutes of rest sats only recoverd to 86%.  Applied 2 L O2 [place] sats increased to 89-90%. Will continue to monitor. Pt denies feeling shob.

## 2019-08-01 NOTE — Discharge Summary (Signed)
[facility] NAME: Nathan Wong    MR#:  [id number]  DATE OF BIRTH:  11-19-1955  DATE OF ADMISSION:  07/26/2019 ADMITTING PHYSICIAN: Sharen Hones, MD  DATE OF DISCHARGE: 08/01/2019  PRIMARY CARE PHYSICIAN: Cletis Athens, MD    ADMISSION DIAGNOSIS:  Generalized weakness [R53.1] AKI (acute kidney injury) ([place]) [N17.9] Type 2 diabetes mellitus without complication, with long-term current use of insulin (HCC) [E11.9, Z79.4] COVID-19 virus infection [U07.1] Pneumonia due to COVID-19 virus [U07.1, J12.82] COVID-19 [U07.1]  DISCHARGE DIAGNOSIS:  Active Problems:   Type 2 diabetes mellitus with diabetic neuropathy, unspecified (HCC)   Essential hypertension   Hypothyroidism   Tachycardia   Pneumonia due to COVID-19 virus   AKI (acute kidney injury) ([place])   Nausea   Epigastric pain   Type 2 diabetes mellitus without complication, with long-term current use of insulin (HCC)   Hyponatremia   Right leg pain   Generalized weakness   Acute respiratory failure with hypoxia ([place])   SECONDARY DIAGNOSIS:   Past Medical History:  Diagnosis Date  . Arthritis   . Diabetes mellitus without complication ([place])   . H/O hernia repair   . Hypertension     HOSPITAL COURSE:   1.  Acute and now chronic hypoxic respiratory failure.  The patient did not require oxygen until Thursday night.  We have been unable to get him off the oxygen at this point and now qualifies for home oxygen.  He is down to 2 L at rest and 4 L with ambulation.  Pulse ox does drop into the 80s with standing up and moving around.  Therefore the patient needs higher oxygen levels with ambulation.  Patient seen in consultation by Dr. Mortimer Fries pulmonary.  Patient will have to follow-up with Dr. Mortimer Fries as outpatient in 1 month to see whether or not he can come off the oxygen. 2.  COVID-19 pneumonia.  The patient finished remdesivir 5-day course.  Also given Actemra.  Patient given  Decadron here and will give 3 more days of Decadron upon going home.  Continue vitamin C and zinc for 1 month.  I recommend getting the COVID-19 vaccination in 3 months.  Rocephin and Zithromax were started after he required oxygen.  Will finish up 2 more days of Zithromax and Omnicef. 3.  Orthostatic hypotension.  I did give 2 days dose of Lasix yesterday and the day before.  I ended up giving a fluid bolus today.  The patient is slightly orthostatic and heart rate does go up when he stands.  The patient did not want to stay in the hospital any further.  I have been giving Cardizem CD and I will continue to give that upon disposition which should help out with heart rate.  TED hose prescribed. 4.  Type 2 diabetes mellitus on glargine insulin and sliding scale insulin while here.  Patient can go back on Tresiba insulin and his Glucophage at home.  Patient also has diabetic peripheral neuropathy on Neurontin. 5.  Tachycardia with standing up.  Continue Cardizem CD. 6.  Acute kidney injury on chronic kidney disease stage II. 7.  Weakness.  Physical therapy recommended home with home health. 8.  Hypothyroidism unspecified on levothyroxine.  DISCHARGE CONDITIONS:   Satisfactory  CONSULTS OBTAINED:  Pulmonary  DRUG ALLERGIES:  No Known Allergies  DISCHARGE MEDICATIONS:   Allergies as of 08/01/2019   No Known Allergies     Medication List    STOP taking these  medications   glipiZIDE 5 MG tablet Commonly known as: GLUCOTROL   lisinopril 10 MG tablet Commonly known as: ZESTRIL   predniSONE 20 MG tablet Commonly known as: DELTASONE     TAKE these medications   acetaminophen 500 MG tablet Commonly known as: TYLENOL Take 500 mg by mouth every 6 (six) hours as needed for mild pain.   albuterol 108 (90 Base) MCG/ACT inhaler Commonly known as: VENTOLIN HFA Inhale 2 puffs into the lungs every 6 (six) hours as needed.   ascorbic acid 500 MG tablet Commonly known as: VITAMIN C Take 1  tablet (500 mg total) by mouth daily. Start taking on: August 02, 2019   aspirin EC 81 MG tablet Take 81 mg by mouth daily.   azithromycin 250 MG tablet Commonly known as: ZITHROMAX One tab po daily for two more days Start taking on: August 02, 2019   benzonatate 100 MG capsule Commonly known as: Best boy Take 1-2 tabs TID prn cough   blood glucose meter kit and supplies Kit Dispense based on patient and insurance preference. Use up to four times daily as directed. (FOR ICD-9 250.00, 250.01).   Blood Pressure Kit 1 kit by Does not apply route daily.   cefdinir 300 MG capsule Commonly known as: OMNICEF Take 1 capsule (300 mg total) by mouth 2 (two) times daily. Start taking on: August 02, 2019   cholecalciferol 25 MCG (1000 UNIT) tablet Commonly known as: VITAMIN D3 Take 1,000 Units by mouth daily.   dexamethasone 2 MG tablet Commonly known as: DECADRON 3 tabs po daily for three more days then stop   diltiazem 180 MG 24 hr capsule Commonly known as: Cardizem CD Take 1 capsule (180 mg total) by mouth daily.   famotidine 20 MG tablet Commonly known as: PEPCID Take 1 tablet (20 mg total) by mouth daily. Start taking on: August 02, 2019   feeding supplement (ENSURE ENLIVE) Liqd Take 237 mLs by mouth 2 (two) times daily between meals.   gabapentin 400 MG capsule Commonly known as: NEURONTIN Take 1 capsule (400 mg total) by mouth 3 (three) times daily.   insulin degludec 200 UNIT/ML FlexTouch Pen Commonly known as: TRESIBA Inject 20 Units into the skin at bedtime. What changed: when to take this   levothyroxine 50 MCG tablet Commonly known as: SYNTHROID Take 1 tablet (50 mcg total) by mouth daily before breakfast.   metFORMIN 1000 MG tablet Commonly known as: GLUCOPHAGE Take 1 tablet (1,000 mg total) by mouth 2 (two) times daily with a meal.   omeprazole 20 MG capsule Commonly known as: PRILOSEC Take 1 capsule (20 mg total) by mouth daily.   zinc sulfate 220  (50 Zn) MG capsule Take 1 capsule (220 mg total) by mouth daily. Start taking on: August 02, 2019            Durable Medical Equipment  (From admission, onward)         Start     Ordered   08/01/19 1035  For home use only DME oxygen  Once       Comments: 2 Liters at rest and 4 L with walking  Question Answer Comment  Length of Need 12 Months   Mode or (Route) Nasal cannula   Liters per Minute 2   Frequency Continuous (stationary and portable oxygen unit needed)   Oxygen conserving device Yes   Oxygen delivery system Gas      08/01/19 1035  DISCHARGE INSTRUCTIONS:   Follow-up PMD 2 weeks Follow-up pulmonary 1 month  If you experience worsening of your admission symptoms, develop shortness of breath, life threatening emergency, suicidal or homicidal thoughts you must seek medical attention immediately by calling 911 or calling your MD immediately  if symptoms less severe.  You Must read complete instructions/literature along with all the possible adverse reactions/side effects for all the Medicines you take and that have been prescribed to you. Take any new Medicines after you have completely understood and accept all the possible adverse reactions/side effects.   Please note  You were cared for by a hospitalist during your hospital stay. If you have any questions about your discharge medications or the care you received while you were in the hospital after you are discharged, you can call the unit and asked to speak with the hospitalist on call if the hospitalist that took care of you is not available. Once you are discharged, your primary care physician will handle any further medical issues. Please note that NO REFILLS for any discharge medications will be authorized once you are discharged, as it is imperative that you return to your primary care physician (or establish a relationship with a primary care physician if you do not have one) for your aftercare needs so  that they can reassess your need for medications and monitor your lab values.    Today   CHIEF COMPLAINT:   Chief Complaint  Patient presents with  . Weakness    HISTORY OF PRESENT ILLNESS:  Nathan Wong  is a 64 y.o. male came in with weakness and found to be Covid positive with pneumonia   VITAL SIGNS:  Blood pressure 113/84, pulse 86, temperature (!) 97.4 F (36.3 C), resp. rate 18, height 6' (1.829 m), weight 95.3 kg, SpO2 94 %.  I/O:    Intake/Output Summary (Last 24 hours) at 08/01/2019 1547 Last data filed at 08/01/2019 0400 Gross per 24 hour  Intake 200 ml  Output 1300 ml  Net -1100 ml    PHYSICAL EXAMINATION:  GENERAL:  64 y.o.-year-old patient lying in the bed with no acute distress.  EYES: Pupils equal, round, reactive to light and accommodation. No scleral icterus. Extraocular muscles intact.  HEENT: Head atraumatic, normocephalic. Oropharynx and nasopharynx clear.   LUNGS: Decreased breath sounds bilaterally, no wheezing, rales,rhonchi or crepitation. No use of accessory muscles of respiration.  CARDIOVASCULAR: S1, S2 normal. No murmurs, rubs, or gallops.  ABDOMEN: Soft, non-tender, non-distended.  EXTREMITIES: No pedal edema.  NEUROLOGIC: Cranial nerves II through XII are intact. Muscle strength 5/5 in all extremities. Sensation intact. Gait not checked.  PSYCHIATRIC: The patient is alert and oriented x 3.  SKIN: No obvious rash, lesion, or ulcer.   DATA REVIEW:   CBC Recent Labs  Lab 08/01/19 0458  WBC 6.7  HGB 15.2  HCT 43.5  PLT 224    Chemistries  Recent Labs  Lab 07/31/19 1032 07/31/19 1032 08/01/19 0458  NA 136   < > 135  K 4.1   < > 4.1  CL 96*   < > 96*  CO2 26   < > 28  GLUCOSE 265*   < > 221*  BUN 42*   < > 46*  CREATININE 1.29*   < > 1.27*  CALCIUM 9.4   < > 9.1  MG 1.7  --   --   AST 41   < > 44*  ALT 28   < > 29  ALKPHOS 85   < >  77  BILITOT 1.4*   < > 1.3*   < > = values in this interval not displayed.      Microbiology Results  Results for orders placed or performed during the hospital encounter of 07/21/19  SARS Coronavirus 2 by RT PCR (hospital order, performed in [facility] hospital lab) Nasopharyngeal Nasopharyngeal Swab     Status: Abnormal   Collection Time: 07/21/19  6:23 PM   Specimen: Nasopharyngeal Swab  Result Value Ref Range Status   SARS Coronavirus 2 POSITIVE (A) NEGATIVE Final    Comment: RESULT CALLED TO, READ BACK BY AND VERIFIED WITH: ALEX CARRICO ON 07/21/19 AT 1913 QSD (NOTE) SARS-CoV-2 target nucleic acids are DETECTED  SARS-CoV-2 RNA is generally detectable in upper respiratory specimens  during the acute phase of infection.  Positive results are indicative  of the presence of the identified virus, but do not rule out bacterial infection or co-infection with other pathogens not detected by the test.  Clinical correlation with patient history and  other diagnostic information is necessary to determine patient infection status.  The expected result is negative.  Fact Sheet for Patients:   StrictlyIdeas.no   Fact Sheet for Healthcare Providers:   BankingDealers.co.za    This test is not yet approved or cleared by the [place] FDA and  has been authorized for detection and/or diagnosis of SARS-CoV-2 by FDA under an Emergency Use Authorization (EUA).  This EUA will remain in effect (meaning this t est can be used) for the duration of  the COVID-19 declaration under Section 564(b)(1) of the Act, 21 U.S.C. section 360-bbb-3(b)(1), unless the authorization is terminated or revoked sooner.  Performed at [facility], [address] [place], [place] [address]     RADIOLOGY:  CT ANGIO CHEST PE W OR WO CONTRAST  Result Date: 07/31/2019 CLINICAL DATA:  Shortness of breath.  COVID-19 positive patient. EXAM: CT ANGIOGRAPHY CHEST WITH CONTRAST TECHNIQUE: Multidetector CT imaging of the chest was  performed using the standard protocol during bolus administration of intravenous contrast. Multiplanar CT image reconstructions and MIPs were obtained to evaluate the vascular anatomy. CONTRAST:  70m OMNIPAQUE IOHEXOL 350 MG/ML SOLN COMPARISON:  Chest x-ray July 30, 2019. CT scan of the chest July 26, 2019. FINDINGS: Cardiovascular: Three-vessel coronary artery calcifications. The heart is unchanged. The thoracic aorta remains nonaneurysmal with atherosclerotic change in no dissection. No pulmonary emboli are identified. Mediastinum/Nodes: The esophagus remains thick walled and patulous, similar in the interval. No adenopathy. No effusions. The thyroid is normal. Lungs/Pleura: Central airways are normal. No pneumothorax. Worsening bilateral pulmonary infiltrates consistent with the patient's known COVID-19 pneumonia. Upper Abdomen: No acute abnormality. Musculoskeletal: No chest wall abnormality. No acute or significant osseous findings. Review of the MIP images confirms the above findings. IMPRESSION: 1. Worsening multifocal COVID-19 pneumonia. 2. No pulmonary emboli. 3. The esophagus remains thick walled and patulous. The etiology is unclear. When the patient is able, recommend endoscopy. Aortic Atherosclerosis (ICD10-I70.0). Electronically Signed   By: DDorise BullionIII M.D   On: 07/31/2019 11:47     Management plans discussed with the patient and he told me he is going home today.  Case discussed with pulmonary and he was okay with patient going home.  Case discussed with the patient's daughter and given update.  Home oxygen and home health being set up.  CODE STATUS:     Code Status Orders  (From admission, onward)         Start  Ordered   07/26/19 1709  Full code  Continuous        07/26/19 1715        Code Status History    Date Active Date Inactive Code Status Order ID Comments User Context   01/04/2016 0755 01/05/2016 1309 Full Code [id number]  Harrie Foreman, MD ED   Advance  Care Planning Activity      TOTAL TIME TAKING CARE OF THIS PATIENT: 35 minutes.    Loletha Grayer M.D on 08/01/2019 at 3:47 PM  Between 7am to 6pm - Pager - [phone]  After 6pm go to [email or web address] - password [email or web address]  [facility]  CC: Primary care physician; Cletis Athens, MD

## 2019-08-01 NOTE — Discharge Instructions (Signed)
COVID-19: How to Protect Yourself and Others Know how it spreads  There is currently no vaccine to prevent coronavirus disease 2019 (COVID-19).  The best way to prevent illness is to avoid being exposed to this virus.  The virus is thought to spread mainly from person-to-person. ? Between people who are in close contact with one another (within about 6 feet). ? Through respiratory droplets produced when an infected person coughs, sneezes or talks. ? These droplets can land in the mouths or noses of people who are nearby or possibly be inhaled into the lungs. ? COVID-19 may be spread by people who are not showing symptoms. Everyone should Clean your hands often  Wash your hands often with soap and water for at least 20 seconds especially after you have been in a public place, or after blowing your nose, coughing, or sneezing.  If soap and water are not readily available, use a hand sanitizer that contains at least 60% alcohol. Cover all surfaces of your hands and rub them together until they feel dry.  Avoid touching your eyes, nose, and mouth with unwashed hands. Avoid close contact  Limit contact with others as much as possible.  Avoid close contact with people who are sick.  Put distance between yourself and other people. ? Remember that some people without symptoms may be able to spread virus. ? This is especially important for people who are at higher risk of getting very GainPain.com.cy Cover your mouth and nose with a mask when around others  You could spread COVID-19 to others even if you do not feel sick.  Everyone should wear a mask in public settings and when around people not living in their household, especially when social distancing is difficult to maintain. ? Masks should not be placed on young children under age 6, anyone who has trouble breathing, or is unconscious, incapacitated or otherwise  unable to remove the mask without assistance.  The mask is meant to protect other people in case you are infected.  Do NOT use a facemask meant for a Dietitian.  Continue to keep about 6 feet between yourself and others. The mask is not a substitute for social distancing. Cover coughs and sneezes  Always cover your mouth and nose with a tissue when you cough or sneeze or use the inside of your elbow.  Throw used tissues in the trash.  Immediately wash your hands with soap and water for at least 20 seconds. If soap and water are not readily available, clean your hands with a hand sanitizer that contains at least 60% alcohol. Clean and disinfect  Clean AND disinfect frequently touched surfaces daily. This includes tables, doorknobs, light switches, countertops, handles, desks, phones, keyboards, toilets, faucets, and sinks. RackRewards.fr  If surfaces are dirty, clean them: Use detergent or soap and water prior to disinfection.  Then, use a household disinfectant. You can see a list of EPA-registered household disinfectants here. michellinders.com 10/07/2018 This information is not intended to replace advice given to you by your health care provider. Make sure you discuss any questions you have with your health care provider. Document Revised: 10/15/2018 Document Reviewed: 08/13/2018 Elsevier Patient Education  [place].   COVID-19 Frequently Asked Questions COVID-19 (coronavirus disease) is an infection that is caused by a large family of viruses. Some viruses cause illness in people and others cause illness in animals like camels, cats, and bats. In some cases, the viruses that cause illness in animals can spread to humans. Where  did the coronavirus come from? In December 2019, [place] told the [facility]) of several cases of lung disease (human respiratory illness). These cases  were linked to an open seafood and livestock market in the city of [place]. The link to the seafood and livestock market suggests that the virus may have spread from animals to humans. However, since that first outbreak in December, the virus has also been shown to spread from person to person. What is the name of the disease and the virus? Disease name Early on, this disease was called novel coronavirus. This is because scientists determined that the disease was caused by a new (novel) respiratory virus. The World Health Organization [facility]) has now named the disease COVID-19, or coronavirus disease. Virus name The virus that causes the disease is called severe acute respiratory syndrome coronavirus 2 (SARS-CoV-2). More information on disease and virus naming World Health Organization [facility]): www.who.int/emergencies/diseases/novel-coronavirus-2019/technical-guidance/naming-the-coronavirus-disease-(covid-2019)-and-the-virus-that-causes-it Who is at risk for complications from coronavirus disease? Some people may be at higher risk for complications from coronavirus disease. This includes older adults and people who have chronic diseases, such as heart disease, diabetes, and lung disease. If you are at higher risk for complications, take these extra precautions:  Stay home as much as possible.  Avoid social gatherings and travel.  Avoid close contact with others. Stay at least 6 ft (2 m) away from others, if possible.  Wash your hands often with soap and water for at least 20 seconds.  Avoid touching your face, mouth, nose, or eyes.  Keep supplies on hand at home, such as food, medicine, and cleaning supplies.  If you must go out in public, wear a cloth face covering or face mask. Make sure your mask covers your nose and mouth. How does coronavirus disease spread? The virus that causes coronavirus disease spreads easily from person to person (is contagious). You may catch the virus  by:  Breathing in droplets from an infected person. Droplets can be spread by a person breathing, speaking, singing, coughing, or sneezing.  Touching something, like a table or a doorknob, that was exposed to the virus (contaminated) and then touching your mouth, nose, or eyes. Can I get the virus from touching surfaces or objects? There is still a lot that we do not know about the virus that causes coronavirus disease. Scientists are basing a lot of information on what they know about similar viruses, such as:  Viruses cannot generally survive on surfaces for long. They need a human body (host) to survive.  It is more likely that the virus is spread by close contact with people who are sick (direct contact), such as through: ? Shaking hands or hugging. ? Breathing in respiratory droplets that travel through the air. Droplets can be spread by a person breathing, speaking, singing, coughing, or sneezing.  It is less likely that the virus is spread when a person touches a surface or object that has the virus on it (indirect contact). The virus may be able to enter the body if the person touches a surface or object and then touches his or her face, eyes, nose, or mouth. Can a person spread the virus without having symptoms of the disease? It may be possible for the virus to spread before a person has symptoms of the disease, but this is most likely not the main way the virus is spreading. It is more likely for the virus to spread by being in close contact with people who are sick and  breathing in the respiratory droplets spread by a person breathing, speaking, singing, coughing, or sneezing. What are the symptoms of coronavirus disease? Symptoms vary from person to person and can range from mild to severe. Symptoms may include:  Fever or chills.  Cough.  Difficulty breathing or feeling short of breath.  Headaches, body aches, or muscle aches.  Runny or stuffy (congested) nose.  Sore  throat.  New loss of taste or smell.  Nausea, vomiting, or diarrhea. These symptoms can appear anywhere from 2 to 14 days after you have been exposed to the virus. Some people may not have any symptoms. If you develop symptoms, call your health care provider. People with severe symptoms may need hospital care. Should I be tested for this virus? Your health care provider will decide whether to test you based on your symptoms, history of exposure, and your risk factors. How does a health care provider test for this virus? Health care providers will collect samples to send for testing. Samples may include:  Taking a swab of fluid from the back of your nose and throat, your nose, or your throat.  Taking fluid from the lungs by having you cough up mucus (sputum) into a sterile cup.  Taking a blood sample. Is there a treatment or vaccine for this virus? Currently, there is no vaccine to prevent coronavirus disease. Also, there are no medicines like antibiotics or antivirals to treat the virus. A person who becomes sick is given supportive care, which means rest and fluids. A person may also relieve his or her symptoms by using over-the-counter medicines that treat sneezing, coughing, and runny nose. These are the same medicines that a person takes for the common cold. If you develop symptoms, call your health care provider. People with severe symptoms may need hospital care. What can I do to protect myself and my family from this virus?     You can protect yourself and your family by taking the same actions that you would take to prevent the spread of other viruses. Take the following actions:  Wash your hands often with soap and water for at least 20 seconds. If soap and water are not available, use alcohol-based hand sanitizer.  Avoid touching your face, mouth, nose, or eyes.  Cough or sneeze into a tissue, sleeve, or elbow. Do not cough or sneeze into your hand or the air. ? If you cough  or sneeze into a tissue, throw it away immediately and wash your hands.  Disinfect objects and surfaces that you frequently touch every day.  Stay away from people who are sick.  Avoid going out in public, follow guidance from your state and local health authorities.  Avoid crowded indoor spaces. Stay at least 6 ft (2 m) away from others.  If you must go out in public, wear a cloth face covering or face mask. Make sure your mask covers your nose and mouth.  Stay home if you are sick, except to get medical care. Call your health care provider before you get medical care. Your health care provider will tell you how long to stay home.  Make sure your vaccines are up to date. Ask your health care provider what vaccines you need. What should I do if I need to travel? Follow travel recommendations from your local health authority, the CDC, and WHO. Travel information and advice  Centers for Disease Control and Prevention (CDC): BodyEditor.hu  World Health Organization [facility]): ThirdIncome.ca Know the risks and take action to  protect your health  You are at higher risk of getting coronavirus disease if you are traveling to areas with an outbreak or if you are exposed to travelers from areas with an outbreak.  Wash your hands often and practice good hygiene to lower the risk of catching or spreading the virus. What should I do if I am sick? General instructions to stop the spread of infection  Wash your hands often with soap and water for at least 20 seconds. If soap and water are not available, use alcohol-based hand sanitizer.  Cough or sneeze into a tissue, sleeve, or elbow. Do not cough or sneeze into your hand or the air.  If you cough or sneeze into a tissue, throw it away immediately and wash your hands.  Stay home unless you must get medical care. Call your health care provider or local  health authority before you get medical care.  Avoid public areas. Do not take public transportation, if possible.  If you can, wear a mask if you must go out of the house or if you are in close contact with someone who is not sick. Make sure your mask covers your nose and mouth. Keep your home clean  Disinfect objects and surfaces that are frequently touched every day. This may include: ? Counters and tables. ? Doorknobs and light switches. ? Sinks and faucets. ? Electronics such as phones, remote controls, keyboards, computers, and tablets.  Wash dishes in hot, soapy water or use a dishwasher. Air-dry your dishes.  Wash laundry in hot water. Prevent infecting other household members  Let healthy household members care for children and pets, if possible. If you have to care for children or pets, wash your hands often and wear a mask.  Sleep in a different bedroom or bed, if possible.  Do not share personal items, such as razors, toothbrushes, deodorant, combs, brushes, towels, and washcloths. Where to find more information Centers for Disease Control and Prevention (CDC)  Information and news updates: https://www.butler-gonzalez.com/ World Health Organization [facility])  Information and news updates: MissExecutive.com.ee  Coronavirus health topic: https://www.castaneda.info/  Questions and answers on COVID-19: OpportunityDebt.at  Global tracker: who.sprinklr.com American Academy of Pediatrics (AAP)  Information for families: www.healthychildren.org/English/health-issues/conditions/chest-lungs/Pages/2019-Novel-Coronavirus.aspx The coronavirus situation is changing rapidly. Check your local health authority website or the CDC and [facility] websites for updates and news. When should I contact a health care provider?  Contact your health care provider if you have symptoms of an infection, such as fever or cough,  and you: ? Have been near anyone who is known to have coronavirus disease. ? Have come into contact with a person who is suspected to have coronavirus disease. ? Have traveled to an area where there is an outbreak of COVID-19. When should I get emergency medical care?  Get help right away by calling your local emergency services (911 in the [place] if you have: ? Trouble breathing. ? Pain or pressure in your chest. ? Confusion. ? Blue-tinged lips and fingernails. ? Difficulty waking from sleep. ? Symptoms that get worse. Let the emergency medical personnel know if you think you have coronavirus disease. Summary  A new respiratory virus is spreading from person to person and causing COVID-19 (coronavirus disease).  The virus that causes COVID-19 appears to spread easily. It spreads from one person to another through droplets from breathing, speaking, singing, coughing, or sneezing.  Older adults and those with chronic diseases are at higher risk of disease. If you are at higher risk for  complications, take extra precautions.  There is currently no vaccine to prevent coronavirus disease. There are no medicines, such as antibiotics or antivirals, to treat the virus.  You can protect yourself and your family by washing your hands often, avoiding touching your face, and covering your coughs and sneezes. This information is not intended to replace advice given to you by your health care provider. Make sure you discuss any questions you have with your health care provider. Document Revised: 11/20/2018 Document Reviewed: 05/19/2018 Elsevier Patient Education  2020 [employer].

## 2019-08-01 NOTE — Progress Notes (Signed)
   08/01/19 1439  Orthostatic Lying   BP- Lying 119/74  Pulse- Lying 85  Orthostatic Sitting  BP- Sitting 101/86  Pulse- Sitting 115  Orthostatic Standing at 0 minutes  BP- Standing at 0 minutes 94/66  Pulse- Standing at 0 minutes 133

## 2019-08-01 NOTE — TOC Transition Note (Signed)
Transition of Care [facility]) - CM/SW Discharge Note   Patient Details  Name: Nathan Wong MRN: [id number] Date of Birth: October 26, 1955  [facility]) CM/SW Contact:  Maud Deed, LCSW Phone Number: 08/01/2019, 11:41 AM   Clinical Narrative:    Pt medically stable for discharge per MD. Pt will be transported home by his daughter and [facility] will follow for PT. CSW called Jermaine and arranged DME Oxygen with [facility], O2 to be delivered to pt's room.   Final next level of care: Home w Home Health Services Barriers to Discharge: No Barriers Identified   Patient Goals and CMS Choice Patient states their goals for this hospitalization and ongoing recovery are:: to go home CMS Medicare.gov Compare Post Acute Care list provided to:: Patient Choice offered to / list presented to : Patient  Discharge Placement                Patient to be transferred to facility by: daughter Name of family member notified: Crystal Patient and family notified of of transfer: 08/01/19  Discharge Plan and Services     Post Acute Care Choice: Home Health            DME Agency: Other - Comment [facility]) Date DME Agency Contacted: 08/01/19 Time DME Agency Contacted: [phone] Representative spoke with at DME Agency: Shaune Leeks HH Arranged: PT HH Agency: [facility] Date [facility] Agency Contacted: 08/01/19 Time HH Agency Contacted: [phone]    Social Determinants of Health (SDOH) Interventions     Readmission Risk Interventions No flowsheet data found.

## 2019-08-01 NOTE — Progress Notes (Signed)
Review AVS with patient and called daughter on phone (crystal ) on phone and reviewed AVS with her answered all questions.

## 2019-08-01 NOTE — Progress Notes (Signed)
   08/01/19 1449  Orthostatic Standing at 3 minutes  BP- Standing at 3 minutes (!) 106/94  Pulse- Standing at 3 minutes 159

## 2019-08-01 NOTE — Consult Note (Signed)
Name: Nathan Wong MRN: [id number] DOB: [birth year]     CONSULTATION DATE: 08/01/2019   REFERRING MD : weiting  CHIEF COMPLAINT: COVID 19 pneumonia and hypoxia  STUDIES:     6/26  CT chest Independently reviewed by Me Extensive b/l Interstitial infiltrates  HISTORY OF PRESENT ILLNESS: 64 year old male with history of essential hypertension and type 2 diabetes who present to the hospital with chief complaints of shortness of breath.    Symptoms started about 6 days ago, at that time, he developed fatigue, nausea, poor appetite.  He also lost taste and smell.  He did not have any diarrhea.  He did not have any short of breath at that time.    He tested positive for COVID-19 on 6/16.   + developed shortness of breath and cough.  Symptom progressively getting worse.  For the last 2 or 3 days, patient can barely move.  His sleep in a recliner at nighttime.  He has not been eating and drinking for the last 3 days, he lost about 10 pounds of body weight in 3 days.  He feels dizzy and lightheaded.  He has a headache. Upon arriving the hospital, he was not requiring oxygen, he was found to have acute kidney injury with creatinine of 1.7.  CT scan showed scattered groundglass changes associate with pleural.  He is admitted to the hospital for further treatment.  Patient states he feels better and wants to go home Patient requiring minimal oxygen    PAST MEDICAL HISTORY :   has a past medical history of Arthritis, Diabetes mellitus without complication ([place]), H/O hernia repair, and Hypertension.  has a past surgical history that includes Hernia repair. Prior to Admission medications   Medication Sig Start Date End Date Taking? Authorizing Provider  benzonatate (TESSALON PERLES) 100 MG capsule Take 1-2 tabs TID prn cough 07/21/19  Yes Menshew, Dannielle Karvonen, PA-C  gabapentin (NEURONTIN) 400 MG capsule Take 1 capsule (400 mg total) by mouth 3 (three) times daily. 12/08/18  Yes Iloabachie,  Chioma E, NP  glipiZIDE (GLUCOTROL) 5 MG tablet Take 1 tablet (5 mg total) by mouth in the morning. With food 05/13/19  Yes Iloabachie, Chioma E, NP  Insulin Degludec 200 UNIT/ML SOPN Inject 20 Units into the skin at bedtime. Patient taking differently: Inject 20 Units into the skin in the morning.  12/08/18  Yes Iloabachie, Chioma E, NP  levothyroxine (SYNTHROID) 50 MCG tablet Take 1 tablet (50 mcg total) by mouth daily before breakfast. 05/13/19  Yes Iloabachie, Chioma E, NP  lisinopril (ZESTRIL) 10 MG tablet Take 1 tablet (10 mg total) by mouth daily. 06/08/19  Yes Iloabachie, Chioma E, NP  metFORMIN (GLUCOPHAGE) 1000 MG tablet Take 1 tablet (1,000 mg total) by mouth 2 (two) times daily with a meal. 05/13/19  Yes Iloabachie, Chioma E, NP  omeprazole (PRILOSEC) 20 MG capsule Take 1 capsule (20 mg total) by mouth daily. 06/08/19  Yes Iloabachie, Chioma E, NP  acetaminophen (TYLENOL) 500 MG tablet Take 500 mg by mouth every 6 (six) hours as needed for mild pain.     [provider]  albuterol (VENTOLIN HFA) 108 (90 Base) MCG/ACT inhaler Inhale 2 puffs into the lungs every 6 (six) hours as needed. 07/21/19   Menshew, Dannielle Karvonen, PA-C  aspirin EC 81 MG tablet Take 81 mg by mouth daily.    [provider]  blood glucose meter kit and supplies KIT Dispense based on patient and insurance preference. Use up  to four times daily as directed. (FOR ICD-9 250.00, 250.01). 10/24/18   Iloabachie, Chioma E, NP  Blood Pressure KIT 1 kit by Does not apply route daily. 12/08/18   Iloabachie, Chioma E, NP  cholecalciferol (VITAMIN D3) 25 MCG (1000 UT) tablet Take 1,000 Units by mouth daily.    [provider]   No Known Allergies  FAMILY HISTORY:  family history includes CAD in his mother; Diabetes in his sister; Hypertension in his sister. SOCIAL HISTORY:  reports that he has never smoked. He has never used smokeless tobacco. He reports that he does not drink alcohol and does not use  drugs.    Review of Systems:  Gen:  Denies  fever, sweats, chills weigh loss  HEENT: Denies blurred vision, double vision, ear pain, eye pain, hearing loss, nose bleeds, sore throat Cardiac:  No dizziness, chest pain or heaviness, chest tightness,edema, No JVD Resp:   No cough, -sputum production, -shortness of breath,-wheezing, -hemoptysis,  Gi: Denies swallowing difficulty, stomach pain, nausea or vomiting, diarrhea, constipation, bowel incontinence Gu:  Denies bladder incontinence, burning urine Ext:   Denies Joint pain, stiffness or swelling Skin: Denies  skin rash, easy bruising or bleeding or hives Endoc:  Denies polyuria, polydipsia , polyphagia or weight change Psych:   Denies depression, insomnia or hallucinations  Other:  All other systems negative     VITAL SIGNS: Temp:  [97.6 F (36.4 C)-97.9 F (36.6 C)] 97.8 F (36.6 C) (06/26 1721) Pulse Rate:  [87-94] 87 (06/26 1721) Resp:  [18] 18 (06/26 1721) BP: (117-120)/(85-90) 117/85 (06/26 1721) SpO2:  [95 %-97 %] 95 % (06/26 1827)     SpO2: 95 % O2 Flow Rate (L/min): 5 L/min    Physical Examination:   GENERAL:NAD, no fevers, chills, no weakness no fatigue HEAD: Normocephalic, atraumatic.  EYES: PERLA, EOMI No scleral icterus.  MOUTH: Moist mucosal membrane.  EAR, NOSE, THROAT: Clear without exudates. No external lesions.  NECK: Supple.  PULMONARY: CTA B/L no wheezing, rhonchi, crackles CARDIOVASCULAR: S1 and S2. Regular rate and rhythm. No murmurs GASTROINTESTINAL: Soft, nontender, nondistended. Positive bowel sounds.  MUSCULOSKELETAL: No swelling, clubbing, or edema.  NEUROLOGIC: No gross focal neurological deficits. 5/5 strength all extremities SKIN: No ulceration, lesions, rashes, or cyanosis.  PSYCHIATRIC: Insight, judgment intact. -depression -anxiety ALL OTHER ROS ARE NEGATIVE   MEDICATIONS: I have reviewed all medications and confirmed regimen as documented    CULTURE RESULTS   No results  found for this or any previous visit (from the past 240 hour(s)).        IMAGING    CT ANGIO CHEST PE W OR WO CONTRAST  Result Date: 07/31/2019 CLINICAL DATA:  Shortness of breath.  COVID-19 positive patient. EXAM: CT ANGIOGRAPHY CHEST WITH CONTRAST TECHNIQUE: Multidetector CT imaging of the chest was performed using the standard protocol during bolus administration of intravenous contrast. Multiplanar CT image reconstructions and MIPs were obtained to evaluate the vascular anatomy. CONTRAST:  24m OMNIPAQUE IOHEXOL 350 MG/ML SOLN COMPARISON:  Chest x-ray July 30, 2019. CT scan of the chest July 26, 2019. FINDINGS: Cardiovascular: Three-vessel coronary artery calcifications. The heart is unchanged. The thoracic aorta remains nonaneurysmal with atherosclerotic change in no dissection. No pulmonary emboli are identified. Mediastinum/Nodes: The esophagus remains thick walled and patulous, similar in the interval. No adenopathy. No effusions. The thyroid is normal. Lungs/Pleura: Central airways are normal. No pneumothorax. Worsening bilateral pulmonary infiltrates consistent with the patient's known COVID-19 pneumonia. Upper Abdomen: No acute abnormality. Musculoskeletal: No chest wall  abnormality. No acute or significant osseous findings. Review of the MIP images confirms the above findings. IMPRESSION: 1. Worsening multifocal COVID-19 pneumonia. 2. No pulmonary emboli. 3. The esophagus remains thick walled and patulous. The etiology is unclear. When the patient is able, recommend endoscopy. Aortic Atherosclerosis (ICD10-I70.0). Electronically Signed   By: Dorise Bullion III M.D   On: 07/31/2019 11:47       ASSESSMENT AND PLAN SYNOPSIS  COVID-19 infection,  pneumonia/pneumonitis Maybe the start of  fibrotic phase Continue  steroids  IV remdisivir completed S/p TOCI Aggressive pulm toilet recommended OOB to chair as tolerated  Pulmonary hygiene Continue proning as tolerated due to  hypoxia    Maintain airborne and contact precautions  As needed bronchodilators (MDI) Vitamin C and zinc Antitussives Continue IV abx as prescibed Lasix as tolerated  PATIENT WANTS TO GO HOME, WILL RELAY THIS TO DR Earleen Newport   No other recs from pulmonary Point of view. Will sign off at this time.   Patient are satisfied with Plan of action and management. All questions answered  Corrin Parker, M.D.  [facility]  Medical Director [place] Director [facility]io[facility] Department

## 2019-08-02 ENCOUNTER — Other Ambulatory Visit: Payer: Self-pay

## 2019-08-02 NOTE — Patient Outreach (Signed)
[facility]) Care Management  08/02/2019  KALETH KOY [birth year] [id number]     Transition of Care Referral  Referral Date: 08/02/2019 Referral Source: [facility] Discharge Report Date of Discharge: 08/01/2019 Facility: [facility] Insurance: [facility] Medicare   Referral received. Transition of care calls being completed via EMMI-automated calls. RN CM will outreach patient for any red flags received  Plan: RN CM will close case at this time.    Antionette Fairy, RN,BSN,CCM [facility] Care Management Telephonic Care Management Coordinator Direct Phone: [phone] Toll Free: [phone] Fax: [phone]

## 2019-08-31 DIAGNOSIS — U071 COVID-19: Secondary | ICD-10-CM | POA: Diagnosis not present

## 2019-09-06 DIAGNOSIS — I10 Essential (primary) hypertension: Secondary | ICD-10-CM | POA: Diagnosis not present

## 2019-09-06 DIAGNOSIS — E039 Hypothyroidism, unspecified: Secondary | ICD-10-CM | POA: Diagnosis not present

## 2019-09-06 DIAGNOSIS — Z1211 Encounter for screening for malignant neoplasm of colon: Secondary | ICD-10-CM | POA: Diagnosis not present

## 2019-09-06 DIAGNOSIS — E1140 Type 2 diabetes mellitus with diabetic neuropathy, unspecified: Secondary | ICD-10-CM | POA: Diagnosis not present

## 2019-09-06 DIAGNOSIS — U071 COVID-19: Secondary | ICD-10-CM | POA: Diagnosis not present

## 2019-09-06 DIAGNOSIS — J80 Acute respiratory distress syndrome: Secondary | ICD-10-CM | POA: Diagnosis not present

## 2019-09-06 DIAGNOSIS — I517 Cardiomegaly: Secondary | ICD-10-CM | POA: Diagnosis not present

## 2019-09-06 DIAGNOSIS — Z125 Encounter for screening for malignant neoplasm of prostate: Secondary | ICD-10-CM | POA: Diagnosis not present

## 2019-09-06 DIAGNOSIS — Z0000 Encounter for general adult medical examination without abnormal findings: Secondary | ICD-10-CM | POA: Diagnosis not present

## 2019-09-06 DIAGNOSIS — R918 Other nonspecific abnormal finding of lung field: Secondary | ICD-10-CM | POA: Diagnosis not present

## 2019-09-06 DIAGNOSIS — Z794 Long term (current) use of insulin: Secondary | ICD-10-CM | POA: Diagnosis not present

## 2019-10-01 DIAGNOSIS — U071 COVID-19: Secondary | ICD-10-CM | POA: Diagnosis not present

## 2019-11-01 DIAGNOSIS — U071 COVID-19: Secondary | ICD-10-CM | POA: Diagnosis not present

## 2019-12-01 DIAGNOSIS — U071 COVID-19: Secondary | ICD-10-CM | POA: Diagnosis not present

## 2019-12-27 ENCOUNTER — Other Ambulatory Visit: Payer: Self-pay

## 2019-12-27 ENCOUNTER — Emergency Department
Admission: EM | Admit: 2019-12-27 | Discharge: 2019-12-27 | Disposition: A | Discharge status: Home / Self Care | Payer: Medicare HMO | Attending: Emergency Medicine | Admitting: Emergency Medicine

## 2019-12-27 DIAGNOSIS — R1013 Epigastric pain: Secondary | ICD-10-CM | POA: Diagnosis not present

## 2019-12-27 DIAGNOSIS — K219 Gastro-esophageal reflux disease without esophagitis: Secondary | ICD-10-CM | POA: Diagnosis not present

## 2019-12-27 DIAGNOSIS — Z5321 Procedure and treatment not carried out due to patient leaving prior to being seen by health care provider: Secondary | ICD-10-CM | POA: Diagnosis not present

## 2019-12-27 LAB — CBC
HCT: 45.9 % (ref 39.0–52.0)
Hemoglobin: 15.4 g/dL (ref 13.0–17.0)
MCH: 29.2 pg (ref 26.0–34.0)
MCHC: 33.6 g/dL (ref 30.0–36.0)
MCV: 86.9 fL (ref 80.0–100.0)
Platelets: 188 10*3/uL (ref 150–400)
RBC: 5.28 MIL/uL (ref 4.22–5.81)
RDW: 14.2 % (ref 11.5–15.5)
WBC: 7.7 10*3/uL (ref 4.0–10.5)
nRBC: 0 % (ref 0.0–0.2)

## 2019-12-27 LAB — COMPREHENSIVE METABOLIC PANEL
ALT: 20 U/L (ref 0–44)
AST: 32 U/L (ref 15–41)
Albumin: 4.9 g/dL (ref 3.5–5.0)
Alkaline Phosphatase: 83 U/L (ref 38–126)
Anion gap: 14 (ref 5–15)
BUN: 13 mg/dL (ref 8–23)
CO2: 26 mmol/L (ref 22–32)
Calcium: 9.7 mg/dL (ref 8.9–10.3)
Chloride: 98 mmol/L (ref 98–111)
Creatinine, Ser: 1.21 mg/dL (ref 0.61–1.24)
GFR, Estimated: >60 mL/min (ref >60)
Glucose, Bld: 199 mg/dL — ABNORMAL HIGH (ref 70–99)
Potassium: 4.1 mmol/L (ref 3.5–5.1)
Sodium: 138 mmol/L (ref 135–145)
Total Bilirubin: 1 mg/dL (ref 0.3–1.2)
Total Protein: 8.5 g/dL — ABNORMAL HIGH (ref 6.5–8.1)

## 2019-12-27 LAB — URINALYSIS, COMPLETE (UACMP) WITH MICROSCOPIC
Bilirubin Urine: NEGATIVE
Glucose, UA: NEGATIVE mg/dL
Ketones, ur: NEGATIVE mg/dL
Nitrite: NEGATIVE
Protein, ur: NEGATIVE mg/dL
Specific Gravity, Urine: 1.012 (ref 1.005–1.030)
pH: 5 (ref 5.0–8.0)

## 2019-12-27 LAB — LIPASE, BLOOD: Lipase: 44 U/L (ref 11–51)

## 2019-12-27 NOTE — ED Notes (Signed)
No answer when called several times from lobby 

## 2019-12-27 NOTE — ED Triage Notes (Addendum)
Pt via POV from home. Pt states that he took his gabapentin and 1g of tylenol at around 1200 and ever since then he has been having epigastric pain that radiates to his back and pt states he vomited approx 5 times since then. Denies diarrhea. Denies fever. Pt is A&Ox4 and NAD. Denies CP and SOB. Pt has a hx of GERD.

## 2019-12-27 NOTE — ED Notes (Signed)
No answer when called several times from lobby & cell phone 

## 2020-01-01 DIAGNOSIS — U071 COVID-19: Secondary | ICD-10-CM | POA: Diagnosis not present

## 2020-01-06 DIAGNOSIS — G8929 Other chronic pain: Secondary | ICD-10-CM | POA: Diagnosis not present

## 2020-01-06 DIAGNOSIS — Z135 Encounter for screening for eye and ear disorders: Secondary | ICD-10-CM | POA: Diagnosis not present

## 2020-01-06 DIAGNOSIS — Z01818 Encounter for other preprocedural examination: Secondary | ICD-10-CM | POA: Diagnosis not present

## 2020-01-06 DIAGNOSIS — E039 Hypothyroidism, unspecified: Secondary | ICD-10-CM | POA: Diagnosis not present

## 2020-01-06 DIAGNOSIS — Z0000 Encounter for general adult medical examination without abnormal findings: Secondary | ICD-10-CM | POA: Diagnosis not present

## 2020-01-06 DIAGNOSIS — E119 Type 2 diabetes mellitus without complications: Secondary | ICD-10-CM | POA: Diagnosis not present

## 2020-01-06 DIAGNOSIS — R0600 Dyspnea, unspecified: Secondary | ICD-10-CM | POA: Diagnosis not present

## 2020-01-06 DIAGNOSIS — M25561 Pain in right knee: Secondary | ICD-10-CM | POA: Diagnosis not present

## 2020-01-06 DIAGNOSIS — I10 Essential (primary) hypertension: Secondary | ICD-10-CM | POA: Diagnosis not present

## 2020-01-06 DIAGNOSIS — Z23 Encounter for immunization: Secondary | ICD-10-CM | POA: Diagnosis not present

## 2020-01-06 DIAGNOSIS — Z794 Long term (current) use of insulin: Secondary | ICD-10-CM | POA: Diagnosis not present

## 2020-01-10 DIAGNOSIS — K219 Gastro-esophageal reflux disease without esophagitis: Secondary | ICD-10-CM | POA: Diagnosis not present

## 2020-01-10 DIAGNOSIS — Z8616 Personal history of COVID-19: Secondary | ICD-10-CM | POA: Diagnosis not present

## 2020-01-10 DIAGNOSIS — Z1211 Encounter for screening for malignant neoplasm of colon: Secondary | ICD-10-CM | POA: Diagnosis not present

## 2020-01-11 DIAGNOSIS — Z23 Encounter for immunization: Secondary | ICD-10-CM | POA: Diagnosis not present

## 2020-01-11 DIAGNOSIS — Z7189 Other specified counseling: Secondary | ICD-10-CM | POA: Diagnosis not present

## 2020-01-25 DIAGNOSIS — M5416 Radiculopathy, lumbar region: Secondary | ICD-10-CM | POA: Diagnosis not present

## 2020-01-25 DIAGNOSIS — G8929 Other chronic pain: Secondary | ICD-10-CM | POA: Diagnosis not present

## 2020-01-25 DIAGNOSIS — M5441 Lumbago with sciatica, right side: Secondary | ICD-10-CM | POA: Diagnosis not present

## 2020-01-25 DIAGNOSIS — M25561 Pain in right knee: Secondary | ICD-10-CM | POA: Diagnosis not present

## 2020-01-25 DIAGNOSIS — M545 Low back pain, unspecified: Secondary | ICD-10-CM | POA: Diagnosis not present

## 2020-01-31 DIAGNOSIS — U071 COVID-19: Secondary | ICD-10-CM | POA: Diagnosis not present

## 2020-02-07 DIAGNOSIS — Z01818 Encounter for other preprocedural examination: Secondary | ICD-10-CM | POA: Diagnosis not present

## 2020-02-08 DIAGNOSIS — Z23 Encounter for immunization: Secondary | ICD-10-CM | POA: Diagnosis not present

## 2020-02-08 DIAGNOSIS — Z1211 Encounter for screening for malignant neoplasm of colon: Secondary | ICD-10-CM | POA: Diagnosis not present

## 2020-02-08 DIAGNOSIS — Z7189 Other specified counseling: Secondary | ICD-10-CM | POA: Diagnosis not present

## 2020-02-08 DIAGNOSIS — K640 First degree hemorrhoids: Secondary | ICD-10-CM | POA: Diagnosis not present

## 2020-02-08 DIAGNOSIS — K5730 Diverticulosis of large intestine without perforation or abscess without bleeding: Secondary | ICD-10-CM | POA: Diagnosis not present

## 2020-02-08 HISTORY — PX: COLONOSCOPY: SHX174

## 2020-02-17 DIAGNOSIS — G8929 Other chronic pain: Secondary | ICD-10-CM | POA: Diagnosis not present

## 2020-02-17 DIAGNOSIS — M5441 Lumbago with sciatica, right side: Secondary | ICD-10-CM | POA: Diagnosis not present

## 2020-03-02 DIAGNOSIS — U071 COVID-19: Secondary | ICD-10-CM | POA: Diagnosis not present

## 2020-04-02 DIAGNOSIS — U071 COVID-19: Secondary | ICD-10-CM | POA: Diagnosis not present

## 2020-04-30 DIAGNOSIS — U071 COVID-19: Secondary | ICD-10-CM | POA: Diagnosis not present

## 2020-05-31 DIAGNOSIS — U071 COVID-19: Secondary | ICD-10-CM | POA: Diagnosis not present

## 2020-06-12 DIAGNOSIS — Z794 Long term (current) use of insulin: Secondary | ICD-10-CM | POA: Diagnosis not present

## 2020-06-12 DIAGNOSIS — E039 Hypothyroidism, unspecified: Secondary | ICD-10-CM | POA: Diagnosis not present

## 2020-06-12 DIAGNOSIS — E1140 Type 2 diabetes mellitus with diabetic neuropathy, unspecified: Secondary | ICD-10-CM | POA: Diagnosis not present

## 2020-06-12 DIAGNOSIS — Z23 Encounter for immunization: Secondary | ICD-10-CM | POA: Diagnosis not present

## 2020-06-12 DIAGNOSIS — I10 Essential (primary) hypertension: Secondary | ICD-10-CM | POA: Diagnosis not present

## 2020-06-12 DIAGNOSIS — Z125 Encounter for screening for malignant neoplasm of prostate: Secondary | ICD-10-CM | POA: Diagnosis not present

## 2020-06-12 DIAGNOSIS — Z0000 Encounter for general adult medical examination without abnormal findings: Secondary | ICD-10-CM | POA: Diagnosis not present

## 2020-06-30 DIAGNOSIS — U071 COVID-19: Secondary | ICD-10-CM | POA: Diagnosis not present

## 2020-07-31 DIAGNOSIS — U071 COVID-19: Secondary | ICD-10-CM | POA: Diagnosis not present

## 2020-08-02 ENCOUNTER — Emergency Department
Admission: EM | Admit: 2020-08-02 | Discharge: 2020-08-02 | Disposition: A | Discharge status: Home / Self Care | Payer: Medicare HMO | Attending: Emergency Medicine | Admitting: Emergency Medicine

## 2020-08-02 ENCOUNTER — Emergency Department: Payer: Medicare HMO

## 2020-08-02 ENCOUNTER — Other Ambulatory Visit: Payer: Self-pay

## 2020-08-02 ENCOUNTER — Encounter: Payer: Self-pay | Admitting: Emergency Medicine

## 2020-08-02 DIAGNOSIS — M419 Scoliosis, unspecified: Secondary | ICD-10-CM | POA: Diagnosis not present

## 2020-08-02 DIAGNOSIS — R109 Unspecified abdominal pain: Secondary | ICD-10-CM | POA: Insufficient documentation

## 2020-08-02 DIAGNOSIS — Z8616 Personal history of COVID-19: Secondary | ICD-10-CM | POA: Insufficient documentation

## 2020-08-02 DIAGNOSIS — Z79899 Other long term (current) drug therapy: Secondary | ICD-10-CM | POA: Insufficient documentation

## 2020-08-02 DIAGNOSIS — Z7982 Long term (current) use of aspirin: Secondary | ICD-10-CM | POA: Insufficient documentation

## 2020-08-02 DIAGNOSIS — M47816 Spondylosis without myelopathy or radiculopathy, lumbar region: Secondary | ICD-10-CM | POA: Diagnosis not present

## 2020-08-02 DIAGNOSIS — D7389 Other diseases of spleen: Secondary | ICD-10-CM | POA: Diagnosis not present

## 2020-08-02 DIAGNOSIS — Z7984 Long term (current) use of oral hypoglycemic drugs: Secondary | ICD-10-CM | POA: Insufficient documentation

## 2020-08-02 DIAGNOSIS — Z794 Long term (current) use of insulin: Secondary | ICD-10-CM | POA: Insufficient documentation

## 2020-08-02 DIAGNOSIS — N189 Chronic kidney disease, unspecified: Secondary | ICD-10-CM | POA: Diagnosis not present

## 2020-08-02 DIAGNOSIS — R059 Cough, unspecified: Secondary | ICD-10-CM | POA: Diagnosis not present

## 2020-08-02 DIAGNOSIS — Z20822 Contact with and (suspected) exposure to covid-19: Secondary | ICD-10-CM | POA: Insufficient documentation

## 2020-08-02 DIAGNOSIS — J189 Pneumonia, unspecified organism: Secondary | ICD-10-CM | POA: Insufficient documentation

## 2020-08-02 DIAGNOSIS — E1140 Type 2 diabetes mellitus with diabetic neuropathy, unspecified: Secondary | ICD-10-CM | POA: Diagnosis not present

## 2020-08-02 DIAGNOSIS — I129 Hypertensive chronic kidney disease with stage 1 through stage 4 chronic kidney disease, or unspecified chronic kidney disease: Secondary | ICD-10-CM | POA: Diagnosis not present

## 2020-08-02 DIAGNOSIS — N420 Calculus of prostate: Secondary | ICD-10-CM | POA: Diagnosis not present

## 2020-08-02 DIAGNOSIS — E039 Hypothyroidism, unspecified: Secondary | ICD-10-CM | POA: Insufficient documentation

## 2020-08-02 LAB — URINALYSIS, COMPLETE (UACMP) WITH MICROSCOPIC
Bilirubin Urine: NEGATIVE
Glucose, UA: NEGATIVE mg/dL
Hgb urine dipstick: NEGATIVE
Ketones, ur: NEGATIVE mg/dL
Nitrite: NEGATIVE
Protein, ur: 30 mg/dL — AB
Specific Gravity, Urine: 1.018 (ref 1.005–1.030)
pH: 5 (ref 5.0–8.0)

## 2020-08-02 LAB — COMPREHENSIVE METABOLIC PANEL
ALT: 12 U/L (ref 0–44)
AST: 34 U/L (ref 15–41)
Albumin: 4.1 g/dL (ref 3.5–5.0)
Alkaline Phosphatase: 85 U/L (ref 38–126)
Anion gap: 13 (ref 5–15)
BUN: 11 mg/dL (ref 8–23)
CO2: 25 mmol/L (ref 22–32)
Calcium: 8.8 mg/dL — ABNORMAL LOW (ref 8.9–10.3)
Chloride: 101 mmol/L (ref 98–111)
Creatinine, Ser: 1.18 mg/dL (ref 0.61–1.24)
GFR, Estimated: >60 mL/min (ref >60)
Glucose, Bld: 179 mg/dL — ABNORMAL HIGH (ref 70–99)
Potassium: 4.3 mmol/L (ref 3.5–5.1)
Sodium: 139 mmol/L (ref 135–145)
Total Bilirubin: 1.2 mg/dL (ref 0.3–1.2)
Total Protein: 8.5 g/dL — ABNORMAL HIGH (ref 6.5–8.1)

## 2020-08-02 LAB — RESP PANEL BY RT-PCR (FLU A&B, COVID) ARPGX2
Influenza A by PCR: NEGATIVE
Influenza B by PCR: NEGATIVE
SARS Coronavirus 2 by RT PCR: NEGATIVE

## 2020-08-02 LAB — CBC WITH DIFFERENTIAL/PLATELET
Abs Immature Granulocytes: 0.06 10*3/uL (ref 0.00–0.07)
Basophils Absolute: 0.1 10*3/uL (ref 0.0–0.1)
Basophils Relative: 1 %
Eosinophils Absolute: 0.2 10*3/uL (ref 0.0–0.5)
Eosinophils Relative: 2 %
HCT: 42.7 % (ref 39.0–52.0)
Hemoglobin: 14.5 g/dL (ref 13.0–17.0)
Immature Granulocytes: 1 %
Lymphocytes Relative: 16 %
Lymphs Abs: 1.5 10*3/uL (ref 0.7–4.0)
MCH: 29.1 pg (ref 26.0–34.0)
MCHC: 34 g/dL (ref 30.0–36.0)
MCV: 85.6 fL (ref 80.0–100.0)
Monocytes Absolute: 0.9 10*3/uL (ref 0.1–1.0)
Monocytes Relative: 9 %
Neutro Abs: 7 10*3/uL (ref 1.7–7.7)
Neutrophils Relative %: 71 %
Platelets: 227 10*3/uL (ref 150–400)
RBC: 4.99 MIL/uL (ref 4.22–5.81)
RDW: 13.8 % (ref 11.5–15.5)
WBC: 9.8 10*3/uL (ref 4.0–10.5)
nRBC: 0 % (ref 0.0–0.2)

## 2020-08-02 MED ORDER — AMOXICILLIN-POT CLAVULANATE 875-125 MG PO TABS: 1.0000 | ORAL_TABLET | Freq: Two times a day (BID) | ORAL | 0 refills | Status: AC

## 2020-08-02 MED ORDER — AZITHROMYCIN 250 MG PO TABS: ORAL_TABLET | ORAL | 0 refills | Status: DC

## 2020-08-02 NOTE — ED Triage Notes (Signed)
Patient ambulatory to triage with steady gait, without difficulty or distress noted; pt reports sore throat, hoarseness and nonproc cough since Saturday

## 2020-08-02 NOTE — Discharge Instructions (Signed)
Follow-up with your primary care provider for your pneumonia.  It is suggested by the radiologist that you have a follow-up CT scan to evaluate your lungs in approximately 6 months.  Dr. Dareen Piano can set this up for you as an outpatient.  Begin taking antibiotics until completely finished.  Increase fluids to stay hydrated.  Tylenol or ibuprofen as needed for fever, body aches or headache.  Return to the emergency department if any severe worsening of your symptoms or urgent concerns especially over the holiday weekend.

## 2020-08-02 NOTE — ED Notes (Signed)
Pt is back from CT at this time.  

## 2020-08-02 NOTE — ED Notes (Signed)
Patient transported to CT 

## 2020-08-02 NOTE — ED Provider Notes (Signed)
[facility] Emergency Department Provider Note   ____________________________________________   None    (approximate)  I have reviewed the triage vital signs and the nursing notes.   HISTORY  Chief Complaint Cough   HPI Nathan Wong is a 65 y.o. male presents to the ED with complaint of not feeling well.  Patient states that over the weekend he began feeling bad, but then on Sunday began feeling better and met some friends at the [place].  He states that after that he began feeling bad again.  He complains of sore throat, hoarseness, nonproductive cough.  He denies any nausea, vomiting or diarrhea.  Patient has been unaware of any fever and denies chills.  Patient has a history of hypertension, diabetes type 2 and hypothyroidism.  He reports that he did have COVID and COVID-pneumonia.  He has had Moderna vaccine x2.  He is scheduled to get his third booster next month.         Past Medical History:  Diagnosis Date   Arthritis    Diabetes mellitus without complication ([place])    H/O hernia repair    Hypertension     Patient Active Problem List   Diagnosis Date Noted   Orthostatic hypotension    Acute on chronic respiratory failure with hypoxia (HCC)    Right leg pain    Generalized weakness    Nausea    Epigastric pain    Type 2 diabetes mellitus without complication, with long-term current use of insulin (HCC)    Hyponatremia    Pneumonia due to COVID-19 virus 07/26/2019   Acute kidney injury superimposed on CKD ([place]) 07/26/2019   Acid reflux 05/13/2019   Health care maintenance 11/10/2018   Tachycardia 11/10/2018   Type 2 diabetes mellitus with diabetic neuropathy, unspecified ([place]) 10/22/2018   Essential hypertension 10/22/2018   Hypothyroidism 10/22/2018   Need for dental care 10/22/2018   Pericardial cyst 01/04/2016    Past Surgical History:  Procedure Laterality Date   HERNIA REPAIR     abdominal    Prior to Admission medications    Medication Sig Start Date End Date Taking? Authorizing Provider  amoxicillin-clavulanate (AUGMENTIN) 875-125 MG tablet Take 1 tablet by mouth 2 (two) times daily for 10 days. 08/02/20 08/12/20 Yes Murice Barbar L, PA-C  azithromycin (ZITHROMAX Z-PAK) 250 MG tablet Take 2 tablets (500 mg) on  Day 1,  followed by 1 tablet (250 mg) once daily on Days 2 through 5. 08/02/20  Yes Letitia Neri L, PA-C  acetaminophen (TYLENOL) 500 MG tablet Take 500 mg by mouth every 6 (six) hours as needed for mild pain.     [provider]  aspirin EC 81 MG tablet Take 81 mg by mouth daily.    [provider]  blood glucose meter kit and supplies KIT Dispense based on patient and insurance preference. Use up to four times daily as directed. (FOR ICD-9 250.00, 250.01). 10/24/18   Iloabachie, Chioma E, NP  Blood Pressure KIT 1 kit by Does not apply route daily. 12/08/18   Iloabachie, Chioma E, NP  cholecalciferol (VITAMIN D3) 25 MCG (1000 UT) tablet Take 1,000 Units by mouth daily.    [provider]  diltiazem (CARDIZEM CD) 180 MG 24 hr capsule Take 1 capsule (180 mg total) by mouth daily. 08/01/19 08/31/19  Loletha Grayer, MD  famotidine (PEPCID) 20 MG tablet Take 1 tablet (20 mg total) by mouth daily. 08/02/19   Loletha Grayer, MD  feeding supplement,  ENSURE ENLIVE, (ENSURE ENLIVE) LIQD Take 237 mLs by mouth 2 (two) times daily between meals. 08/01/19   Loletha Grayer, MD  gabapentin (NEURONTIN) 400 MG capsule Take 1 capsule (400 mg total) by mouth 3 (three) times daily. 12/08/18   Iloabachie, Chioma E, NP  insulin degludec (TRESIBA) 200 UNIT/ML FlexTouch Pen Inject 20 Units into the skin at bedtime. 08/01/19   Loletha Grayer, MD  levothyroxine (SYNTHROID) 50 MCG tablet Take 1 tablet (50 mcg total) by mouth daily before breakfast. 05/13/19   Iloabachie, Chioma E, NP  metFORMIN (GLUCOPHAGE) 1000 MG tablet Take 1 tablet (1,000 mg total) by mouth 2 (two) times daily with a meal. 05/13/19    Iloabachie, Chioma E, NP  omeprazole (PRILOSEC) 20 MG capsule Take 1 capsule (20 mg total) by mouth daily. 06/08/19   Iloabachie, Chioma E, NP  zinc sulfate 220 (50 Zn) MG capsule Take 1 capsule (220 mg total) by mouth daily. 08/02/19   Loletha Grayer, MD    Allergies Patient has no known allergies.  Family History  Problem Relation Age of Onset   CAD Mother    Diabetes Sister    Hypertension Sister    Early death Neg Hx     Social History Social History   Tobacco Use   Smoking status: Never   Smokeless tobacco: Never  Vaping Use   Vaping Use: Never used  Substance Use Topics   Alcohol use: No   Drug use: No    Review of Systems Constitutional: No known fever/chills Eyes: No visual changes. ENT: Positive sore throat and hoarseness. Cardiovascular: Denies chest pain. Respiratory: Denies shortness of breath.  Positive nonproductive cough. Gastrointestinal: No abdominal pain.  No nausea, no vomiting.  No diarrhea.   Genitourinary: Negative for dysuria. Musculoskeletal: Negative for musculoskeletal pain. Skin: Negative for rash. Neurological: Negative for headaches, focal weakness or numbness.  ____________________________________________   PHYSICAL EXAM:  VITAL SIGNS: ED Triage Vitals  Enc Vitals Group     BP 08/02/20 0620 (!) 142/86     Pulse Rate 08/02/20 0620 100     Resp 08/02/20 0620 18     Temp 08/02/20 0620 98.4 F (36.9 C)     Temp Source 08/02/20 0620 Oral     SpO2 08/02/20 0620 99 %     Weight 08/02/20 0619 227 lb (103 kg)     Height 08/02/20 0619 5' 9"  (1.753 m)     Head Circumference --      Peak Flow --      Pain Score --      Pain Loc --      Pain Edu? --      Excl. in [place]? --     Constitutional: Alert and oriented. Well appearing and in no acute distress. Eyes: Conjunctivae are normal. PERRL. EOMI. Head: Atraumatic. Nose: No congestion/rhinnorhea. Mouth/Throat: Mucous membranes are moist.  Oropharynx non-erythematous. Neck: No stridor.    Cardiovascular: Normal rate, regular rhythm. Grossly normal heart sounds.  Good peripheral circulation. Respiratory: Normal respiratory effort.  No retractions. Lungs coarse cough.  No wheezing is noted.  There is mild faint rales noted that tends to clear with patient's cough. Gastrointestinal: Soft and nontender. No distention.  Musculoskeletal: No lower extremity tenderness nor edema.  No joint effusions. Neurologic:  Normal speech and language. No gross focal neurologic deficits are appreciated. No gait instability. Skin:  Skin is warm, dry and intact. No rash noted. Psychiatric: Mood and affect are normal. Speech and behavior are normal.  ____________________________________________  LABS (all labs ordered are listed, but only abnormal results are displayed)  Labs Reviewed  COMPREHENSIVE METABOLIC PANEL - Abnormal; Notable for the following components:      Result Value   Glucose, Bld 179 (*)    Calcium 8.8 (*)    Total Protein 8.5 (*)    All other components within normal limits  URINALYSIS, COMPLETE (UACMP) WITH MICROSCOPIC - Abnormal; Notable for the following components:   Color, Urine YELLOW (*)    APPearance HAZY (*)    Protein, ur 30 (*)    Leukocytes,Ua LARGE (*)    Bacteria, UA RARE (*)    All other components within normal limits  RESP PANEL BY RT-PCR (FLU A&B, COVID) ARPGX2  URINE CULTURE  CBC WITH DIFFERENTIAL/PLATELET   ____________________________________________  EKG   ____________________________________________  RADIOLOGY I, Johnn Hai, personally viewed and evaluated these images (plain radiographs) as part of my medical decision making, as well as reviewing the written report by the radiologist.   Official radiology report(s): DG Chest 2 View  Result Date: 08/02/2020 CLINICAL DATA:  65 year old male with cough. EXAM: CHEST - 2 VIEW COMPARISON:  07/30/2019 FINDINGS: The mediastinal contours are within normal limits. No cardiomegaly.  Resolution of previously visualized multifocal bilateral pulmonary opacities. No new opacities, pleural effusion, or pneumothorax. Mild multilevel degenerative changes of the thoracolumbar spine. No acute osseous abnormality. IMPRESSION: Interval resolution of previously visualized multifocal bilateral pulmonary opacities compatible with improving multifocal pneumonia. Electronically Signed   By: Ruthann Cancer MD   On: 08/02/2020 06:48   CT Renal Stone Study  Result Date: 08/02/2020 CLINICAL DATA:  Left flank pain EXAM: CT ABDOMEN AND PELVIS WITHOUT CONTRAST TECHNIQUE: Multidetector CT imaging of the abdomen and pelvis was performed following the standard protocol without oral or IV contrast. COMPARISON:  April 29, 2011 FINDINGS: Lower chest: There are tree on bud type opacities in the lateral segment of the left lower lobe. There is patchy opacity also present in the posterior segment of the right lower lobe. Within this area, there is a nodular opacity on axial slice 23 series 2 measuring 7 x 6 mm. Lung bases otherwise clear Cardiac there are foci of coronary artery calcification at multiple sites. There is thickening of the distal esophageal wall. Hepatobiliary: There is hepatic steatosis. No focal liver lesions are appreciable on this noncontrast enhanced study. Gallbladder wall is not thickened. There is no biliary duct dilatation. Pancreas: There is no pancreatic mass or inflammatory focus. Spleen: No splenic lesions are evident. Small splenule noted inferior to the spleen. Adrenals/Urinary Tract: Adrenals bilaterally appear normal. There is no appreciable renal mass or hydronephrosis on either side. There is no appreciable renal or ureteral calculus on either side. Urinary bladder is midline with wall thickness within normal limits. Stomach/Bowel: There are diverticula scattered throughout the colon without evident diverticulitis. There is moderate stool throughout the colon. There is no bowel wall  thickening. No evident bowel obstruction. Terminal ileum appears normal. Appendix appears normal. There is no appreciable free air or portal venous air. Vascular/Lymphatic: No abdominal aortic aneurysm. There are foci of aortic atherosclerosis. Foci of splenic artery calcification noted. There is no appreciable adenopathy in the abdomen or pelvis. Reproductive: Prostate and seminal vesicles are normal in size and contour. Occasional tiny calculi noted prostate. Other: There is fat in each inguinal ring. No abscess or ascites is evident in the abdomen pelvis. Musculoskeletal: There is lumbar scoliosis. There is degenerative change throughout the lumbar spine. No blastic or lytic  bone lesions are evident. No intramuscular lesions evident. IMPRESSION: 1. Tree on bud type appearance in a portion of the lateral segment left lower lobe consistent with focal pneumonia in this area. 2. 7 x 6 mm nodular opacity with suspected mild infiltrate in the posterior left base. Given this nodular opacity further assessment advised. Non-contrast chest CT at 6-12 months is recommended. If the nodule is stable at time of repeat CT, then future CT at 18-24 months (from today's scan) is considered optional for low-risk patients, but is recommended for high-risk patients. This recommendation follows the consensus statement: Guidelines for Management of Incidental Pulmonary Nodules Detected on CT Images: From the Fleischner Society 2017; Radiology 2017; 284:228-243. 3. No evident renal or ureteral calculus on either side. No hydronephrosis. Urinary bladder wall thickness normal. 4. Scattered colonic diverticula without diverticulitis. No bowel obstruction. No abscess in the abdomen or pelvis. Appendix appears normal. 5.  Aortic Atherosclerosis (ICD10-I70.0). 6.  Hepatic steatosis. Electronically Signed   By: Lowella Grip III M.D.   On: 08/02/2020 10:14    ____________________________________________   PROCEDURES  Procedure(s)  performed (including Critical Care):  Procedures   ____________________________________________   INITIAL IMPRESSION / ASSESSMENT AND PLAN / ED COURSE  As part of my medical decision making, I reviewed the following data within the electronic MEDICAL RECORD NUMBER Notes from prior ED visits and [place] Controlled Substance Database  65 year old male presents to the ED with complaint of cough, congestion and sore throat that began over the weekend.  He states he had 1 day where he felt good and then began feeling worse.  Patient's test for influenza and COVID were negative.  Remaining lab work was reassuring.  Urinalysis did show WBCs 11-20 which was worrisome for a kidney stone with obstruction as patient has a history of kidney stones.  A CT scan was done and no obstruction or hydronephrosis was noted.  We discussed his pneumonia and the need for follow-up and repeat imaging in 6 months.  Patient states that he will follow-up with his PCP and understands that this is important.  A prescription for Augmentin 875 twice daily and Zithromax was sent to his pharmacy.  Patient is encouraged to increase fluids to stay hydrated and take Tylenol as needed for headache, body aches or fever.  Patient is return to the emergency department over the holiday weekend if any worsening of his symptoms such as difficulty breathing or shortness of breath.  Patient O2 sat was 97% at the time of discharge and he was ambulatory without any assistance.   ____________________________________________   FINAL CLINICAL IMPRESSION(S) / ED DIAGNOSES  Final diagnoses:  Community acquired pneumonia, unspecified laterality  Multifocal pneumonia     ED Discharge Orders          Ordered    amoxicillin-clavulanate (AUGMENTIN) 875-125 MG tablet  2 times daily        08/02/20 1057    azithromycin (ZITHROMAX Z-PAK) 250 MG tablet        08/02/20 1057             Note:  This document was prepared using Dragon voice  recognition software and may include unintentional dictation errors.    Johnn Hai, PA-C 08/02/20 1629    Harvest Dark, MD 08/03/20 [phone]

## 2020-08-04 LAB — URINE CULTURE
Culture: NO GROWTH
Special Requests: NORMAL

## 2020-08-09 ENCOUNTER — Other Ambulatory Visit: Payer: Self-pay | Admitting: Family Medicine

## 2020-08-09 DIAGNOSIS — R911 Solitary pulmonary nodule: Secondary | ICD-10-CM

## 2020-08-09 DIAGNOSIS — J189 Pneumonia, unspecified organism: Secondary | ICD-10-CM | POA: Diagnosis not present

## 2020-08-23 DIAGNOSIS — R911 Solitary pulmonary nodule: Secondary | ICD-10-CM | POA: Diagnosis not present

## 2020-08-23 DIAGNOSIS — J189 Pneumonia, unspecified organism: Secondary | ICD-10-CM | POA: Diagnosis not present

## 2020-08-30 DIAGNOSIS — U071 COVID-19: Secondary | ICD-10-CM | POA: Diagnosis not present

## 2020-08-31 ENCOUNTER — Other Ambulatory Visit: Payer: Self-pay | Admitting: Family Medicine

## 2020-08-31 DIAGNOSIS — R911 Solitary pulmonary nodule: Secondary | ICD-10-CM

## 2020-09-30 DIAGNOSIS — U071 COVID-19: Secondary | ICD-10-CM | POA: Diagnosis not present

## 2020-10-11 DIAGNOSIS — E119 Type 2 diabetes mellitus without complications: Secondary | ICD-10-CM | POA: Diagnosis not present

## 2020-10-11 DIAGNOSIS — E039 Hypothyroidism, unspecified: Secondary | ICD-10-CM | POA: Diagnosis not present

## 2020-10-11 DIAGNOSIS — I10 Essential (primary) hypertension: Secondary | ICD-10-CM | POA: Diagnosis not present

## 2020-10-11 DIAGNOSIS — Z23 Encounter for immunization: Secondary | ICD-10-CM | POA: Diagnosis not present

## 2020-10-11 DIAGNOSIS — Z794 Long term (current) use of insulin: Secondary | ICD-10-CM | POA: Diagnosis not present

## 2020-10-31 DIAGNOSIS — U071 COVID-19: Secondary | ICD-10-CM | POA: Diagnosis not present

## 2020-11-29 DIAGNOSIS — Z794 Long term (current) use of insulin: Secondary | ICD-10-CM | POA: Diagnosis not present

## 2020-11-29 DIAGNOSIS — M1A00X0 Idiopathic chronic gout, unspecified site, without tophus (tophi): Secondary | ICD-10-CM | POA: Diagnosis not present

## 2020-11-29 DIAGNOSIS — E039 Hypothyroidism, unspecified: Secondary | ICD-10-CM | POA: Diagnosis not present

## 2020-11-29 DIAGNOSIS — I10 Essential (primary) hypertension: Secondary | ICD-10-CM | POA: Diagnosis not present

## 2020-11-29 DIAGNOSIS — E1140 Type 2 diabetes mellitus with diabetic neuropathy, unspecified: Secondary | ICD-10-CM | POA: Diagnosis not present

## 2020-11-30 DIAGNOSIS — U071 COVID-19: Secondary | ICD-10-CM | POA: Diagnosis not present

## 2020-12-14 DIAGNOSIS — E039 Hypothyroidism, unspecified: Secondary | ICD-10-CM | POA: Diagnosis not present

## 2021-01-09 DIAGNOSIS — R0600 Dyspnea, unspecified: Secondary | ICD-10-CM | POA: Diagnosis not present

## 2021-01-09 DIAGNOSIS — R0609 Other forms of dyspnea: Secondary | ICD-10-CM | POA: Diagnosis not present

## 2021-01-16 ENCOUNTER — Ambulatory Visit: Payer: Medicare HMO | Attending: Family Medicine

## 2021-02-14 DIAGNOSIS — E119 Type 2 diabetes mellitus without complications: Secondary | ICD-10-CM | POA: Diagnosis not present

## 2021-02-14 DIAGNOSIS — E1140 Type 2 diabetes mellitus with diabetic neuropathy, unspecified: Secondary | ICD-10-CM | POA: Diagnosis not present

## 2021-02-14 DIAGNOSIS — I10 Essential (primary) hypertension: Secondary | ICD-10-CM | POA: Diagnosis not present

## 2021-02-14 DIAGNOSIS — E039 Hypothyroidism, unspecified: Secondary | ICD-10-CM | POA: Diagnosis not present

## 2021-02-14 DIAGNOSIS — Z794 Long term (current) use of insulin: Secondary | ICD-10-CM | POA: Diagnosis not present

## 2021-02-15 DIAGNOSIS — E1140 Type 2 diabetes mellitus with diabetic neuropathy, unspecified: Secondary | ICD-10-CM | POA: Diagnosis not present

## 2021-02-15 DIAGNOSIS — E119 Type 2 diabetes mellitus without complications: Secondary | ICD-10-CM | POA: Diagnosis not present

## 2021-02-15 DIAGNOSIS — E039 Hypothyroidism, unspecified: Secondary | ICD-10-CM | POA: Diagnosis not present

## 2021-02-15 DIAGNOSIS — Z794 Long term (current) use of insulin: Secondary | ICD-10-CM | POA: Diagnosis not present

## 2021-02-15 DIAGNOSIS — I10 Essential (primary) hypertension: Secondary | ICD-10-CM | POA: Diagnosis not present

## 2021-06-10 ENCOUNTER — Other Ambulatory Visit: Payer: Self-pay

## 2021-06-10 ENCOUNTER — Emergency Department: Payer: Medicare HMO

## 2021-06-10 ENCOUNTER — Emergency Department
Admission: EM | Admit: 2021-06-10 | Discharge: 2021-06-10 | Disposition: A | Discharge status: Home / Self Care | Payer: Medicare HMO | Attending: Student in an Organized Health Care Education/Training Program | Admitting: Student in an Organized Health Care Education/Training Program

## 2021-06-10 ENCOUNTER — Encounter: Payer: Self-pay | Admitting: Emergency Medicine

## 2021-06-10 DIAGNOSIS — R059 Cough, unspecified: Secondary | ICD-10-CM | POA: Diagnosis not present

## 2021-06-10 DIAGNOSIS — Z20822 Contact with and (suspected) exposure to covid-19: Secondary | ICD-10-CM | POA: Diagnosis not present

## 2021-06-10 DIAGNOSIS — D72829 Elevated white blood cell count, unspecified: Secondary | ICD-10-CM | POA: Diagnosis not present

## 2021-06-10 DIAGNOSIS — E119 Type 2 diabetes mellitus without complications: Secondary | ICD-10-CM | POA: Diagnosis not present

## 2021-06-10 DIAGNOSIS — J209 Acute bronchitis, unspecified: Secondary | ICD-10-CM | POA: Insufficient documentation

## 2021-06-10 DIAGNOSIS — I10 Essential (primary) hypertension: Secondary | ICD-10-CM | POA: Insufficient documentation

## 2021-06-10 DIAGNOSIS — Z8616 Personal history of COVID-19: Secondary | ICD-10-CM | POA: Insufficient documentation

## 2021-06-10 DIAGNOSIS — R6883 Chills (without fever): Secondary | ICD-10-CM | POA: Diagnosis not present

## 2021-06-10 LAB — CBC WITH DIFFERENTIAL/PLATELET
Abs Immature Granulocytes: 0.07 10*3/uL (ref 0.00–0.07)
Basophils Absolute: 0 10*3/uL (ref 0.0–0.1)
Basophils Relative: 0 %
Eosinophils Absolute: 0.1 10*3/uL (ref 0.0–0.5)
Eosinophils Relative: 1 %
HCT: 40.1 % (ref 39.0–52.0)
Hemoglobin: 13 g/dL (ref 13.0–17.0)
Immature Granulocytes: 1 %
Lymphocytes Relative: 14 %
Lymphs Abs: 1.5 10*3/uL (ref 0.7–4.0)
MCH: 27.5 pg (ref 26.0–34.0)
MCHC: 32.4 g/dL (ref 30.0–36.0)
MCV: 84.8 fL (ref 80.0–100.0)
Monocytes Absolute: 0.9 10*3/uL (ref 0.1–1.0)
Monocytes Relative: 8 %
Neutro Abs: 8.3 10*3/uL — ABNORMAL HIGH (ref 1.7–7.7)
Neutrophils Relative %: 76 %
Platelets: 207 10*3/uL (ref 150–400)
RBC: 4.73 MIL/uL (ref 4.22–5.81)
RDW: 13.7 % (ref 11.5–15.5)
WBC: 10.9 10*3/uL — ABNORMAL HIGH (ref 4.0–10.5)
nRBC: 0 % (ref 0.0–0.2)

## 2021-06-10 LAB — COMPREHENSIVE METABOLIC PANEL
ALT: 15 U/L (ref 0–44)
AST: 25 U/L (ref 15–41)
Albumin: 4.1 g/dL (ref 3.5–5.0)
Alkaline Phosphatase: 73 U/L (ref 38–126)
Anion gap: 10 (ref 5–15)
BUN: 13 mg/dL (ref 8–23)
CO2: 27 mmol/L (ref 22–32)
Calcium: 9 mg/dL (ref 8.9–10.3)
Chloride: 101 mmol/L (ref 98–111)
Creatinine, Ser: 0.87 mg/dL (ref 0.61–1.24)
GFR, Estimated: >60 mL/min (ref >60)
Glucose, Bld: 141 mg/dL — ABNORMAL HIGH (ref 70–99)
Potassium: 4 mmol/L (ref 3.5–5.1)
Sodium: 138 mmol/L (ref 135–145)
Total Bilirubin: 1 mg/dL (ref 0.3–1.2)
Total Protein: 7.9 g/dL (ref 6.5–8.1)

## 2021-06-10 LAB — RESP PANEL BY RT-PCR (FLU A&B, COVID) ARPGX2
Influenza A by PCR: NEGATIVE
Influenza B by PCR: NEGATIVE
SARS Coronavirus 2 by RT PCR: NEGATIVE

## 2021-06-10 MED ORDER — BENZONATATE 100 MG PO CAPS: 100.0000 mg | ORAL_CAPSULE | Freq: Three times a day (TID) | ORAL | 0 refills | Status: AC | PRN

## 2021-06-10 MED ORDER — PREDNISONE 10 MG (21) PO TBPK: ORAL_TABLET | ORAL | 0 refills | Status: DC

## 2021-06-10 MED ORDER — IPRATROPIUM-ALBUTEROL 0.5-2.5 (3) MG/3ML IN SOLN
3.0000 mL | Freq: Once | RESPIRATORY_TRACT | Status: AC
  Administered 2021-06-10: 3 mL via RESPIRATORY_TRACT
  Filled 2021-06-10: qty 3

## 2021-06-10 MED ORDER — AMOXICILLIN-POT CLAVULANATE 875-125 MG PO TABS: 1.0000 | ORAL_TABLET | Freq: Two times a day (BID) | ORAL | 0 refills | Status: AC

## 2021-06-10 NOTE — ED Notes (Signed)
Pt states his breathing feels better after neb treatment.  ?

## 2021-06-10 NOTE — ED Provider Notes (Addendum)
? ?[facility] ?Provider Note ? ? ? Event Date/Time  ? First MD Initiated Contact with Patient 06/10/21 1131   ?  (approximate) ? ? ?History  ? ?Cough ? ? ?HPI ? ?Nathan Wong is a 66 y.o. male with history of diabetes, hypertension, arthritis, and covid 2 years ago presents emergency department with fever, chills, body aches and wet cough.  Patient states that he has been sick for about 1-1/2 weeks.  States got worse over the last 3 days.  States this is how it started with COVID.  He was hospitalized when he had COVID 2 years ago. ? ?  ? ? ?Physical Exam  ? ?Triage Vital Signs: ?ED Triage Vitals  ?Enc Vitals Group  ?   BP 06/10/21 1043 128/75  ?   Pulse Rate 06/10/21 1043 100  ?   Resp 06/10/21 1043 17  ?   Temp 06/10/21 1043 98.6 ?F (37 ?C)  ?   Temp Source 06/10/21 1043 Oral  ?   SpO2 06/10/21 1043 93 %  ?   Weight 06/10/21 1023 218 lb (98.9 kg)  ?   Height 06/10/21 1023 5\' 9"  (1.753 m)  ?   Head Circumference --   ?   Peak Flow --   ?   Pain Score 06/10/21 1023 7  ?   Pain Loc --   ?   Pain Edu? --   ?   Excl. in [place]? --   ? ? ?Most recent vital signs: ?Vitals:  ? 06/10/21 1043 06/10/21 1219  ?BP: 128/75 136/77  ?Pulse: 100 (!) 101  ?Resp: 17 16  ?Temp: 98.6 ?F (37 ?C) 98.6 ?F (37 ?C)  ?SpO2: 93% 97%  ? ? ? ?General: Awake, no distress.   ?CV:  Good peripheral perfusion. regular rate and  rhythm ?Resp:  Normal effort. Lungs wheezing noted anteriorly ?Abd:  No distention.   ?Other:    ? ? ?ED Results / Procedures / Treatments  ? ?Labs ?(all labs ordered are listed, but only abnormal results are displayed) ?Labs Reviewed  ?COMPREHENSIVE METABOLIC PANEL - Abnormal; Notable for the following components:  ?    Result Value  ? Glucose, Bld 141 (*)   ? All other components within normal limits  ?CBC WITH DIFFERENTIAL/PLATELET - Abnormal; Notable for the following components:  ? WBC 10.9 (*)   ? Neutro Abs 8.3 (*)   ? All other components within normal limits  ?RESP PANEL BY RT-PCR (FLU A&B,  COVID) ARPGX2  ? ? ? ?EKG ? ?EKG shows normal sinus rhythm, see physician ? ? ?RADIOLOGY ?Chest x-ray ? ? ? ?PROCEDURES: ? ? ?Procedures ? ? ?MEDICATIONS ORDERED IN ED: ?Medications  ?ipratropium-albuterol (DUONEB) 0.5-2.5 (3) MG/3ML nebulizer solution 3 mL (3 mLs Nebulization Given 06/10/21 1143)  ? ? ? ?IMPRESSION / MDM / ASSESSMENT AND PLAN / ED COURSE  ?I reviewed the triage vital signs and the nursing notes. ?             ?               ? ?Differential diagnosis includes, but is not limited to, CAP, COVID, influenza, URI, acute bronchitis ? ?Patient's lab work returned assuring, mild elevation of WBC of 12.9,, chest x-ray was independently reviewed by me and does not show any acute abnormality.  Confirmed by radiology ? ?EKG showed normal sinus rhythm, see physician ? ?I did explain the findings to the patient.  Due to the anterior wheezing  we will give him a DuoNeb while here in the ED.  COVID/influenza test obtained. ? ?Patient was given DuoNeb but states he feels much better with this medication, lungs are clear to auscultation this time, he has good air movement.  Explained to him that I will start him on Augmentin, steroid pack, and Tessalon Perles for the cough.  If he is worsening he is to return emergency department.  I will call him about his COVID test.  He is in agreement treatment plan.  Discharged stable condition. ? ?----------------------------------------- ?1:38 PM on 06/10/2021 ?----------------------------------------- ?I tried to call patient to notify him of his negative COVID result.  Call is unable to be completed.  I did try this 3 times without any results. ?  ? ? ?FINAL CLINICAL IMPRESSION(S) / ED DIAGNOSES  ? ?Final diagnoses:  ?Acute bronchitis, unspecified organism  ? ? ? ?Rx / DC Orders  ? ?ED Discharge Orders   ? ?      Ordered  ?  amoxicillin-clavulanate (AUGMENTIN) 875-125 MG tablet  2 times daily       ? 06/10/21 1215  ?  predniSONE (STERAPRED UNI-PAK 21 TAB) 10 MG (21) TBPK  tablet       ? 06/10/21 1215  ?  benzonatate (TESSALON PERLES) 100 MG capsule  3 times daily PRN       ? 06/10/21 1215  ? ?  ?  ? ?  ? ? ? ?Note:  This document was prepared using Dragon voice recognition software and may include unintentional dictation errors. ? ?  ?Faythe Ghee, PA-C ?06/10/21 1217 ? ?  ?Faythe Ghee, PA-C ?06/10/21 1339 ? ?  ?Willy Eddy, MD ?06/10/21 1402 ? ?

## 2021-06-10 NOTE — ED Triage Notes (Signed)
Pt reports for the past week and a half he has had a cough that hurts his chest although not productive, chills, muscle aches and just doesn't feel well. Pt reports it started with a sore throat and now he feels like he may have the flu. Pt denies recent exposures ?

## 2021-06-10 NOTE — Discharge Instructions (Signed)
Follow-up with your regular doctor if not improving 3 days.  Return emergency department worsening.  Take medications as prescribed 

## 2021-06-18 DIAGNOSIS — Z0000 Encounter for general adult medical examination without abnormal findings: Secondary | ICD-10-CM | POA: Diagnosis not present

## 2021-06-18 DIAGNOSIS — I10 Essential (primary) hypertension: Secondary | ICD-10-CM | POA: Diagnosis not present

## 2021-06-18 DIAGNOSIS — Z23 Encounter for immunization: Secondary | ICD-10-CM | POA: Diagnosis not present

## 2021-06-18 DIAGNOSIS — Z794 Long term (current) use of insulin: Secondary | ICD-10-CM | POA: Diagnosis not present

## 2021-06-18 DIAGNOSIS — E039 Hypothyroidism, unspecified: Secondary | ICD-10-CM | POA: Diagnosis not present

## 2021-06-18 DIAGNOSIS — E1140 Type 2 diabetes mellitus with diabetic neuropathy, unspecified: Secondary | ICD-10-CM | POA: Diagnosis not present

## 2021-08-14 DIAGNOSIS — H25013 Cortical age-related cataract, bilateral: Secondary | ICD-10-CM | POA: Diagnosis not present

## 2021-08-14 DIAGNOSIS — H2513 Age-related nuclear cataract, bilateral: Secondary | ICD-10-CM | POA: Diagnosis not present

## 2021-08-14 DIAGNOSIS — E119 Type 2 diabetes mellitus without complications: Secondary | ICD-10-CM | POA: Diagnosis not present

## 2021-08-15 DIAGNOSIS — G8929 Other chronic pain: Secondary | ICD-10-CM | POA: Diagnosis not present

## 2021-08-15 DIAGNOSIS — M170 Bilateral primary osteoarthritis of knee: Secondary | ICD-10-CM | POA: Diagnosis not present

## 2021-08-15 DIAGNOSIS — M25561 Pain in right knee: Secondary | ICD-10-CM | POA: Diagnosis not present

## 2021-08-15 DIAGNOSIS — M25562 Pain in left knee: Secondary | ICD-10-CM | POA: Diagnosis not present

## 2021-09-01 IMAGING — CT CT RENAL STONE PROTOCOL
2 of 4 series · 15 of 46 positions shown, 17 images · non-contrast
Comparison: April 29, 2011

CLINICAL DATA: Left flank pain

EXAM:
CT ABDOMEN AND PELVIS WITHOUT CONTRAST
TECHNIQUE: Multidetector CT imaging of the abdomen and pelvis was performed
following the standard protocol without oral or IV contrast.

[Series 4: stone full standard · axial · 0.78mm/px · z∈[-998,-518]mm · 12 of 106 slices shown, 14 images]
[im 5/106  soft-tissue]
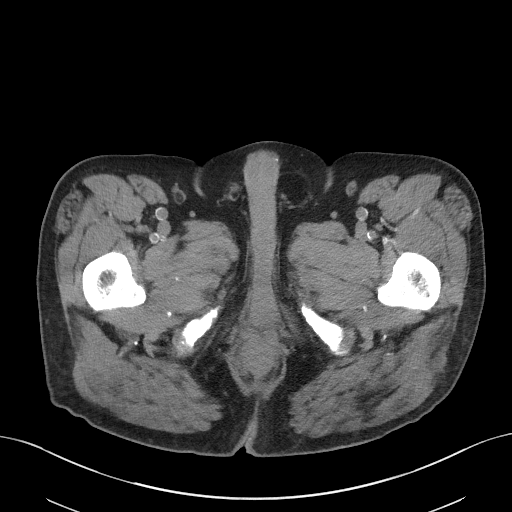
[im 5/106  bone]
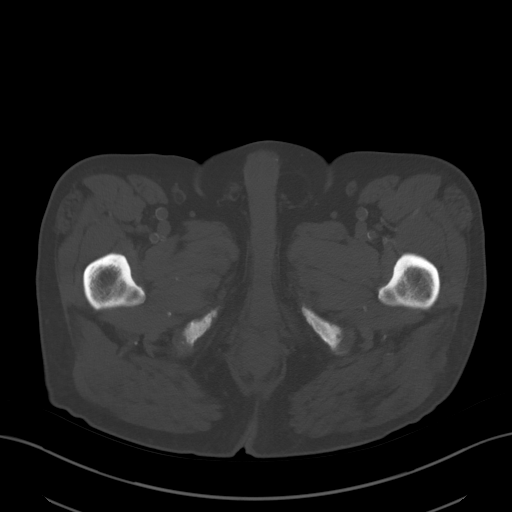
[im 15/106  soft-tissue]
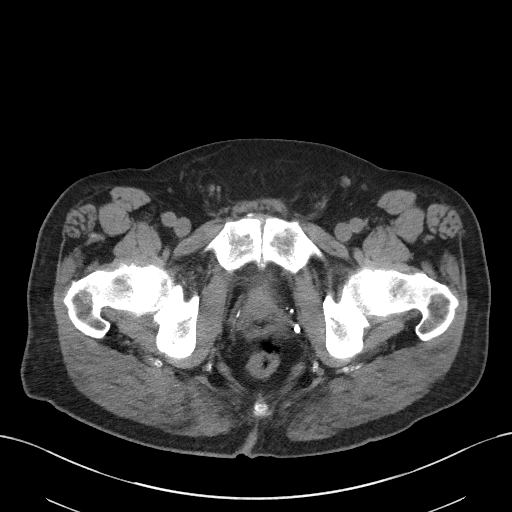
[im 24/106  soft-tissue]
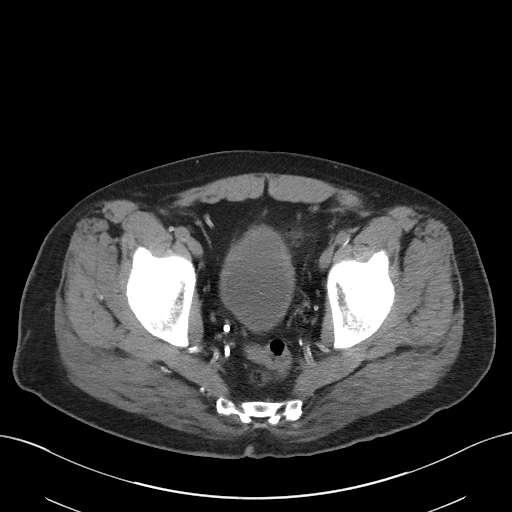
[im 34/106  soft-tissue]
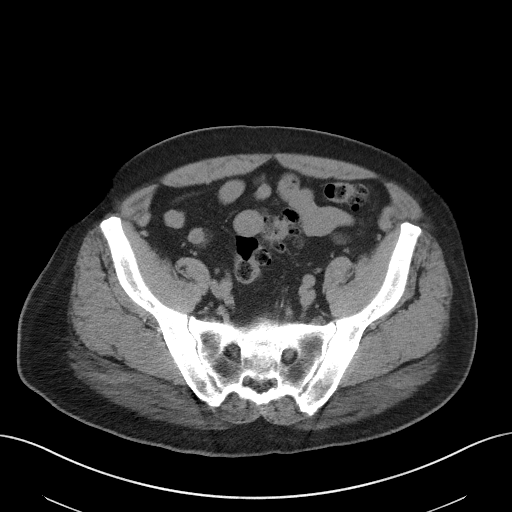
[im 39/106  soft-tissue]
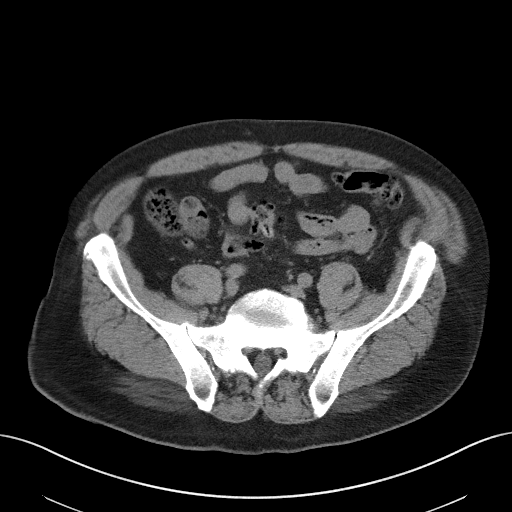
[im 48/106  soft-tissue]
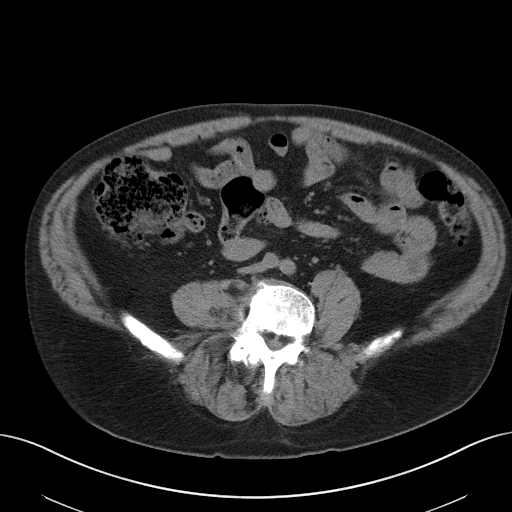
[im 58/106  soft-tissue]
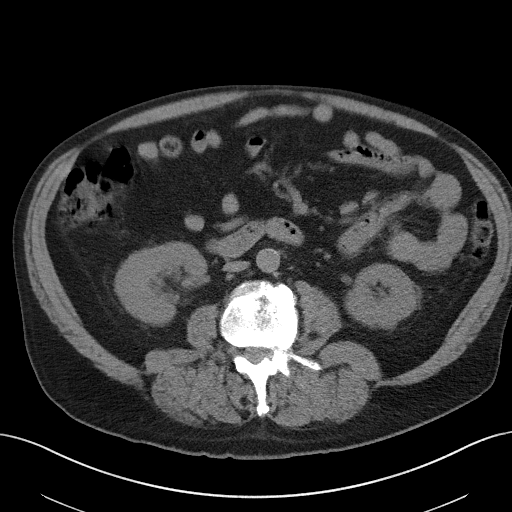
[im 67/106  soft-tissue]
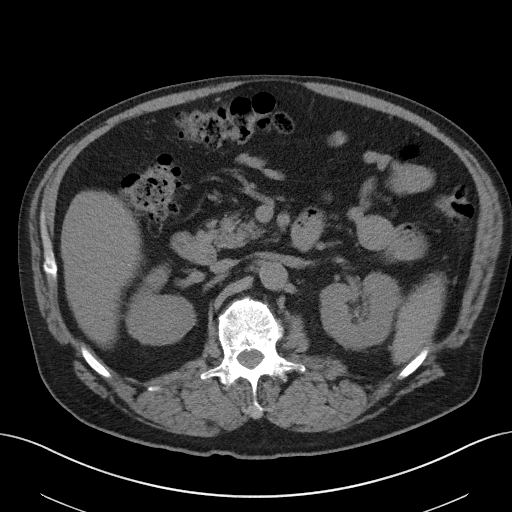
[im 72/106  soft-tissue]
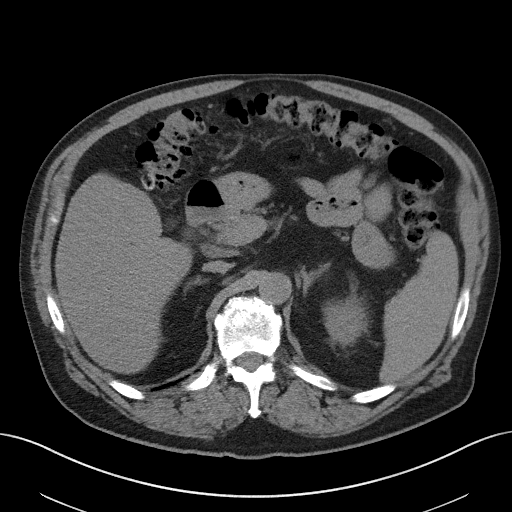
[im 72/106  bone]
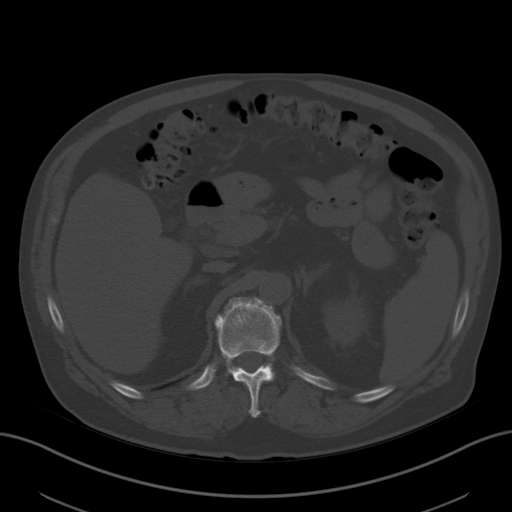
[im 82/106  soft-tissue]
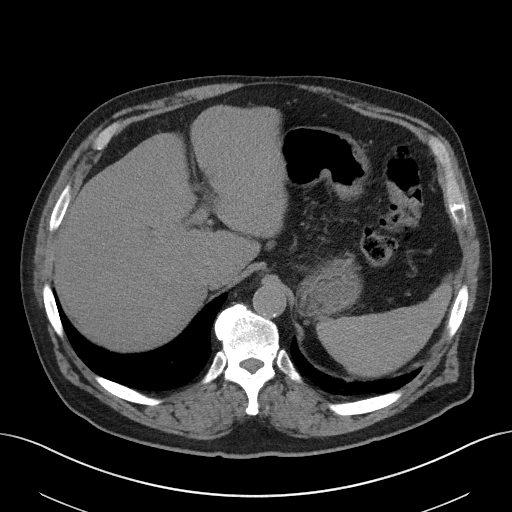
[im 91/106  soft-tissue]
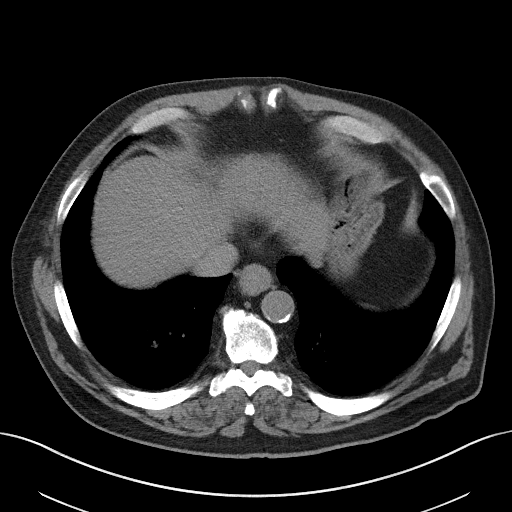
[im 101/106  soft-tissue]
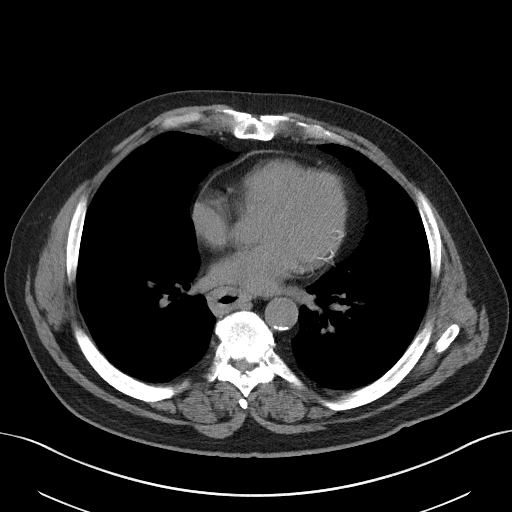

[Series 5: coronal · coronal · 0.80mm/px · 3 of 161 slices shown]
[im 54/161  soft-tissue]
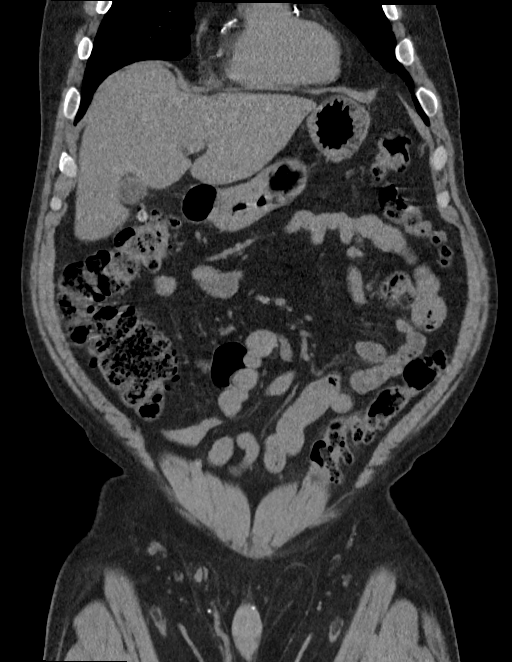
[im 72/161  soft-tissue]
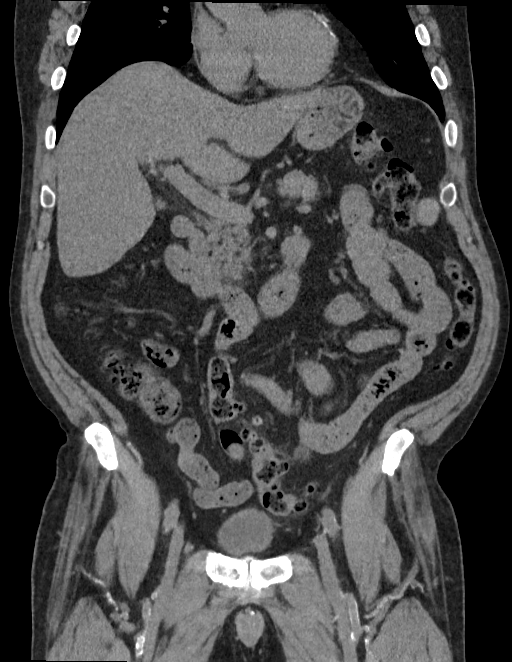
[im 89/161  soft-tissue]
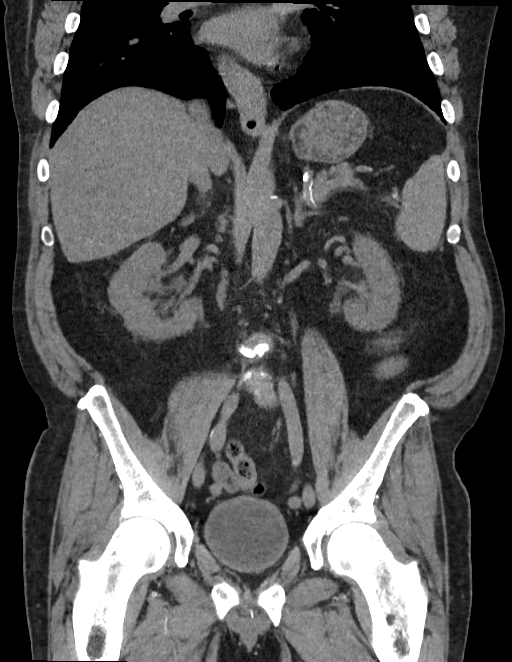

[15 of 46 positions shown; findings below may reference images not displayed]

FINDINGS: Lower chest: There are tree on Mil[place] type opacities in the lateral
segment of the left lower lobe. There is patchy opacity also present
in the posterior segment of the right lower lobe. Within this area,
there is a nodular opacity on axial slice 23 series 2 measuring 7 x
6 mm. Lung bases otherwise clear Cardiac there are foci of coronary
artery calcification at multiple sites. There is thickening of the
distal esophageal wall.

Hepatobiliary: There is hepatic steatosis. No focal liver lesions
are appreciable on this noncontrast enhanced study. Gallbladder wall
is not thickened. There is no biliary duct dilatation.

Pancreas: There is no pancreatic mass or inflammatory focus.

Spleen: No splenic lesions are evident. Small splenule noted
inferior to the spleen.

Adrenals/Urinary Tract: Adrenals bilaterally appear normal. There is
no appreciable renal mass or hydronephrosis on either side. There is
no appreciable renal or ureteral calculus on either side. Urinary
bladder is midline with wall thickness within normal limits.

Stomach/Bowel: There are diverticula scattered throughout the colon
without evident diverticulitis. There is moderate stool throughout
the colon. There is no bowel wall thickening. No evident bowel
obstruction. Terminal ileum appears normal. Appendix appears normal.
There is no appreciable free air or portal venous air.

Vascular/Lymphatic: No abdominal aortic aneurysm. There are foci of
aortic atherosclerosis. Foci of splenic artery calcification noted.
There is no appreciable adenopathy in the abdomen or pelvis.

Reproductive: Prostate and seminal vesicles are normal in size and
contour. Occasional tiny calculi noted prostate.

Other: There is fat in each inguinal ring. No abscess or ascites is
evident in the abdomen pelvis.

Musculoskeletal: There is lumbar scoliosis. There is degenerative
change throughout the lumbar spine. No blastic or lytic bone lesions
are evident. No intramuscular lesions evident.
IMPRESSION: 1. Tree on [place] type appearance in a portion of the lateral segment
left lower lobe consistent with focal pneumonia in this area.

2. 7 x 6 mm nodular opacity with suspected mild infiltrate in the
posterior left base. Given this nodular opacity further assessment
advised. Non-contrast chest CT at 6-12 months is recommended. If the
nodule is stable at time of repeat CT, then future CT at 18-24
months (from today's scan) is considered optional for low-risk
patients, but is recommended for high-risk patients. This
recommendation follows the consensus statement: Guidelines for
Management of Incidental Pulmonary Nodules Detected on CT Images:

3. No evident renal or ureteral calculus on either side. No
hydronephrosis. Urinary bladder wall thickness normal.

4. Scattered colonic diverticula without diverticulitis. No bowel
obstruction. No abscess in the abdomen or pelvis. Appendix appears
normal.

5.  Aortic Atherosclerosis ([id number]).

6.  Hepatic steatosis.

## 2021-12-19 DIAGNOSIS — E039 Hypothyroidism, unspecified: Secondary | ICD-10-CM | POA: Diagnosis not present

## 2021-12-19 DIAGNOSIS — Z794 Long term (current) use of insulin: Secondary | ICD-10-CM | POA: Diagnosis not present

## 2021-12-19 DIAGNOSIS — I10 Essential (primary) hypertension: Secondary | ICD-10-CM | POA: Diagnosis not present

## 2021-12-19 DIAGNOSIS — E1140 Type 2 diabetes mellitus with diabetic neuropathy, unspecified: Secondary | ICD-10-CM | POA: Diagnosis not present

## 2021-12-19 DIAGNOSIS — Z23 Encounter for immunization: Secondary | ICD-10-CM | POA: Diagnosis not present

## 2022-04-23 DIAGNOSIS — I10 Essential (primary) hypertension: Secondary | ICD-10-CM | POA: Diagnosis not present

## 2022-04-23 DIAGNOSIS — E1140 Type 2 diabetes mellitus with diabetic neuropathy, unspecified: Secondary | ICD-10-CM | POA: Diagnosis not present

## 2022-04-23 DIAGNOSIS — E039 Hypothyroidism, unspecified: Secondary | ICD-10-CM | POA: Diagnosis not present

## 2022-04-23 DIAGNOSIS — M1A00X0 Idiopathic chronic gout, unspecified site, without tophus (tophi): Secondary | ICD-10-CM | POA: Diagnosis not present

## 2022-04-23 DIAGNOSIS — Z794 Long term (current) use of insulin: Secondary | ICD-10-CM | POA: Diagnosis not present

## 2022-05-11 ENCOUNTER — Emergency Department: Payer: Medicare HMO

## 2022-05-11 ENCOUNTER — Emergency Department
Admission: EM | Admit: 2022-05-11 | Discharge: 2022-05-11 | Disposition: A | Discharge status: Home / Self Care | Payer: Medicare HMO | Attending: Emergency Medicine | Admitting: Emergency Medicine

## 2022-05-11 ENCOUNTER — Other Ambulatory Visit: Payer: Self-pay

## 2022-05-11 DIAGNOSIS — R0789 Other chest pain: Secondary | ICD-10-CM | POA: Diagnosis not present

## 2022-05-11 DIAGNOSIS — Z8616 Personal history of COVID-19: Secondary | ICD-10-CM | POA: Insufficient documentation

## 2022-05-11 DIAGNOSIS — N189 Chronic kidney disease, unspecified: Secondary | ICD-10-CM | POA: Diagnosis not present

## 2022-05-11 DIAGNOSIS — E1140 Type 2 diabetes mellitus with diabetic neuropathy, unspecified: Secondary | ICD-10-CM | POA: Diagnosis not present

## 2022-05-11 DIAGNOSIS — I129 Hypertensive chronic kidney disease with stage 1 through stage 4 chronic kidney disease, or unspecified chronic kidney disease: Secondary | ICD-10-CM | POA: Diagnosis not present

## 2022-05-11 DIAGNOSIS — E039 Hypothyroidism, unspecified: Secondary | ICD-10-CM | POA: Diagnosis not present

## 2022-05-11 DIAGNOSIS — R079 Chest pain, unspecified: Secondary | ICD-10-CM | POA: Diagnosis present

## 2022-05-11 DIAGNOSIS — Z0389 Encounter for observation for other suspected diseases and conditions ruled out: Secondary | ICD-10-CM | POA: Diagnosis not present

## 2022-05-11 MED ORDER — ACETAMINOPHEN 325 MG PO TABS
650.0000 mg | ORAL_TABLET | Freq: Once | ORAL | Status: AC
  Administered 2022-05-11: 650 mg via ORAL
  Filled 2022-05-11: qty 2

## 2022-05-11 MED ORDER — LIDOCAINE 5 % EX PTCH: 1.0000 | MEDICATED_PATCH | Freq: Two times a day (BID) | CUTANEOUS | 0 refills | Status: AC

## 2022-05-11 MED ORDER — LIDOCAINE 5 % EX PTCH
1.0000 | MEDICATED_PATCH | CUTANEOUS | Status: DC
  Administered 2022-05-11: 1 via TRANSDERMAL
  Filled 2022-05-11: qty 1

## 2022-05-11 NOTE — Discharge Instructions (Signed)
Your xiphoid process is protuberant as demonstrated on your x-ray which was shown to you.  This is likely causing some irritation.  You may take the medication as prescribed.  Please return if you develop any skin irritation or changes, trouble breathing, abdominal pain, or any other new, worsening, or change in symptoms or other concerns.  It was a pleasure caring for you today.

## 2022-05-11 NOTE — ED Triage Notes (Signed)
Pt c/o RUQ mass that is tender to palpation. Pt denies GI complaints, N/V/D, CP, SHOB. No redness or swelling noted, mass is noticeable to palpation.

## 2022-05-11 NOTE — ED Provider Notes (Signed)
[facility] Provider Note    Event Date/Time   First MD Initiated Contact with Patient 05/11/22 1211     (approximate)   History   Mass   HPI  Nathan Wong is a 67 y.o. male who presents today for evaluation of tenderness over his xiphoid process.  He feels that it is more protuberant than usual.  He denies any injuries here.  He denies any trouble breathing.  No abdominal pain.  No nausea or vomiting.  He has not noticed any wounds to this area.  He is just wondering what this is.  Patient Active Problem List   Diagnosis Date Noted   Orthostatic hypotension    Acute on chronic respiratory failure with hypoxia    Right leg pain    Generalized weakness    Nausea    Epigastric pain    Type 2 diabetes mellitus without complication, with long-term current use of insulin    Hyponatremia    Pneumonia due to COVID-19 virus 07/26/2019   Acute kidney injury superimposed on CKD 07/26/2019   Acid reflux 05/13/2019   Health care maintenance 11/10/2018   Tachycardia 11/10/2018   Type 2 diabetes mellitus with diabetic neuropathy, unspecified 10/22/2018   Essential hypertension 10/22/2018   Hypothyroidism 10/22/2018   Need for dental care 10/22/2018   Pericardial cyst 01/04/2016          Physical Exam   Triage Vital Signs: ED Triage Vitals  Enc Vitals Group     BP 05/11/22 1150 (!) 144/85     Pulse Rate 05/11/22 1150 96     Resp 05/11/22 1150 18     Temp 05/11/22 1150 97.7 F (36.5 C)     Temp Source 05/11/22 1150 Oral     SpO2 05/11/22 1150 98 %     Weight 05/11/22 1150 187 lb (84.8 kg)     Height 05/11/22 1150 5\' 8"  (1.727 m)     Head Circumference --      Peak Flow --      Pain Score 05/11/22 1158 2     Pain Loc --      Pain Edu? --      Excl. in GC? --     Most recent vital signs: Vitals:   05/11/22 1150  BP: (!) 144/85  Pulse: 96  Resp: 18  Temp: 97.7 F (36.5 C)  SpO2: 98%    Physical Exam Vitals and nursing note  reviewed.  Constitutional:      General: Awake and alert. No acute distress.    Appearance: Normal appearance. The patient is normal weight.  HENT:     Head: Normocephalic and atraumatic.     Mouth: Mucous membranes are moist.  Eyes:     General: PERRL. Normal EOMs        Right eye: No discharge.        Left eye: No discharge.     Conjunctiva/sclera: Conjunctivae normal.  Cardiovascular:     Rate and Rhythm: Normal rate and regular rhythm.     Pulses: Normal pulses.  Pulmonary:     Effort: Pulmonary effort is normal. No respiratory distress.     Breath sounds: Normal breath sounds.  TTP with protuberance at the xiphoid process, no open wounds. No erythema, ecchymosis, or wounds noted. No stepoffs or crepitus Abdominal:     Abdomen is soft. There is no abdominal tenderness. No rebound or guarding. No distention. Musculoskeletal:  General: No swelling. Normal range of motion.     Cervical back: Normal range of motion and neck supple.  Skin:    General: Skin is warm and dry.     Capillary Refill: Capillary refill takes less than 2 seconds.     Findings: No rash.  Neurological:     Mental Status: The patient is awake and alert.      ED Results / Procedures / Treatments   Labs (all labs ordered are listed, but only abnormal results are displayed) Labs Reviewed - No data to display   EKG     RADIOLOGY I independently reviewed and interpreted imaging and agree with radiologists findings.     PROCEDURES:  Critical Care performed:   Procedures   MEDICATIONS ORDERED IN ED: Medications  lidocaine (LIDODERM) 5 % 1 patch (1 patch Transdermal Patch Applied 05/11/22 1243)  acetaminophen (TYLENOL) tablet 650 mg (650 mg Oral Given 05/11/22 1234)     IMPRESSION / MDM / ASSESSMENT AND PLAN / ED COURSE  I reviewed the triage vital signs and the nursing notes.   Differential diagnosis includes, but is not limited to, xiphoid process fracture, lipoma, tinea, chest  wall pain, costochondritis.  Patient is awake and alert, hemodynamically stable and afebrile.  He is a normal oxygen saturation of 98% on room air and demonstrates no increased work of breathing.  There are no overlying skin changes.  There is no fluctuant or indurated mass.  Mass that he feels feels bony in nature.  There is no overlying erythema or ecchymosis.  There is no crepitus.  Signs and symptoms are not consistent with infectious etiology.  He has no shortness of breath or abdominal discomfort.  No constitutional symptoms.  X-ray obtained demonstrates a chronic ventral curvature of the tip of the xiphoid process which is exactly the location of his protuberance, and per the radiologist is likely a normal variant and unchanged since 2021.  I reviewed these x-rays with the patient and he is reassured by these findings.  He was treated symptomatically with Lidoderm patch and Tylenol with significant improvement of his symptoms.  We discussed strict return precautions for any changes of the skin, worsening pain or swelling, or any other changes or concerns.  Recommended close outpatient follow-up.  Patient or stands and agrees with plan.  He was discharged in stable condition.   Patient's presentation is most consistent with acute complicated illness / injury requiring diagnostic workup.    FINAL CLINICAL IMPRESSION(S) / ED DIAGNOSES   Final diagnoses:  Chest wall pain     Rx / DC Orders   ED Discharge Orders          Ordered    lidocaine (LIDODERM) 5 %  Every 12 hours        05/11/22 1241             Note:  This document was prepared using Dragon voice recognition software and may include unintentional dictation errors.   Jackelyn Hoehn, PA-C 05/11/22 1451    Merwyn Katos, MD 05/11/22 [phone]

## 2022-05-15 ENCOUNTER — Telehealth: Payer: Self-pay | Admitting: *Deleted

## 2022-05-15 NOTE — Telephone Encounter (Signed)
     Patient  visit on 05/11/2022  at [facility]  was for treatment   Have you been able to follow up with your primary care physician? Still having pain but is still swollen is going to follow up with pcp  The patient was  able to obtain any needed medicine or equipment.  Are there diet recommendations that you are having difficulty following?  Patient expresses understanding of discharge instructions and education provided has no other needs at this time. yes   Alois Cliche -Berneda Rose [facility] [place], Population [facility] [phone] [address] [place] , [place] [address] Email : [email or web address]

## 2022-07-09 ENCOUNTER — Emergency Department: Payer: Medicare HMO

## 2022-07-09 ENCOUNTER — Encounter: Payer: Self-pay | Admitting: Emergency Medicine

## 2022-07-09 ENCOUNTER — Emergency Department
Admission: EM | Admit: 2022-07-09 | Discharge: 2022-07-09 | Disposition: A | Discharge status: Home / Self Care | Payer: Medicare HMO | Attending: Emergency Medicine | Admitting: Emergency Medicine

## 2022-07-09 ENCOUNTER — Other Ambulatory Visit: Payer: Self-pay

## 2022-07-09 DIAGNOSIS — E039 Hypothyroidism, unspecified: Secondary | ICD-10-CM | POA: Diagnosis not present

## 2022-07-09 DIAGNOSIS — S86911A Strain of unspecified muscle(s) and tendon(s) at lower leg level, right leg, initial encounter: Secondary | ICD-10-CM | POA: Diagnosis not present

## 2022-07-09 DIAGNOSIS — I10 Essential (primary) hypertension: Secondary | ICD-10-CM | POA: Insufficient documentation

## 2022-07-09 DIAGNOSIS — M79661 Pain in right lower leg: Secondary | ICD-10-CM | POA: Diagnosis not present

## 2022-07-09 DIAGNOSIS — M79604 Pain in right leg: Secondary | ICD-10-CM | POA: Diagnosis not present

## 2022-07-09 DIAGNOSIS — M25551 Pain in right hip: Secondary | ICD-10-CM | POA: Insufficient documentation

## 2022-07-09 DIAGNOSIS — T148XXA Other injury of unspecified body region, initial encounter: Secondary | ICD-10-CM

## 2022-07-09 LAB — COMPREHENSIVE METABOLIC PANEL
ALT: 12 U/L (ref 0–44)
AST: 27 U/L (ref 15–41)
Albumin: 3.9 g/dL (ref 3.5–5.0)
Alkaline Phosphatase: 64 U/L (ref 38–126)
Anion gap: 12 (ref 5–15)
BUN: 13 mg/dL (ref 8–23)
CO2: 25 mmol/L (ref 22–32)
Calcium: 8.9 mg/dL (ref 8.9–10.3)
Chloride: 105 mmol/L (ref 98–111)
Creatinine, Ser: 0.85 mg/dL (ref 0.61–1.24)
GFR, Estimated: >60 mL/min (ref >60)
Glucose, Bld: 166 mg/dL — ABNORMAL HIGH (ref 70–99)
Potassium: 3.5 mmol/L (ref 3.5–5.1)
Sodium: 142 mmol/L (ref 135–145)
Total Bilirubin: 0.7 mg/dL (ref 0.3–1.2)
Total Protein: 6.9 g/dL (ref 6.5–8.1)

## 2022-07-09 LAB — URINALYSIS, ROUTINE W REFLEX MICROSCOPIC
Bilirubin Urine: NEGATIVE
Glucose, UA: >=500 mg/dL — AB
Hgb urine dipstick: NEGATIVE
Ketones, ur: NEGATIVE mg/dL
Nitrite: NEGATIVE
Protein, ur: NEGATIVE mg/dL
Specific Gravity, Urine: 1.028 (ref 1.005–1.030)
pH: 5 (ref 5.0–8.0)

## 2022-07-09 LAB — CBC WITH DIFFERENTIAL/PLATELET
Abs Immature Granulocytes: 0.01 10*3/uL (ref 0.00–0.07)
Basophils Absolute: 0.1 10*3/uL (ref 0.0–0.1)
Basophils Relative: 1 %
Eosinophils Absolute: 0.1 10*3/uL (ref 0.0–0.5)
Eosinophils Relative: 2 %
HCT: 44.8 % (ref 39.0–52.0)
Hemoglobin: 14.5 g/dL (ref 13.0–17.0)
Immature Granulocytes: 0 %
Lymphocytes Relative: 23 %
Lymphs Abs: 1.2 10*3/uL (ref 0.7–4.0)
MCH: 28.6 pg (ref 26.0–34.0)
MCHC: 32.4 g/dL (ref 30.0–36.0)
MCV: 88.4 fL (ref 80.0–100.0)
Monocytes Absolute: 0.5 10*3/uL (ref 0.1–1.0)
Monocytes Relative: 9 %
Neutro Abs: 3.5 10*3/uL (ref 1.7–7.7)
Neutrophils Relative %: 65 %
Platelets: 124 10*3/uL — ABNORMAL LOW (ref 150–400)
RBC: 5.07 MIL/uL (ref 4.22–5.81)
RDW: 13.9 % (ref 11.5–15.5)
Smear Review: NORMAL
WBC: 5.4 10*3/uL (ref 4.0–10.5)
nRBC: 0 % (ref 0.0–0.2)

## 2022-07-09 LAB — CK: Total CK: 83 U/L (ref 49–397)

## 2022-07-09 MED ORDER — MELOXICAM 7.5 MG PO TABS: 7.5000 mg | ORAL_TABLET | Freq: Every day | ORAL | 0 refills | Status: AC

## 2022-07-09 MED ORDER — BACLOFEN 10 MG PO TABS: 10.0000 mg | ORAL_TABLET | Freq: Three times a day (TID) | ORAL | 0 refills | Status: AC

## 2022-07-09 NOTE — Discharge Instructions (Signed)
Your ultrasound for blood clot is negative.   Your blood work is negative Follow-up with your regular doctor if not improving 3 days Return if worsening

## 2022-07-09 NOTE — ED Triage Notes (Signed)
Pt via POV from home. Pt c/o R foot pain that radiates up his R leg, states it started ever since he was working on a deck this weekend. Denies any wound to the R foot but states he does have a hx of DM. States that pain is worse in the morning and sharp in nature.

## 2022-07-09 NOTE — ED Provider Notes (Signed)
[facility] Provider Note    Event Date/Time   First MD Initiated Contact with Patient 07/09/22 [phone]     (approximate)   History   Foot Pain   HPI  Nathan Wong is a 67 y.o. male with history of type Beatties, hypertension, hyponatremia, hypothyroidism presents emergency department with complaints of right leg pain.  Patient was working outside in the heat this weekend tearing down a deck.  No known injury at the time.  She is continue to have right hip leg and muscle pain.  States it just hurts more with movement.  No chest pain/shortness of breath.  Patient is also concerned he might have a blood clot.      Physical Exam   Triage Vital Signs: ED Triage Vitals  Enc Vitals Group     BP 07/09/22 0921 (!) 151/85     Pulse Rate 07/09/22 0921 81     Resp 07/09/22 0921 17     Temp 07/09/22 0921 98 F (36.7 C)     Temp src --      SpO2 07/09/22 0921 100 %     Weight 07/09/22 0916 187 lb (84.8 kg)     Height 07/09/22 0916 5\' 8"  (1.727 m)     Head Circumference --      Peak Flow --      Pain Score 07/09/22 0916 8     Pain Loc --      Pain Edu? --      Excl. in GC? --     Most recent vital signs: Vitals:   07/09/22 0921  BP: (!) 151/85  Pulse: 81  Resp: 17  Temp: 98 F (36.7 C)  SpO2: 100%     General: Awake, no distress.   CV:  Good peripheral perfusion. regular rate and  rhythm Resp:  Normal effort.  Abd:  No distention.   Other:  Muscles of the entire right leg are tender, calf is tender, posterior knee is tender, quads are tender to palpation, neurovascular is intact, no redness to indicate infection, minimal swelling   ED Results / Procedures / Treatments   Labs (all labs ordered are listed, but only abnormal results are displayed) Labs Reviewed  COMPREHENSIVE METABOLIC PANEL - Abnormal; Notable for the following components:      Result Value   Glucose, Bld 166 (*)    All other components within normal limits  CBC WITH  DIFFERENTIAL/PLATELET - Abnormal; Notable for the following components:   Platelets 124 (*)    All other components within normal limits  CK  URINALYSIS, ROUTINE W REFLEX MICROSCOPIC     EKG     RADIOLOGY Ultrasound right lower extremity    PROCEDURES:   Procedures   MEDICATIONS ORDERED IN ED: Medications - No data to display   IMPRESSION / MDM / ASSESSMENT AND PLAN / ED COURSE  I reviewed the triage vital signs and the nursing notes.                              Differential diagnosis includes, but is not limited to, bursitis, cellulitis, DVT, rhabdomyolysis, muscle strain  Patient's presentation is most consistent with acute presentation with potential threat to life or bodily function.   Due to the patient working in the heat over the weekend with pain becoming worse and will check him for rhabdomyolysis.  Labs and imaging for DVT ordered   Labs  are reassuring Ultrasound independently reviewed and interpreted by me as being negative for any acute abnormality  I did explain the findings to the patient.  He is to take Tylenol for pain.  Meloxicam 7.5 mg daily for 5 days.  Baclofen for the muscle strain.  Return emergency department worsening.  See his regular doctor if not improving in 3 days.  Patient is in agreement treatment plan.  He was discharged stable condition   FINAL CLINICAL IMPRESSION(S) / ED DIAGNOSES   Final diagnoses:  Muscle strain     Rx / DC Orders   ED Discharge Orders          Ordered    meloxicam (MOBIC) 7.5 MG tablet  Daily        07/09/22 1046    baclofen (LIORESAL) 10 MG tablet  3 times daily        07/09/22 1046             Note:  This document was prepared using Dragon voice recognition software and may include unintentional dictation errors.    Faythe Ghee, PA-C 07/09/22 1048    Jene Every, MD 07/09/22 1128

## 2022-07-10 IMAGING — CR DG CHEST 2V
2 series · 2 of 2 positions shown · non-contrast
Comparison: 08/02/2020

CLINICAL DATA: Cough with chills and muscle aches 1-2 weeks.

EXAM:
CHEST - 2 VIEW

[chest pa]
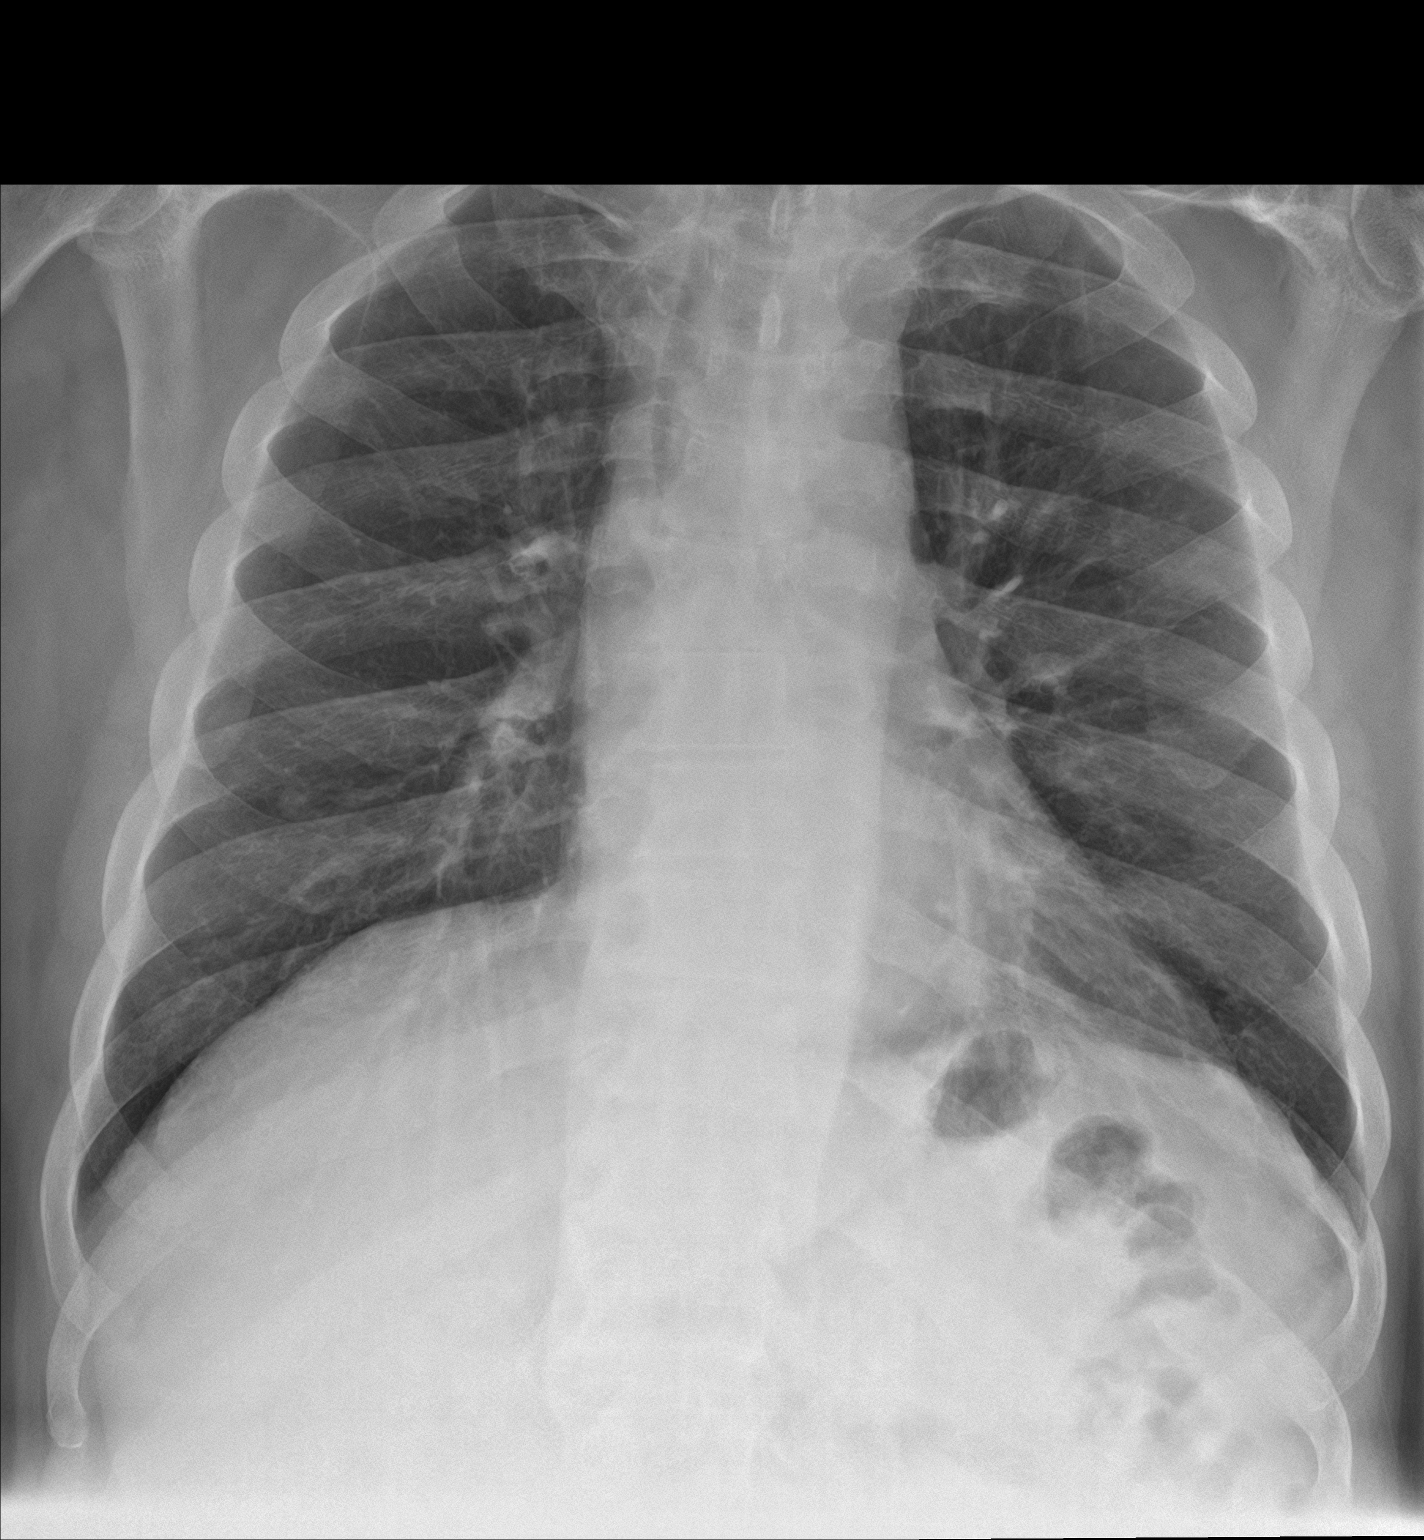

[chest lat]
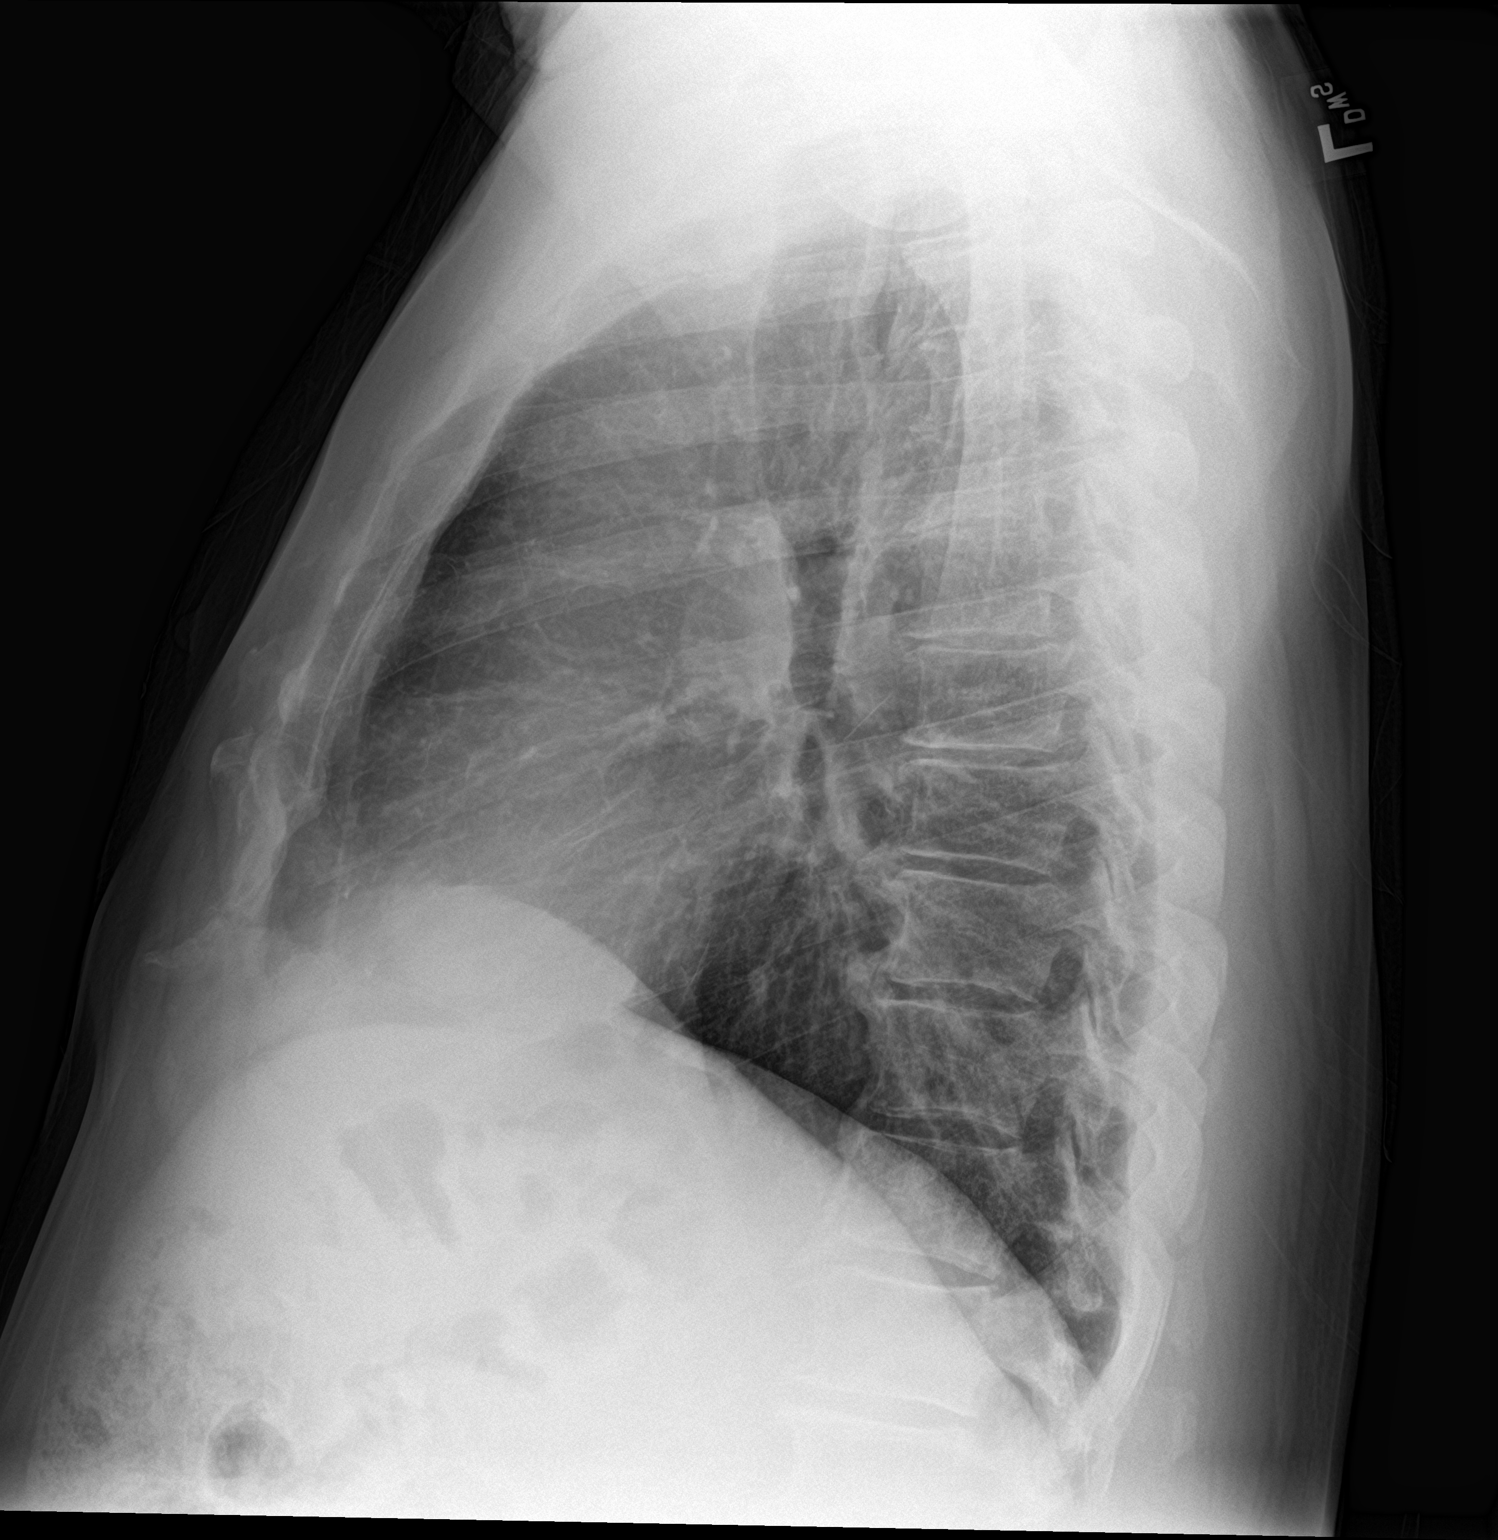

[2 of 2 positions shown; findings below may reference images not displayed]

FINDINGS: Lungs are adequately inflated and otherwise clear. Cardiomediastinal
silhouette and remainder of the exam is unchanged.
IMPRESSION: No active cardiopulmonary disease.

## 2022-08-23 DIAGNOSIS — Z1331 Encounter for screening for depression: Secondary | ICD-10-CM | POA: Diagnosis not present

## 2022-08-23 DIAGNOSIS — Z794 Long term (current) use of insulin: Secondary | ICD-10-CM | POA: Diagnosis not present

## 2022-08-23 DIAGNOSIS — I10 Essential (primary) hypertension: Secondary | ICD-10-CM | POA: Diagnosis not present

## 2022-08-23 DIAGNOSIS — E039 Hypothyroidism, unspecified: Secondary | ICD-10-CM | POA: Diagnosis not present

## 2022-08-23 DIAGNOSIS — Z125 Encounter for screening for malignant neoplasm of prostate: Secondary | ICD-10-CM | POA: Diagnosis not present

## 2022-08-23 DIAGNOSIS — Z0000 Encounter for general adult medical examination without abnormal findings: Secondary | ICD-10-CM | POA: Diagnosis not present

## 2022-08-23 DIAGNOSIS — E1140 Type 2 diabetes mellitus with diabetic neuropathy, unspecified: Secondary | ICD-10-CM | POA: Diagnosis not present

## 2022-10-22 ENCOUNTER — Other Ambulatory Visit: Payer: Self-pay

## 2022-10-22 ENCOUNTER — Emergency Department: Payer: Medicare HMO

## 2022-10-22 ENCOUNTER — Emergency Department
Admission: EM | Admit: 2022-10-22 | Discharge: 2022-10-22 | Disposition: A | Discharge status: Home / Self Care | Payer: Medicare HMO | Attending: Emergency Medicine | Admitting: Emergency Medicine

## 2022-10-22 DIAGNOSIS — K8020 Calculus of gallbladder without cholecystitis without obstruction: Secondary | ICD-10-CM | POA: Diagnosis not present

## 2022-10-22 DIAGNOSIS — R1013 Epigastric pain: Secondary | ICD-10-CM | POA: Diagnosis present

## 2022-10-22 DIAGNOSIS — R1011 Right upper quadrant pain: Secondary | ICD-10-CM | POA: Diagnosis not present

## 2022-10-22 DIAGNOSIS — E119 Type 2 diabetes mellitus without complications: Secondary | ICD-10-CM | POA: Diagnosis not present

## 2022-10-22 DIAGNOSIS — R0789 Other chest pain: Secondary | ICD-10-CM | POA: Diagnosis not present

## 2022-10-22 DIAGNOSIS — I10 Essential (primary) hypertension: Secondary | ICD-10-CM | POA: Diagnosis not present

## 2022-10-22 DIAGNOSIS — R079 Chest pain, unspecified: Secondary | ICD-10-CM | POA: Diagnosis not present

## 2022-10-22 DIAGNOSIS — R1010 Upper abdominal pain, unspecified: Secondary | ICD-10-CM

## 2022-10-22 LAB — BASIC METABOLIC PANEL
Anion gap: 11 (ref 5–15)
BUN: 11 mg/dL (ref 8–23)
CO2: 26 mmol/L (ref 22–32)
Calcium: 9.3 mg/dL (ref 8.9–10.3)
Chloride: 103 mmol/L (ref 98–111)
Creatinine, Ser: 0.97 mg/dL (ref 0.61–1.24)
GFR, Estimated: >60 mL/min (ref >60)
Glucose, Bld: 126 mg/dL — ABNORMAL HIGH (ref 70–99)
Potassium: 4.2 mmol/L (ref 3.5–5.1)
Sodium: 140 mmol/L (ref 135–145)

## 2022-10-22 LAB — CBC
HCT: 44.3 % (ref 39.0–52.0)
Hemoglobin: 15.1 g/dL (ref 13.0–17.0)
MCH: 30.6 pg (ref 26.0–34.0)
MCHC: 34.1 g/dL (ref 30.0–36.0)
MCV: 89.9 fL (ref 80.0–100.0)
Platelets: 129 10*3/uL — ABNORMAL LOW (ref 150–400)
RBC: 4.93 MIL/uL (ref 4.22–5.81)
RDW: 13.9 % (ref 11.5–15.5)
WBC: 5.3 10*3/uL (ref 4.0–10.5)
nRBC: 0 % (ref 0.0–0.2)

## 2022-10-22 LAB — HEPATIC FUNCTION PANEL
ALT: 14 U/L (ref 0–44)
AST: 23 U/L (ref 15–41)
Albumin: 4.4 g/dL (ref 3.5–5.0)
Alkaline Phosphatase: 65 U/L (ref 38–126)
Bilirubin, Direct: 0.2 mg/dL (ref 0.0–0.2)
Indirect Bilirubin: 0.9 mg/dL (ref 0.3–0.9)
Total Bilirubin: 1.1 mg/dL (ref 0.3–1.2)
Total Protein: 7.5 g/dL (ref 6.5–8.1)

## 2022-10-22 LAB — TROPONIN I (HIGH SENSITIVITY): Troponin I (High Sensitivity): 3 ng/L (ref <18)

## 2022-10-22 LAB — LIPASE, BLOOD: Lipase: 41 U/L (ref 11–51)

## 2022-10-22 MED ORDER — ONDANSETRON HCL 4 MG/2ML IJ SOLN
4.0000 mg | Freq: Once | INTRAMUSCULAR | Status: AC
  Administered 2022-10-22: 4 mg via INTRAVENOUS
  Filled 2022-10-22: qty 2

## 2022-10-22 MED ORDER — NAPROXEN 500 MG PO TABS
500.0000 mg | ORAL_TABLET | Freq: Two times a day (BID) | ORAL | 0 refills | Status: DC
  Filled 2022-10-22: qty 20, 10d supply, fill #0

## 2022-10-22 MED ORDER — ONDANSETRON 4 MG PO TBDP
4.0000 mg | ORAL_TABLET | Freq: Three times a day (TID) | ORAL | 0 refills | Status: DC | PRN
  Filled 2022-10-22: qty 20, 7d supply, fill #0

## 2022-10-22 MED ORDER — FAMOTIDINE 20 MG PO TABS
20.0000 mg | ORAL_TABLET | Freq: Two times a day (BID) | ORAL | 0 refills | Status: DC
  Filled 2022-10-22: qty 60, 30d supply, fill #0

## 2022-10-22 MED ORDER — NAPROXEN 500 MG PO TABS: 500.0000 mg | ORAL_TABLET | Freq: Two times a day (BID) | ORAL | 0 refills | Status: DC

## 2022-10-22 MED ORDER — PANTOPRAZOLE SODIUM 40 MG IV SOLR
40.0000 mg | Freq: Once | INTRAVENOUS | Status: AC
  Administered 2022-10-22: 40 mg via INTRAVENOUS
  Filled 2022-10-22: qty 10

## 2022-10-22 MED ORDER — FAMOTIDINE 20 MG PO TABS: 20.0000 mg | ORAL_TABLET | Freq: Two times a day (BID) | ORAL | 0 refills | Status: AC

## 2022-10-22 MED ORDER — KETOROLAC TROMETHAMINE 15 MG/ML IJ SOLN
15.0000 mg | Freq: Once | INTRAMUSCULAR | Status: AC
  Administered 2022-10-22: 15 mg via INTRAVENOUS
  Filled 2022-10-22: qty 1

## 2022-10-22 MED ORDER — SODIUM CHLORIDE 0.9 % IV BOLUS
1000.0000 mL | Freq: Once | INTRAVENOUS | Status: AC
  Administered 2022-10-22: 1000 mL via INTRAVENOUS

## 2022-10-22 NOTE — Discharge Instructions (Signed)
Your lab tests were all normal today. Your ultrasound shows gallstones but no signs of gallbladder infection.  Focus on hydration at home and follow a simple diet for the next few days.  Follow up with general surgery in their office for further evaluation of the gallstones.

## 2022-10-22 NOTE — ED Triage Notes (Signed)
Pt comes with c/o cp that started at 04am this morning. Pt states no hx of this. Pt states right sided under rib and radiates to his back. Pt states some mid sternal as well. Pt denies any sob. Pt stats nausea and vomiting.g

## 2022-10-22 NOTE — ED Notes (Signed)
See triage note  Presents with some right side chest discomfort  States it is radiating into back  Denies any injury ,fever or n/v

## 2022-10-22 NOTE — ED Provider Notes (Signed)
[facility] Provider Note    Event Date/Time   First MD Initiated Contact with Patient 10/22/22 [phone]     (approximate)   History   Chief Complaint: Chest Pain   HPI  Nathan Wong is a 67 y.o. male with a history of hypertension, diabetes, GERD who comes ED complaining of epigastric and right upper quadrant abdominal pain radiating to the back, worse laying down, better sitting upright, started at 4:00 AM, constant.  Denies chest pain or shortness of breath.  Not pleuritic.  Not exertional.  He does endorse nausea and an episode of vomiting as well as diarrhea yesterday.  No fever or chills.     Physical Exam   Triage Vital Signs: ED Triage Vitals  Encounter Vitals Group     BP 10/22/22 0844 136/82     Systolic BP Percentile --      Diastolic BP Percentile --      Pulse Rate 10/22/22 0844 65     Resp 10/22/22 0844 18     Temp 10/22/22 0844 98 F (36.7 C)     Temp src --      SpO2 10/22/22 0844 100 %     Weight 10/22/22 0844 180 lb (81.6 kg)     Height 10/22/22 0844 5\' 9"  (1.753 m)     Head Circumference --      Peak Flow --      Pain Score 10/22/22 0843 9     Pain Loc --      Pain Education --      Exclude from Growth Chart --     Most recent vital signs: Vitals:   10/22/22 0844  BP: 136/82  Pulse: 65  Resp: 18  Temp: 98 F (36.7 C)  SpO2: 100%    General: Awake, no distress.  CV:  Good peripheral perfusion.  Regular rate rhythm Resp:  Normal effort.  Clear to auscultation bilaterally Abd:  No distention.  Soft with epigastric and right upper quadrant tenderness Other:  Moist oral mucosa, no lower extremity edema or calf tenderness   ED Results / Procedures / Treatments   Labs (all labs ordered are listed, but only abnormal results are displayed) Labs Reviewed  BASIC METABOLIC PANEL - Abnormal; Notable for the following components:      Result Value   Glucose, Bld 126 (*)    All other components within normal limits  CBC  - Abnormal; Notable for the following components:   Platelets 129 (*)    All other components within normal limits  LIPASE, BLOOD  HEPATIC FUNCTION PANEL  TROPONIN I (HIGH SENSITIVITY)     EKG Interpreted by me Normal sinus rhythm rate of 64.  Normal axis intervals QRS ST segments and T waves   RADIOLOGY Chest x-ray interpreted by me, appears normal.  Radiology report reviewed   PROCEDURES:  Procedures   MEDICATIONS ORDERED IN ED: Medications  sodium chloride 0.9 % bolus 1,000 mL (1,000 mLs Intravenous New Bag/Given 10/22/22 1014)  ondansetron (ZOFRAN) injection 4 mg (4 mg Intravenous Given 10/22/22 1014)  ketorolac (TORADOL) 15 MG/ML injection 15 mg (15 mg Intravenous Given 10/22/22 1015)  pantoprazole (PROTONIX) injection 40 mg (40 mg Intravenous Given 10/22/22 1015)     IMPRESSION / MDM / ASSESSMENT AND PLAN / ED COURSE  I reviewed the triage vital signs and the nursing notes.  DDx: Pancreatitis, cholecystitis, choledocholithiasis, gastritis, viral illness/COVID, pneumothorax, less likely NSTEMI.  Doubt PE, dissection, pericardial effusion.  Patient's  presentation is most consistent with acute presentation with potential threat to life or bodily function.  Patient presents with upper abdominal pain with tenderness.  Troponin obtained at least 4 hours after symptom onset is negative, AMI ruled out, symptoms not consistent with unstable angina.  Will add on LFTs and lipase, obtain right upper quadrant ultrasound, give IV fluids Protonix Toradol Zofran for hydration and symptom relief.   Clinical Course as of 10/22/22 1348  Tue Oct 22, 2022  0932 EKG 12-Lead [MM]  [phone] ED EKG [MM]  1111 Lipase: 41 [MM]  1345 Feels much better, abd benign. [place] neg. For cc-itis. Stable for DC [PS]    Clinical Course User Index [MM] Minor, Mason, Student-PA [PS] Sharman Cheek, MD     FINAL CLINICAL IMPRESSION(S) / ED DIAGNOSES   Final diagnoses:  Pain of upper abdomen  Calculus  of gallbladder without cholecystitis without obstruction     Rx / DC Orders   ED Discharge Orders          Ordered    naproxen (NAPROSYN) 500 MG tablet  2 times daily with meals        10/22/22 1347    ondansetron (ZOFRAN-ODT) 4 MG disintegrating tablet  Every 8 hours PRN        10/22/22 1347    famotidine (PEPCID) 20 MG tablet  2 times daily        10/22/22 1347             Note:  This document was prepared using Dragon voice recognition software and may include unintentional dictation errors.   Sharman Cheek, MD 10/22/22 1348

## 2022-10-30 ENCOUNTER — Other Ambulatory Visit: Payer: Self-pay

## 2022-11-04 ENCOUNTER — Ambulatory Visit: Payer: Self-pay | Admitting: Surgery

## 2022-11-05 ENCOUNTER — Ambulatory Visit: Payer: Medicare HMO | Admitting: General Surgery

## 2022-11-05 ENCOUNTER — Ambulatory Visit: Payer: Self-pay | Admitting: General Surgery

## 2022-11-05 ENCOUNTER — Encounter: Payer: Self-pay | Admitting: General Surgery

## 2022-11-05 ENCOUNTER — Telehealth: Payer: Self-pay | Admitting: General Surgery

## 2022-11-05 VITALS — BP 161/84 | HR 86 | Temp 98.2°F | Ht 68.0 in | Wt 173.2 lb

## 2022-11-05 DIAGNOSIS — K8020 Calculus of gallbladder without cholecystitis without obstruction: Secondary | ICD-10-CM

## 2022-11-05 DIAGNOSIS — E1140 Type 2 diabetes mellitus with diabetic neuropathy, unspecified: Secondary | ICD-10-CM

## 2022-11-05 HISTORY — DX: Calculus of gallbladder without cholecystitis without obstruction: K80.20

## 2022-11-05 NOTE — Progress Notes (Signed)
Patient ID: Nathan Wong, male   DOB: [birth year], 67 y.o.   MRN: [id number]  CC: Abdominal pain/symptomatic cholelithiais   History of Present Illness Nathan Wong is a 67 y.o. male who presents to clinic after ED visit for abdominal pain. He states the abdominal pain is located in epigastric and RUQ, describes it as sharp and radiates to back. This was associated with nausea and vomiting when he presented to ED but he has no more episdoes of vomiting. It is worse after eating. Denies jaundice, fevers, chills. .  Past Medical History Past Medical History:  Diagnosis Date   Arthritis    Diabetes mellitus without complication (HCC)    H/O hernia repair    Hypertension        Past Surgical History:  Procedure Laterality Date   HERNIA REPAIR     abdominal    No Known Allergies  Current Outpatient Medications  Medication Sig Dispense Refill   acetaminophen (TYLENOL) 500 MG tablet Take 500 mg by mouth every 6 (six) hours as needed for mild pain.      aspirin EC 81 MG tablet Take 81 mg by mouth daily.     azithromycin (ZITHROMAX Z-PAK) 250 MG tablet Take 2 tablets (500 mg) on  Day 1,  followed by 1 tablet (250 mg) once daily on Days 2 through 5. 6 each 0   blood glucose meter kit and supplies KIT Dispense based on patient and insurance preference. Use up to four times daily as directed. (FOR ICD-9 250.00, 250.01). 1 each 0   Blood Pressure KIT 1 kit by Does not apply route daily. 1 kit 0   cholecalciferol (VITAMIN D3) 25 MCG (1000 UT) tablet Take 1,000 Units by mouth daily.     diltiazem (CARDIZEM CD) 180 MG 24 hr capsule Take 1 capsule (180 mg total) by mouth daily. 30 capsule 0   famotidine (PEPCID) 20 MG tablet Take 1 tablet (20 mg total) by mouth 2 (two) times daily. 60 tablet 0   feeding supplement, ENSURE ENLIVE, (ENSURE ENLIVE) LIQD Take 237 mLs by mouth 2 (two) times daily between meals. 14220 mL 0   gabapentin (NEURONTIN) 400 MG capsule Take 1 capsule (400 mg total) by mouth 3  (three) times daily. 90 capsule 5   insulin degludec (TRESIBA) 200 UNIT/ML FlexTouch Pen Inject 20 Units into the skin at bedtime. 5 pen 4   levothyroxine (SYNTHROID) 50 MCG tablet Take 1 tablet (50 mcg total) by mouth daily before breakfast. 30 tablet 3   metFORMIN (GLUCOPHAGE) 1000 MG tablet Take 1 tablet (1,000 mg total) by mouth 2 (two) times daily with a meal. 60 tablet 3   naproxen (NAPROSYN) 500 MG tablet Take 1 tablet (500 mg total) by mouth 2 (two) times daily with a meal. 20 tablet 0   omeprazole (PRILOSEC) 20 MG capsule Take 1 capsule (20 mg total) by mouth daily. 30 capsule 3   ondansetron (ZOFRAN-ODT) 4 MG disintegrating tablet Take 1 tablet (4 mg total) by mouth every 8 (eight) hours as needed for nausea or vomiting. 20 tablet 0   predniSONE (STERAPRED UNI-PAK 21 TAB) 10 MG (21) TBPK tablet Take 6 pills on day one then decrease by 1 pill each day 21 tablet 0   zinc sulfate 220 (50 Zn) MG capsule Take 1 capsule (220 mg total) by mouth daily. 30 capsule 0   No current facility-administered medications for this visit.    Family History Family History  Problem Relation Age of  Onset   CAD Mother    Diabetes Sister    Hypertension Sister    Early death Neg Hx       Social History Social History   Tobacco Use   Smoking status: Never   Smokeless tobacco: Never  Vaping Use   Vaping status: Never Used  Substance Use Topics   Alcohol use: No   Drug use: No       ROS Full ROS of systems performed and is otherwise negative there than what is stated in the HPI Social Hx: likes to hunt  Physical Exam   CONSTITUTIONAL: NAD EYES: Pupils equal, round, and reactive to light, Sclera non-icteric. EARS, NOSE, MOUTH AND THROAT: The oropharynx is clear. Hearing is intact to voice.  NECK: Trachea is midline, and there is no jugular venous distension.  RESPIRATORY:  Lungs are clear, and breath sounds are equal bilaterally. Normal respiratory effort without pathologic use of  accessory muscles. CARDIOVASCULAR: Heart is regular without murmurs, gallops, or rubs. GI: The abdomen is  soft, nontender, and nondistended. There were no palpable masses. There was no hepatosplenomegaly. There were normal bowel sounds. Umbilical incision well healed, infra-umbilical MUSCULOSKELETAL:  Normal muscle strength and tone in all four extremities.    NEUROLOGIC:  Motor and sensation is grossly normal.  Cranial nerves are grossly intact.  Data Reviewed Labs reviewed: normal total bilirubin, Cr normal and glucose slightly elevated Imaging reviewed, [place] with stones in gallbladder but no wall thickening or edema appreciated  I have personally reviewed the patient's imaging and medical records.    Assessment    67 year old male with epigastric and right upper quadrant pain after eating and ultrasound showing gallstones consistent with symptomatic cholelithiasis.  Plan    Discussed with patient the nature of symptomatic cholelithiasis and pathophysiology.  Recommended undergoing surgical excision with a robot-assisted  cholecystectomy with ICG.  I discussed risks, benefits and alternatives of the procedure including infection, bleeding, retained stone, bile leak, common bile duct injury.  He understands these risks and wishes to proceed with surgery.  He should stop his aspirin 5 days prior to surgery.    Kandis Cocking 11/05/2022, 8:21 AM

## 2022-11-05 NOTE — Telephone Encounter (Signed)
Patient has been advised of Pre-Admission date/time, and Surgery date at [facility]  Surgery Date: 11/13/22 Preadmission Testing Date: 11/07/22 (phone 1p-4p)  Patient has been made aware to call [phone], between 1-3:00pm the day before surgery, to find out what time to arrive for surgery.

## 2022-11-05 NOTE — Patient Instructions (Signed)
You have requested to have your gallbladder removed. This will be done at [facility] with Dr. Maurine Minister.  You will most likely be out of work 1-2 weeks for this surgery.  If you have FMLA or disability paperwork that needs filled out you may drop this off at our office or this can be faxed to [phone].  You will return after your post-op appointment with a lifting restriction for approximately 4 more weeks.  You will be able to eat anything you would like to following surgery. But, start by eating a bland diet and advance this as tolerated. The Gallbladder diet is below, please go as closely by this diet as possible prior to surgery to avoid any further attacks.  Please see the (blue)pre-care form that you have been given today. Our surgery scheduler will call you to verify surgery date and to go over information.   If you have any questions, please call our office.  Laparoscopic Cholecystectomy Laparoscopic cholecystectomy is surgery to remove the gallbladder. The gallbladder is located in the upper right part of the abdomen, behind the liver. It is a storage sac for bile, which is produced in the liver. Bile aids in the digestion and absorption of fats. Cholecystectomy is often done for inflammation of the gallbladder (cholecystitis). This condition is usually caused by a buildup of gallstones (cholelithiasis) in the gallbladder. Gallstones can block the flow of bile, and that can result in inflammation and pain. In severe cases, emergency surgery may be required. If emergency surgery is not required, you will have time to prepare for the procedure. Laparoscopic surgery is an alternative to open surgery. Laparoscopic surgery has a shorter recovery time. Your common bile duct may also need to be examined during the procedure. If stones are found in the common bile duct, they may be removed. LET [facility] CARE PROVIDER KNOW ABOUT: Any allergies you have. All medicines you are taking,  including vitamins, herbs, eye drops, creams, and over-the-counter medicines. Previous problems you or members of your family have had with the use of anesthetics. Any blood disorders you have. Previous surgeries you have had.  Any medical conditions you have. RISKS AND COMPLICATIONS Generally, this is a safe procedure. However, problems may occur, including: Infection. Bleeding. Allergic reactions to medicines. Damage to other structures or organs. A stone remaining in the common bile duct. A bile leak from the cyst duct that is clipped when your gallbladder is removed. The need to convert to open surgery, which requires a larger incision in the abdomen. This may be necessary if your surgeon thinks that it is not safe to continue with a laparoscopic procedure. BEFORE THE PROCEDURE Ask your health care provider about: Changing or stopping your regular medicines. This is especially important if you are taking diabetes medicines or blood thinners. Taking medicines such as aspirin and ibuprofen. These medicines can thin your blood. Do not take these medicines before your procedure if your health care provider instructs you not to. Follow instructions from your health care provider about eating or drinking restrictions. Let your health care provider know if you develop a cold or an infection before surgery. Plan to have someone take you home after the procedure. Ask your health care provider how your surgical site will be marked or identified. You may be given antibiotic medicine to help prevent infection. PROCEDURE To reduce your risk of infection: Your health care team will wash or sanitize their hands. Your skin will be washed with soap. An IV  tube may be inserted into one of your veins. You will be given a medicine to make you fall asleep (general anesthetic). A breathing tube will be placed in your mouth. The surgeon will make several small cuts (incisions) in your abdomen. A thin,  lighted tube (laparoscope) that has a tiny camera on the end will be inserted through one of the small incisions. The camera on the laparoscope will send a picture to a TV screen (monitor) in the operating room. This will give the surgeon a good view inside your abdomen. A gas will be pumped into your abdomen. This will expand your abdomen to give the surgeon more room to perform the surgery. Other tools that are needed for the procedure will be inserted through the other incisions. The gallbladder will be removed through one of the incisions. After your gallbladder has been removed, the incisions will be closed with stitches (sutures), staples, or skin glue. Your incisions may be covered with a bandage (dressing). The procedure may vary among health care providers and hospitals. AFTER THE PROCEDURE Your blood pressure, heart rate, breathing rate, and blood oxygen level will be monitored often until the medicines you were given have worn off. You will be given medicines as needed to control your pain.   This information is not intended to replace advice given to you by your health care provider. Make sure you discuss any questions you have with your health care provider.   Document Released: 01/21/2005 Document Revised: 10/12/2014 Document Reviewed: 09/02/2012 Elsevier Interactive Patient Education 2016 [employer] Inc.   Low-Fat Diet for Gallbladder Conditions A low-fat diet can be helpful if you have pancreatitis or a gallbladder condition. With these conditions, your pancreas and gallbladder have trouble digesting fats. A healthy eating plan with less fat will help rest your pancreas and gallbladder and reduce your symptoms. WHAT DO I NEED TO KNOW ABOUT THIS DIET? Eat a low-fat diet. Reduce your fat intake to less than 20-30% of your total daily calories. This is less than 50-60 g of fat per day. Remember that you need some fat in your diet. Ask your dietician what your daily goal should  be. Choose nonfat and low-fat healthy foods. Look for the words "nonfat," "low fat," or "fat free." As a guide, look on the label and choose foods with less than 3 g of fat per serving. Eat only one serving. Avoid alcohol. Do not smoke. If you need help quitting, talk with your health care provider. Eat small frequent meals instead of three large heavy meals. WHAT FOODS CAN I EAT? Grains Include healthy grains and starches such as potatoes, wheat bread, fiber-rich cereal, and brown rice. Choose whole grain options whenever possible. In adults, whole grains should account for 45-65% of your daily calories.  Fruits and Vegetables Eat plenty of fruits and vegetables. Fresh fruits and vegetables add fiber to your diet. Meats and Other Protein Sources Eat lean meat such as chicken and pork. Trim any fat off of meat before cooking it. Eggs, fish, and beans are other sources of protein. In adults, these foods should account for 10-35% of your daily calories. Dairy Choose low-fat milk and dairy options. Dairy includes fat and protein, as well as calcium.  Fats and Oils Limit high-fat foods such as fried foods, sweets, baked goods, sugary drinks.  Other Creamy sauces and condiments, such as mayonnaise, can add extra fat. Think about whether or not you need to use them, or use smaller amounts or low fat options.  WHAT FOODS ARE NOT RECOMMENDED? High fat foods, such as: [employer]. Ice cream. [place] toast. Sweet rolls. Pizza. Cheese bread. Foods covered with batter, butter, creamy sauces, or cheese. Fried foods. Sugary drinks and desserts. Foods that cause gas or bloating   This information is not intended to replace advice given to you by your health care provider. Make sure you discuss any questions you have with your health care provider.   Document Released: 01/26/2013 Document Reviewed: 01/26/2013 Elsevier Interactive Patient Education [employer]

## 2022-11-07 ENCOUNTER — Encounter
Admission: RE | Admit: 2022-11-07 | Discharge: 2022-11-07 | Disposition: A | Discharge status: Home / Self Care | Payer: Medicare HMO | Source: Ambulatory Visit | Attending: General Surgery | Admitting: General Surgery

## 2022-11-07 ENCOUNTER — Other Ambulatory Visit: Payer: Self-pay

## 2022-11-07 VITALS — Ht 68.0 in | Wt 178.0 lb

## 2022-11-07 DIAGNOSIS — Z01812 Encounter for preprocedural laboratory examination: Secondary | ICD-10-CM

## 2022-11-07 HISTORY — DX: Long term (current) use of insulin: E11.9

## 2022-11-07 HISTORY — DX: Long term (current) use of insulin: Z79.4

## 2022-11-07 HISTORY — DX: Gastro-esophageal reflux disease without esophagitis: K21.9

## 2022-11-07 HISTORY — DX: Essential (primary) hypertension: I10

## 2022-11-07 HISTORY — DX: Diverticulosis of intestine, part unspecified, without perforation or abscess without bleeding: K57.90

## 2022-11-07 HISTORY — DX: Hypothyroidism, unspecified: E03.9

## 2022-11-07 HISTORY — DX: Gout, unspecified: M10.9

## 2022-11-07 HISTORY — DX: Acute kidney failure, unspecified: N17.9

## 2022-11-07 NOTE — Patient Instructions (Addendum)
Your procedure is scheduled on: Wednesday, October 9 Report to the Registration Desk on the 1st floor of the [facility] To find out your arrival time, please call [phone] between 1PM - 3PM on: Tuesday, October 8 If your arrival time is 6:00 am, do not arrive before that time as the [facility] entrance doors do not open until 6:00 am.  REMEMBER: Instructions that are not followed completely may result in serious medical risk, up to and including death; or upon the discretion of your surgeon and anesthesiologist your surgery may need to be rescheduled.  Do not eat food after midnight the night before surgery.  No gum chewing or hard candies.  You may however, drink water up to 2 hours before you are scheduled to arrive for your surgery. Do not drink anything within 2 hours of your scheduled arrival time.  One week prior to surgery: starting today, October 3 Stop Anti-inflammatories (NSAIDS) such as Advil, Aleve, Ibuprofen, Motrin, Naproxen, Naprosyn and Aspirin based products such as Excedrin, Goody's Powder, BC Powder. Stop ANY OVER THE COUNTER supplements until after surgery. Stop multiple vitamins. You may however, continue to take Tylenol if needed for pain up until the day of surgery.  Jardiance (empagliflozin) - hold for 3 days before surgery. Last day to take jardiance is Saturday. October 5. Resume AFTER surgery.  Metformin - hold for 2 days before surgery. Last day to take Metformin is Sunday, October 6. Resume AFTER surgery.  Lantus insulin - only take half of your dose the night before surgery. Only take 5 units on Tuesday, October 8 evening.  Continue taking all your other prescribed medications up until the day of surgery.  TAKE ONLY THESE MEDICATIONS THE MORNING OF SURGERY WITH A SIP OF WATER:  Famotidine (Pepcid) - (take one the night before and one on the morning of surgery - helps to prevent nausea after surgery.) Gabapentin Rosuvastatin (Crestor)  Use  albuterol inhaler on the day of surgery and bring to the hospital.  No Alcohol for 24 hours before or after surgery.  No Smoking including e-cigarettes for 24 hours before surgery.  No chewable tobacco products for at least 6 hours before surgery.  No nicotine patches on the day of surgery.  Do not use any "recreational" drugs for at least a week (preferably 2 weeks) before your surgery.  Please be advised that the combination of cocaine and anesthesia may have negative outcomes, up to and including death. If you test positive for cocaine, your surgery will be cancelled.  On the morning of surgery brush your teeth with toothpaste and water, you may rinse your mouth with mouthwash if you wish. Do not swallow any toothpaste or mouthwash.  Use CHG Soap as directed on instruction sheet.  Do not wear jewelry, make-up, hairpins, clips or nail polish.  For welded (permanent) jewelry: bracelets, anklets, waist bands, etc.  Please have this removed prior to surgery.  If it is not removed, there is a chance that hospital personnel will need to cut it off on the day of surgery.  Do not wear lotions, powders, or perfumes.   Do not shave body hair from the neck down 48 hours before surgery.  Contact lenses, hearing aids and dentures may not be worn into surgery.  Do not bring valuables to the hospital. [facility] is not responsible for any missing/lost belongings or valuables.   Notify your doctor if there is any change in your medical condition (cold, fever, infection).  Wear  comfortable clothing (specific to your surgery type) to the hospital.  After surgery, you can help prevent lung complications by doing breathing exercises.  Take deep breaths and cough every 1-2 hours.   If you are being discharged the day of surgery, you will not be allowed to drive home. You will need a responsible individual to drive you home and stay with you for 24 hours after surgery.   If you are taking public  transportation, you will need to have a responsible individual with you.  Please call the Pre-admissions Testing Dept. at [phone] if you have any questions about these instructions.  Surgery Visitation Policy:  Patients having surgery or a procedure may have two visitors.  Children under the age of 100 must have an adult with them who is not the patient.     Preparing for Surgery with CHLORHEXIDINE GLUCONATE (CHG) Soap  Chlorhexidine Gluconate (CHG) Soap  o An antiseptic cleaner that kills germs and bonds with the skin to continue killing germs even after washing  o Used for showering the night before surgery and morning of surgery  Before surgery, you can play an important role by reducing the number of germs on your skin.  CHG (Chlorhexidine gluconate) soap is an antiseptic cleanser which kills germs and bonds with the skin to continue killing germs even after washing.  Please do not use if you have an allergy to CHG or antibacterial soaps. If your skin becomes reddened/irritated stop using the CHG.  1. Shower the NIGHT BEFORE SURGERY and the MORNING OF SURGERY with CHG soap.  2. If you choose to wash your hair, wash your hair first as usual with your normal shampoo.  3. After shampooing, rinse your hair and body thoroughly to remove the shampoo.  4. Use CHG as you would any other liquid soap. You can apply CHG directly to the skin and wash gently with a scrungie or a clean washcloth.  5. Apply the CHG soap to your body only from the neck down. Do not use on open wounds or open sores. Avoid contact with your eyes, ears, mouth, and genitals (private parts). Wash face and genitals (private parts) with your normal soap.  6. Wash thoroughly, paying special attention to the area where your surgery will be performed.  7. Thoroughly rinse your body with warm water.  8. Do not shower/wash with your normal soap after using and rinsing off the CHG soap.  9. Pat yourself dry  with a clean towel.  10. Wear clean pajamas to bed the night before surgery.  12. Place clean sheets on your bed the night of your first shower and do not sleep with pets.  13. Shower again with the CHG soap on the day of surgery prior to arriving at the hospital.  14. Do not apply any deodorants/lotions/powders.  15. Please wear clean clothes to the hospital.

## 2022-11-12 MED ORDER — CHLORHEXIDINE GLUCONATE 0.12 % MT SOLN
15.0000 mL | Freq: Once | OROMUCOSAL | Status: AC
  Administered 2022-11-13: 15 mL via OROMUCOSAL

## 2022-11-12 MED ORDER — ORAL CARE MOUTH RINSE: 15.0000 mL | Freq: Once | OROMUCOSAL | Status: AC

## 2022-11-12 MED ORDER — CEFAZOLIN SODIUM-DEXTROSE 2-4 GM/100ML-% IV SOLN
2.0000 g | INTRAVENOUS | Status: AC
  Administered 2022-11-13: 2 g via INTRAVENOUS

## 2022-11-12 MED ORDER — CHLORHEXIDINE GLUCONATE CLOTH 2 % EX PADS
6.0000 | MEDICATED_PAD | Freq: Once | CUTANEOUS | Status: AC
  Administered 2022-11-13: 6 via TOPICAL

## 2022-11-12 MED ORDER — SODIUM CHLORIDE 0.9 % IV SOLN: INTRAVENOUS | Status: DC

## 2022-11-12 MED ORDER — INDOCYANINE GREEN 25 MG IV SOLR: 7.5000 mg | Freq: Once | INTRAVENOUS | Status: DC

## 2022-11-12 MED ORDER — CHLORHEXIDINE GLUCONATE CLOTH 2 % EX PADS: 6.0000 | MEDICATED_PAD | Freq: Once | CUTANEOUS | Status: DC

## 2022-11-13 ENCOUNTER — Ambulatory Visit: Payer: Medicare HMO | Admitting: Anesthesiology

## 2022-11-13 ENCOUNTER — Ambulatory Visit
Admission: RE | Admit: 2022-11-13 | Discharge: 2022-11-13 | Disposition: A | Discharge status: Home / Self Care | Payer: Medicare HMO | Attending: General Surgery | Admitting: General Surgery

## 2022-11-13 ENCOUNTER — Other Ambulatory Visit: Payer: Self-pay

## 2022-11-13 ENCOUNTER — Ambulatory Visit: Payer: Self-pay | Admitting: General Surgery

## 2022-11-13 ENCOUNTER — Encounter
Admission: RE | Disposition: A | Discharge status: Home / Self Care | Payer: Self-pay | Source: Home / Self Care | Attending: General Surgery

## 2022-11-13 ENCOUNTER — Ambulatory Visit: Payer: Self-pay | Admitting: Urgent Care

## 2022-11-13 ENCOUNTER — Encounter: Payer: Self-pay | Admitting: General Surgery

## 2022-11-13 DIAGNOSIS — E119 Type 2 diabetes mellitus without complications: Secondary | ICD-10-CM | POA: Insufficient documentation

## 2022-11-13 DIAGNOSIS — K811 Chronic cholecystitis: Secondary | ICD-10-CM

## 2022-11-13 DIAGNOSIS — K8010 Calculus of gallbladder with chronic cholecystitis without obstruction: Secondary | ICD-10-CM | POA: Diagnosis not present

## 2022-11-13 DIAGNOSIS — K219 Gastro-esophageal reflux disease without esophagitis: Secondary | ICD-10-CM | POA: Insufficient documentation

## 2022-11-13 DIAGNOSIS — Z01812 Encounter for preprocedural laboratory examination: Secondary | ICD-10-CM

## 2022-11-13 DIAGNOSIS — Z794 Long term (current) use of insulin: Secondary | ICD-10-CM | POA: Insufficient documentation

## 2022-11-13 DIAGNOSIS — E039 Hypothyroidism, unspecified: Secondary | ICD-10-CM | POA: Insufficient documentation

## 2022-11-13 DIAGNOSIS — K8012 Calculus of gallbladder with acute and chronic cholecystitis without obstruction: Secondary | ICD-10-CM | POA: Diagnosis not present

## 2022-11-13 DIAGNOSIS — I10 Essential (primary) hypertension: Secondary | ICD-10-CM | POA: Insufficient documentation

## 2022-11-13 DIAGNOSIS — K8020 Calculus of gallbladder without cholecystitis without obstruction: Secondary | ICD-10-CM

## 2022-11-13 LAB — GLUCOSE, CAPILLARY
Glucose-Capillary: 112 mg/dL — ABNORMAL HIGH (ref 70–99)
Glucose-Capillary: 192 mg/dL — ABNORMAL HIGH (ref 70–99)

## 2022-11-13 SURGERY — CHOLECYSTECTOMY, ROBOT-ASSISTED, LAPAROSCOPIC
Anesthesia: General | Site: Abdomen

## 2022-11-13 MED ORDER — LIDOCAINE HCL (CARDIAC) PF 100 MG/5ML IV SOSY
PREFILLED_SYRINGE | INTRAVENOUS | Status: DC | PRN
  Administered 2022-11-13: 80 mg via INTRAVENOUS

## 2022-11-13 MED ORDER — PROPOFOL 10 MG/ML IV BOLUS
INTRAVENOUS | Status: DC | PRN
  Administered 2022-11-13: 100 mg via INTRAVENOUS

## 2022-11-13 MED ORDER — ROCURONIUM BROMIDE 100 MG/10ML IV SOLN
INTRAVENOUS | Status: DC | PRN
  Administered 2022-11-13 (×2): 20 mg via INTRAVENOUS
  Administered 2022-11-13: 10 mg via INTRAVENOUS
  Administered 2022-11-13: 50 mg via INTRAVENOUS

## 2022-11-13 MED ORDER — SODIUM CHLORIDE 0.9% FLUSH: 3.0000 mL | Freq: Two times a day (BID) | INTRAVENOUS | Status: DC

## 2022-11-13 MED ORDER — ACETAMINOPHEN 10 MG/ML IV SOLN
INTRAVENOUS | Status: DC | PRN
  Administered 2022-11-13: 1000 mg via INTRAVENOUS

## 2022-11-13 MED ORDER — IBUPROFEN 800 MG PO TABS: 800.0000 mg | ORAL_TABLET | Freq: Three times a day (TID) | ORAL | 0 refills | Status: AC | PRN

## 2022-11-13 MED ORDER — BUPIVACAINE-EPINEPHRINE (PF) 0.5% -1:200000 IJ SOLN
INTRAMUSCULAR | Status: DC | PRN
  Administered 2022-11-13: 20 mL via INTRAMUSCULAR

## 2022-11-13 MED ORDER — PHENYLEPHRINE 80 MCG/ML (10ML) SYRINGE FOR IV PUSH (FOR BLOOD PRESSURE SUPPORT)
PREFILLED_SYRINGE | INTRAVENOUS | Status: DC | PRN
  Administered 2022-11-13 (×2): 120 ug via INTRAVENOUS
  Administered 2022-11-13: 80 ug via INTRAVENOUS
  Administered 2022-11-13 (×3): 120 ug via INTRAVENOUS
  Administered 2022-11-13: 160 ug via INTRAVENOUS
  Administered 2022-11-13: 80 ug via INTRAVENOUS

## 2022-11-13 MED ORDER — ROCURONIUM BROMIDE 10 MG/ML (PF) SYRINGE
PREFILLED_SYRINGE | INTRAVENOUS | Status: AC
  Filled 2022-11-13: qty 10

## 2022-11-13 MED ORDER — FENTANYL CITRATE (PF) 100 MCG/2ML IJ SOLN
INTRAMUSCULAR | Status: AC
  Filled 2022-11-13: qty 2

## 2022-11-13 MED ORDER — PHENYLEPHRINE HCL-NACL 20-0.9 MG/250ML-% IV SOLN
INTRAVENOUS | Status: AC
  Filled 2022-11-13: qty 250

## 2022-11-13 MED ORDER — HYDROMORPHONE HCL 1 MG/ML IJ SOLN
INTRAMUSCULAR | Status: AC
  Filled 2022-11-13: qty 1

## 2022-11-13 MED ORDER — LIDOCAINE HCL (PF) 2 % IJ SOLN
INTRAMUSCULAR | Status: AC
  Filled 2022-11-13: qty 5

## 2022-11-13 MED ORDER — DEXAMETHASONE SODIUM PHOSPHATE 10 MG/ML IJ SOLN
INTRAMUSCULAR | Status: AC
  Filled 2022-11-13: qty 1

## 2022-11-13 MED ORDER — ONDANSETRON HCL 4 MG/2ML IJ SOLN
INTRAMUSCULAR | Status: AC
  Filled 2022-11-13: qty 2

## 2022-11-13 MED ORDER — MIDAZOLAM HCL 2 MG/2ML IJ SOLN
INTRAMUSCULAR | Status: DC | PRN
  Administered 2022-11-13: 2 mg via INTRAVENOUS

## 2022-11-13 MED ORDER — BUPIVACAINE-EPINEPHRINE (PF) 0.5% -1:200000 IJ SOLN
INTRAMUSCULAR | Status: AC
  Filled 2022-11-13: qty 10

## 2022-11-13 MED ORDER — PHENYLEPHRINE HCL-NACL 20-0.9 MG/250ML-% IV SOLN
INTRAVENOUS | Status: DC | PRN
Start: 2022-11-13 — End: 2022-11-13
  Administered 2022-11-13: 20 ug/min via INTRAVENOUS

## 2022-11-13 MED ORDER — KETOROLAC TROMETHAMINE 30 MG/ML IJ SOLN
INTRAMUSCULAR | Status: DC | PRN
  Administered 2022-11-13: 30 mg via INTRAVENOUS

## 2022-11-13 MED ORDER — OXYCODONE HCL 5 MG PO TABS
ORAL_TABLET | ORAL | Status: AC
  Filled 2022-11-13: qty 1

## 2022-11-13 MED ORDER — FENTANYL CITRATE (PF) 100 MCG/2ML IJ SOLN
25.0000 ug | INTRAMUSCULAR | Status: DC | PRN
  Administered 2022-11-13 (×3): 25 ug via INTRAVENOUS

## 2022-11-13 MED ORDER — BUPIVACAINE LIPOSOME 1.3 % IJ SUSP
INTRAMUSCULAR | Status: AC
  Filled 2022-11-13: qty 10

## 2022-11-13 MED ORDER — ONDANSETRON HCL 4 MG/2ML IJ SOLN
4.0000 mg | Freq: Once | INTRAMUSCULAR | Status: AC | PRN
  Administered 2022-11-13: 4 mg via INTRAVENOUS

## 2022-11-13 MED ORDER — OXYCODONE HCL 5 MG PO TABS
5.0000 mg | ORAL_TABLET | Freq: Once | ORAL | Status: AC
  Administered 2022-11-13: 5 mg via ORAL

## 2022-11-13 MED ORDER — PROPOFOL 10 MG/ML IV BOLUS
INTRAVENOUS | Status: AC
  Filled 2022-11-13: qty 40

## 2022-11-13 MED ORDER — KETOROLAC TROMETHAMINE 30 MG/ML IJ SOLN
INTRAMUSCULAR | Status: AC
  Filled 2022-11-13: qty 1

## 2022-11-13 MED ORDER — HYDROMORPHONE HCL 1 MG/ML IJ SOLN
INTRAMUSCULAR | Status: DC | PRN
Start: 2022-11-13 — End: 2022-11-13
  Administered 2022-11-13: .5 mg via INTRAVENOUS

## 2022-11-13 MED ORDER — 0.9 % SODIUM CHLORIDE (POUR BTL) OPTIME
TOPICAL | Status: DC | PRN
  Administered 2022-11-13: 500 mL

## 2022-11-13 MED ORDER — EPHEDRINE SULFATE-NACL 50-0.9 MG/10ML-% IV SOSY
PREFILLED_SYRINGE | INTRAVENOUS | Status: DC | PRN
Start: 2022-11-13 — End: 2022-11-13
  Administered 2022-11-13: 10 mg via INTRAVENOUS
  Administered 2022-11-13: 15 mg via INTRAVENOUS

## 2022-11-13 MED ORDER — INSULIN ASPART 100 UNIT/ML IJ SOLN
4.0000 [IU] | Freq: Once | INTRAMUSCULAR | Status: AC
  Administered 2022-11-13: 4 [IU] via SUBCUTANEOUS

## 2022-11-13 MED ORDER — OXYCODONE HCL 5 MG PO TABS
5.0000 mg | ORAL_TABLET | Freq: Three times a day (TID) | ORAL | 0 refills | Status: DC | PRN
Start: 2022-11-13 — End: 2022-11-27

## 2022-11-13 MED ORDER — INSULIN ASPART 100 UNIT/ML IJ SOLN
INTRAMUSCULAR | Status: AC
  Filled 2022-11-13: qty 1

## 2022-11-13 MED ORDER — INDOCYANINE GREEN 25 MG IV SOLR
2.5000 mg | Freq: Once | INTRAVENOUS | Status: AC
  Administered 2022-11-13: 2.5 mg via TOPICAL

## 2022-11-13 MED ORDER — ONDANSETRON HCL 4 MG/2ML IJ SOLN
INTRAMUSCULAR | Status: DC | PRN
  Administered 2022-11-13: 4 mg via INTRAVENOUS

## 2022-11-13 MED ORDER — DEXAMETHASONE SODIUM PHOSPHATE 10 MG/ML IJ SOLN
INTRAMUSCULAR | Status: DC | PRN
  Administered 2022-11-13: 10 mg via INTRAVENOUS

## 2022-11-13 MED ORDER — PHENYLEPHRINE 80 MCG/ML (10ML) SYRINGE FOR IV PUSH (FOR BLOOD PRESSURE SUPPORT)
PREFILLED_SYRINGE | INTRAVENOUS | Status: AC
  Filled 2022-11-13: qty 20

## 2022-11-13 MED ORDER — INDOCYANINE GREEN 25 MG IV SOLR
INTRAVENOUS | Status: AC
  Filled 2022-11-13: qty 10

## 2022-11-13 MED ORDER — MIDAZOLAM HCL 2 MG/2ML IJ SOLN
INTRAMUSCULAR | Status: AC
  Filled 2022-11-13: qty 2

## 2022-11-13 MED ORDER — SUGAMMADEX SODIUM 200 MG/2ML IV SOLN
INTRAVENOUS | Status: DC | PRN
  Administered 2022-11-13: 200 mg via INTRAVENOUS

## 2022-11-13 MED ORDER — LACTATED RINGERS IV SOLN: INTRAVENOUS | Status: DC | PRN

## 2022-11-13 MED ORDER — CEFAZOLIN SODIUM-DEXTROSE 2-4 GM/100ML-% IV SOLN
INTRAVENOUS | Status: AC
  Filled 2022-11-13: qty 100

## 2022-11-13 MED ORDER — ACETAMINOPHEN 10 MG/ML IV SOLN
INTRAVENOUS | Status: AC
  Filled 2022-11-13: qty 100

## 2022-11-13 MED ORDER — CHLORHEXIDINE GLUCONATE 0.12 % MT SOLN
OROMUCOSAL | Status: AC
  Filled 2022-11-13: qty 15

## 2022-11-13 MED ORDER — FENTANYL CITRATE (PF) 100 MCG/2ML IJ SOLN
INTRAMUSCULAR | Status: DC | PRN
  Administered 2022-11-13 (×2): 50 ug via INTRAVENOUS

## 2022-11-13 SURGICAL SUPPLY — 53 items
ADH SKN CLS APL DERMABOND .7 (GAUZE/BANDAGES/DRESSINGS) ×1
ANCHOR TIS RET SYS 235ML (MISCELLANEOUS) IMPLANT
BAG PRESSURE INF REUSE 1000 (BAG) IMPLANT
BAG TISS RTRVL C235 10X14 (MISCELLANEOUS) ×1
CANNULA REDUCER 12-8 DVNC XI (CANNULA) ×1 IMPLANT
CATH REDDICK CHOLANGI 4FR 50CM (CATHETERS) IMPLANT
CAUTERY HOOK MNPLR 1.6 DVNC XI (INSTRUMENTS) ×1 IMPLANT
CLIP LIGATING HEM O LOK PURPLE (MISCELLANEOUS) IMPLANT
CLIP LIGATING HEMO O LOK GREEN (MISCELLANEOUS) ×1 IMPLANT
DERMABOND ADVANCED .7 DNX12 (GAUZE/BANDAGES/DRESSINGS) ×1 IMPLANT
DRAPE ARM DVNC X/XI (DISPOSABLE) ×4 IMPLANT
DRAPE C-ARM XRAY 36X54 (DRAPES) IMPLANT
DRAPE COLUMN DVNC XI (DISPOSABLE) ×1 IMPLANT
ELECT REM PT RETURN 9FT ADLT (ELECTROSURGICAL) ×1
ELECTRODE REM PT RTRN 9FT ADLT (ELECTROSURGICAL) ×1 IMPLANT
FORCEPS BPLR 8 MD DVNC XI (FORCEP) ×1 IMPLANT
FORCEPS BPLR R/ABLATION 8 DVNC (INSTRUMENTS) ×1 IMPLANT
FORCEPS PROGRASP DVNC XI (FORCEP) ×1 IMPLANT
GLOVE BIO SURGEON STRL SZ 6.5 (GLOVE) ×2 IMPLANT
GLOVE BIOGEL PI IND STRL 6.5 (GLOVE) ×2 IMPLANT
GOWN STRL REUS W/ TWL LRG LVL3 (GOWN DISPOSABLE) ×3 IMPLANT
GOWN STRL REUS W/TWL LRG LVL3 (GOWN DISPOSABLE) ×3
GRASPER SUT TROCAR 14GX15 (MISCELLANEOUS) ×1 IMPLANT
IRRIGATOR SUCT 8 DISP DVNC XI (IRRIGATION / IRRIGATOR) IMPLANT
IV CATH ANGIO 12GX3 LT BLUE (NEEDLE) IMPLANT
IV NS 1000ML (IV SOLUTION)
IV NS 1000ML BAXH (IV SOLUTION) IMPLANT
KIT PINK PAD W/HEAD ARE REST (MISCELLANEOUS) ×1
KIT PINK PAD W/HEAD ARM REST (MISCELLANEOUS) ×1 IMPLANT
LABEL OR SOLS (LABEL) ×1 IMPLANT
MANIFOLD NEPTUNE II (INSTRUMENTS) ×1 IMPLANT
NDL HYPO 22X1.5 SAFETY MO (MISCELLANEOUS) ×1 IMPLANT
NDL INSUFFLATION 14GA 120MM (NEEDLE) ×1 IMPLANT
NEEDLE HYPO 22X1.5 SAFETY MO (MISCELLANEOUS) ×1
NEEDLE INSUFFLATION 14GA 120MM (NEEDLE) ×1
NS IRRIG 500ML POUR BTL (IV SOLUTION) ×1 IMPLANT
OBTURATOR OPTICAL STND 8 DVNC (TROCAR) ×1
OBTURATOR OPTICALSTD 8 DVNC (TROCAR) ×1 IMPLANT
PACK LAP CHOLECYSTECTOMY (MISCELLANEOUS) ×1 IMPLANT
SEAL UNIV 5-12 XI (MISCELLANEOUS) ×4 IMPLANT
SET TUBE SMOKE EVAC HIGH FLOW (TUBING) ×1 IMPLANT
SOL ELECTROSURG ANTI STICK (MISCELLANEOUS) ×1
SOLUTION ELECTROSURG ANTI STCK (MISCELLANEOUS) ×1 IMPLANT
SPIKE FLUID TRANSFER (MISCELLANEOUS) ×2 IMPLANT
SPONGE T-LAP 4X18 ~~LOC~~+RFID (SPONGE) IMPLANT
SUT MNCRL 4-0 (SUTURE) ×1
SUT MNCRL 4-0 27XMFL (SUTURE) ×1
SUT VICRYL 0 UR6 27IN ABS (SUTURE) ×1 IMPLANT
SUTURE MNCRL 4-0 27XMF (SUTURE) ×1 IMPLANT
SYS BAG RETRIEVAL 10MM (BASKET) ×1
SYSTEM BAG RETRIEVAL 10MM (BASKET) ×1 IMPLANT
TRAP FLUID SMOKE EVACUATOR (MISCELLANEOUS) ×1 IMPLANT
WATER STERILE IRR 500ML POUR (IV SOLUTION) ×1 IMPLANT

## 2022-11-13 NOTE — Brief Op Note (Signed)
11/13/2022  11:00 AM  PATIENT:  Nathan Wong  67 y.o. male  PRE-OPERATIVE DIAGNOSIS:  Calculus of gallbladder without cholecystitis without obstruction  K80.20  POST-OPERATIVE DIAGNOSIS:  chronic cholecystitis  PROCEDURE:  Procedure(s): XI ROBOTIC ASSISTED LAPAROSCOPIC CHOLECYSTECTOMY (N/A) INDOCYANINE GREEN FLUORESCENCE IMAGING (ICG) (N/A)  SURGEON:  Surgeons and Role:    * Kandis Cocking, MD - Primary  PHYSICIAN ASSISTANT:   ASSISTANTS: none   ANESTHESIA:   general  EBL:  15 mL   BLOOD ADMINISTERED:none  DRAINS: none   LOCAL MEDICATIONS USED:  MARCAINE    and BUPIVICAINE   SPECIMEN:  Source of Specimen:  gallbladder  DISPOSITION OF SPECIMEN:  PATHOLOGY  COUNTS:  YES  TOURNIQUET:  * No tourniquets in log *  DICTATION: .Dragon Dictation  PLAN OF CARE: Discharge to home after PACU  PATIENT DISPOSITION:  PACU - hemodynamically stable.   Delay start of Pharmacological VTE agent (>24hrs) due to surgical blood loss or risk of bleeding: no

## 2022-11-13 NOTE — Anesthesia Procedure Notes (Signed)
Procedure Name: Intubation Date/Time: 11/13/2022 7:40 AM  Performed by: Katherine Basset, CRNAPre-anesthesia Checklist: Patient identified, Emergency Drugs available, Suction available and Patient being monitored Patient Re-evaluated:Patient Re-evaluated prior to induction Oxygen Delivery Method: Circle system utilized Preoxygenation: Pre-oxygenation with 100% oxygen Induction Type: IV induction Ventilation: Mask ventilation without difficulty Laryngoscope Size: Miller and 2 Grade View: Grade I Tube type: Oral Tube size: 7.5 mm Number of attempts: 1 Airway Equipment and Method: Stylet and Oral airway Placement Confirmation: ETT inserted through vocal cords under direct vision, positive ETCO2 and breath sounds checked- equal and bilateral Secured at: 22 cm Tube secured with: Tape Dental Injury: Teeth and Oropharynx as per pre-operative assessment

## 2022-11-13 NOTE — Transfer of Care (Signed)
Immediate Anesthesia Transfer of Care Note  Patient: Nathan Wong  Procedure(s) Performed: XI ROBOTIC ASSISTED LAPAROSCOPIC CHOLECYSTECTOMY (Abdomen) INDOCYANINE Nathan Wong FLUORESCENCE IMAGING (ICG) (Abdomen)  Patient Location: PACU  Anesthesia Type:General  Level of Consciousness: awake, alert , and oriented  Airway & Oxygen Therapy: Patient Spontanous Breathing and Patient connected to face mask oxygen  Post-op Assessment: Report given to RN, Post -op Vital signs reviewed and stable, and Patient moving all extremities  Post vital signs: Reviewed and stable  Last Vitals:  Vitals Value Taken Time  BP 140/79 11/13/22 1100  Temp    Pulse 79 11/13/22 1104  Resp 14 11/13/22 1104  SpO2 100 % 11/13/22 1104  Vitals shown include unfiled device data.  Last Pain:  Vitals:   11/13/22 0618  TempSrc: Temporal  PainSc: 4          Complications: No notable events documented.

## 2022-11-13 NOTE — H&P (Signed)
Patient here for robotic cholecystectomy. No changes from H and P below. Discussed r/b/a of procedure and he wishes to proceed. All questions answered.      Patient ID: Nathan Wong, male   DOB: 1955-06-18, 67 y.o.   MRN: [id number]  CC: Abdominal pain/symptomatic cholelithiais   History of Present Illness Nathan Wong is a 67 y.o. male who presents to clinic after ED visit for abdominal pain. He states the abdominal pain is located in epigastric and RUQ, describes it as sharp and radiates to back. This was associated with nausea and vomiting when he presented to ED but he has no more episdoes of vomiting. It is worse after eating. Denies jaundice, fevers, chills. .  Past Medical History Past Medical History:  Diagnosis Date   Acute kidney injury (HCC)    Arthritis    Diabetes mellitus without complication (HCC)    Diverticulosis    Essential hypertension    GERD (gastroesophageal reflux disease)    Gouty arthritis    H/O hernia repair    Hypothyroidism    Pneumonia due to COVID-19 virus 07/2019   Symptomatic cholelithiasis 11/2022   Type 2 diabetes mellitus without complication, with long-term current use of insulin [facility])        Past Surgical History:  Procedure Laterality Date   COLONOSCOPY  02/08/2020   UMBILICAL HERNIA REPAIR      No Known Allergies  No current facility-administered medications for this visit.   No current outpatient medications on file.   Facility-Administered Medications Ordered in Other Visits  Medication Dose Route Frequency Provider Last Rate Last Admin   ceFAZolin (ANCEF) IVPB 2g/100 mL premix  2 g Intravenous On Call to OR Kandis Cocking, MD       Chlorhexidine Gluconate Cloth 2 % PADS 6 each  6 each Topical Once Kandis Cocking, MD        Family History Family History  Problem Relation Age of Onset   CAD Mother    Diabetes Sister    Hypertension Sister    Early death Neg Hx       Social History Social History   Tobacco Use    Smoking status: Never   Smokeless tobacco: Never  Vaping Use   Vaping status: Never Used  Substance Use Topics   Alcohol use: No   Drug use: No       ROS Full ROS of systems performed and is otherwise negative there than what is stated in the HPI Social Hx: likes to hunt  Physical Exam   CONSTITUTIONAL: NAD EYES: Pupils equal, round, and reactive to light, Sclera non-icteric. EARS, NOSE, MOUTH AND THROAT: The oropharynx is clear. Hearing is intact to voice.  NECK: Trachea is midline, and there is no jugular venous distension.  RESPIRATORY:  Lungs are clear, and breath sounds are equal bilaterally. Normal respiratory effort without pathologic use of accessory muscles. CARDIOVASCULAR: Heart is regular without murmurs, gallops, or rubs. GI: The abdomen is  soft, nontender, and nondistended. There were no palpable masses. There was no hepatosplenomegaly. There were normal bowel sounds. Umbilical incision well healed, infra-umbilical MUSCULOSKELETAL:  Normal muscle strength and tone in all four extremities.    NEUROLOGIC:  Motor and sensation is grossly normal.  Cranial nerves are grossly intact.  Data Reviewed Labs reviewed: normal total bilirubin, Cr normal and glucose slightly elevated Imaging reviewed, [place] with stones in gallbladder but no wall thickening or edema appreciated  I have personally reviewed the patient's imaging  and medical records.    Assessment    67 year old male with epigastric and right upper quadrant pain after eating and ultrasound showing gallstones consistent with symptomatic cholelithiasis.  Plan    Discussed with patient the nature of symptomatic cholelithiasis and pathophysiology.  Recommended undergoing surgical excision with a robot-assisted  cholecystectomy with ICG.  I discussed risks, benefits and alternatives of the procedure including infection, bleeding, retained stone, bile leak, common bile duct injury.  He understands these risks and  wishes to proceed with surgery.  He should stop his aspirin 5 days prior to surgery.    Kandis Cocking 11/13/2022, 7:00 AM

## 2022-11-13 NOTE — Anesthesia Postprocedure Evaluation (Signed)
Anesthesia Post Note  Patient: Nathan Wong  Procedure(s) Performed: XI ROBOTIC ASSISTED LAPAROSCOPIC CHOLECYSTECTOMY (Abdomen) INDOCYANINE GREEN FLUORESCENCE IMAGING (ICG) (Abdomen)  Patient location during evaluation: PACU Anesthesia Type: General Level of consciousness: awake Pain management: satisfactory to patient Vital Signs Assessment: post-procedure vital signs reviewed and stable Respiratory status: spontaneous breathing Cardiovascular status: blood pressure returned to baseline Anesthetic complications: no   No notable events documented.   Last Vitals:  Vitals:   11/13/22 1215 11/13/22 1232  BP:  (!) 151/84  Pulse: 91 (!) 107  Resp: 13 20  Temp:  (!) 36.1 C  SpO2: 96% 93%    Last Pain:  Vitals:   11/13/22 1235  TempSrc:   PainSc: 3                  VAN STAVEREN,Marlow Berenguer

## 2022-11-13 NOTE — H&P (Signed)
Patient ID: Nathan Wong, male   DOB: February 14, 1955, 67 y.o.   MRN: [id number]  CC: Abdominal pain/symptomatic cholelithiais   History of Present Illness Nathan Wong is a 67 y.o. male who presents to clinic after ED visit for abdominal pain. He states the abdominal pain is located in epigastric and RUQ, describes it as sharp and radiates to back. This was associated with nausea and vomiting when he presented to ED but he has no more episdoes of vomiting. It is worse after eating. Denies jaundice, fevers, chills. .  Past Medical History Past Medical History:  Diagnosis Date   Acute kidney injury (HCC)    Arthritis    Diabetes mellitus without complication (HCC)    Diverticulosis    Essential hypertension    GERD (gastroesophageal reflux disease)    Gouty arthritis    H/O hernia repair    Hypothyroidism    Pneumonia due to COVID-19 virus 07/2019   Symptomatic cholelithiasis 11/2022   Type 2 diabetes mellitus without complication, with long-term current use of insulin [facility])        Past Surgical History:  Procedure Laterality Date   COLONOSCOPY  02/08/2020   UMBILICAL HERNIA REPAIR      No Known Allergies  Current Facility-Administered Medications  Medication Dose Route Frequency Provider Last Rate Last Admin   ceFAZolin (ANCEF) IVPB 2g/100 mL premix  2 g Intravenous On Call to OR Kandis Cocking, MD       Chlorhexidine Gluconate Cloth 2 % PADS 6 each  6 each Topical Once Kandis Cocking, MD       sodium chloride flush (NS) 0.9 % injection 3 mL  3 mL Intravenous Q12H Darleene Cleaver, Gerrit Heck, MD        Family History Family History  Problem Relation Age of Onset   CAD Mother    Diabetes Sister    Hypertension Sister    Early death Neg Hx       Social History Social History   Tobacco Use   Smoking status: Never   Smokeless tobacco: Never  Vaping Use   Vaping status: Never Used  Substance Use Topics   Alcohol use: No   Drug use: No       ROS Full ROS  of systems performed and is otherwise negative there than what is stated in the HPI Social Hx: likes to hunt  Physical Exam   CONSTITUTIONAL: NAD EYES: Pupils equal, round, and reactive to light, Sclera non-icteric. EARS, NOSE, MOUTH AND THROAT: The oropharynx is clear. Hearing is intact to voice.  NECK: Trachea is midline, and there is no jugular venous distension.  RESPIRATORY:  Lungs are clear, and breath sounds are equal bilaterally. Normal respiratory effort without pathologic use of accessory muscles. CARDIOVASCULAR: Heart is regular without murmurs, gallops, or rubs. GI: The abdomen is  soft, nontender, and nondistended. There were no palpable masses. There was no hepatosplenomegaly. There were normal bowel sounds. Umbilical incision well healed, infra-umbilical MUSCULOSKELETAL:  Normal muscle strength and tone in all four extremities.    NEUROLOGIC:  Motor and sensation is grossly normal.  Cranial nerves are grossly intact.  Data Reviewed Labs reviewed: normal total bilirubin, Cr normal and glucose slightly elevated Imaging reviewed, [place] with stones in gallbladder but no wall thickening or edema appreciated  I have personally reviewed the patient's imaging and medical records.    Assessment    67 year old male with epigastric and right upper quadrant pain after eating and ultrasound showing  gallstones consistent with symptomatic cholelithiasis.  Plan    Discussed with patient the nature of symptomatic cholelithiasis and pathophysiology.  Recommended undergoing surgical excision with a robot-assisted  cholecystectomy with ICG.  I discussed risks, benefits and alternatives of the procedure including infection, bleeding, retained stone, bile leak, common bile duct injury.  He understands these risks and wishes to proceed with surgery.  He should stop his aspirin 5 days prior to surgery.    Kandis Cocking 11/13/2022, 7:02 AM

## 2022-11-13 NOTE — Progress Notes (Addendum)
No changes to H and P, proceed as planned.    Patient ID: Nathan Wong, male   DOB: Jun 29, 1955, 67 y.o.   MRN: [id number]  CC: Abdominal pain/symptomatic cholelithiais   History of Present Illness Nathan Wong is a 67 y.o. male who presents to clinic after ED visit for abdominal pain. He states the abdominal pain is located in epigastric and RUQ, describes it as sharp and radiates to back. This was associated with nausea and vomiting when he presented to ED but he has no more episdoes of vomiting. It is worse after eating. Denies jaundice, fevers, chills. .  Past Medical History Past Medical History:  Diagnosis Date   Acute kidney injury (HCC)    Arthritis    Diabetes mellitus without complication (HCC)    Diverticulosis    Essential hypertension    GERD (gastroesophageal reflux disease)    Gouty arthritis    H/O hernia repair    Hypothyroidism    Pneumonia due to COVID-19 virus 07/2019   Symptomatic cholelithiasis 11/2022   Type 2 diabetes mellitus without complication, with long-term current use of insulin [facility])        Past Surgical History:  Procedure Laterality Date   COLONOSCOPY  02/08/2020   UMBILICAL HERNIA REPAIR      No Known Allergies  Current Facility-Administered Medications  Medication Dose Route Frequency Provider Last Rate Last Admin   ceFAZolin (ANCEF) IVPB 2g/100 mL premix  2 g Intravenous On Call to OR Kandis Cocking, MD       Chlorhexidine Gluconate Cloth 2 % PADS 6 each  6 each Topical Once Kandis Cocking, MD       sodium chloride flush (NS) 0.9 % injection 3 mL  3 mL Intravenous Q12H Darleene Cleaver, Gerrit Heck, MD        Family History Family History  Problem Relation Age of Onset   CAD Mother    Diabetes Sister    Hypertension Sister    Early death Neg Hx       Social History Social History   Tobacco Use   Smoking status: Never   Smokeless tobacco: Never  Vaping Use   Vaping status: Never Used  Substance Use Topics   Alcohol  use: No   Drug use: No       ROS Full ROS of systems performed and is otherwise negative there than what is stated in the HPI Social Hx: likes to hunt  Physical Exam   CONSTITUTIONAL: NAD EYES: Pupils equal, round, and reactive to light, Sclera non-icteric. EARS, NOSE, MOUTH AND THROAT: The oropharynx is clear. Hearing is intact to voice.  NECK: Trachea is midline, and there is no jugular venous distension.  RESPIRATORY:  Lungs are clear, and breath sounds are equal bilaterally. Normal respiratory effort without pathologic use of accessory muscles. CARDIOVASCULAR: Heart is regular without murmurs, gallops, or rubs. GI: The abdomen is  soft, nontender, and nondistended. There were no palpable masses. There was no hepatosplenomegaly. There were normal bowel sounds. Umbilical incision well healed, infra-umbilical MUSCULOSKELETAL:  Normal muscle strength and tone in all four extremities.    NEUROLOGIC:  Motor and sensation is grossly normal.  Cranial nerves are grossly intact.  Data Reviewed Labs reviewed: normal total bilirubin, Cr normal and glucose slightly elevated Imaging reviewed, [place] with stones in gallbladder but no wall thickening or edema appreciated  I have personally reviewed the patient's imaging and medical records.    Assessment    67 year old male  with epigastric and right upper quadrant pain after eating and ultrasound showing gallstones consistent with symptomatic cholelithiasis.  Plan    Discussed with patient the nature of symptomatic cholelithiasis and pathophysiology.  Recommended undergoing surgical excision with a robot-assisted  cholecystectomy with ICG.  I discussed risks, benefits and alternatives of the procedure including infection, bleeding, retained stone, bile leak, common bile duct injury.  He understands these risks and wishes to proceed with surgery.  He should stop his aspirin 5 days prior to surgery.    Kandis Cocking 11/13/2022, 7:02 AM

## 2022-11-13 NOTE — Progress Notes (Signed)
Nauseated and vomited small amount bile.  Medicated with Zofran with relief noted from nausea.  Gave 200 ml. IV fluid prior to discharge.

## 2022-11-13 NOTE — Discharge Instructions (Signed)

## 2022-11-13 NOTE — Anesthesia Preprocedure Evaluation (Signed)
Anesthesia Evaluation  Patient identified by MRN, date of birth, ID band Patient awake    Reviewed: Allergy & Precautions, NPO status , Patient's Chart, lab work & pertinent test results  Airway Mallampati: II  TM Distance: >3 FB Neck ROM: full    Dental  (+) Edentulous Upper, Edentulous Lower   Pulmonary neg pulmonary ROS   Pulmonary exam normal breath sounds clear to auscultation       Cardiovascular Exercise Tolerance: Good Pt. on medications negative cardio ROS Normal cardiovascular exam Rhythm:Regular Rate:Normal     Neuro/Psych negative neurological ROS  negative psych ROS   GI/Hepatic negative GI ROS, Neg liver ROS,GERD  Medicated,,  Endo/Other  negative endocrine ROSdiabetes, Well Controlled, Type 1, Insulin DependentHypothyroidism    Renal/GU      Musculoskeletal  (+) Arthritis ,    Abdominal Normal abdominal exam  (+)   Peds negative pediatric ROS (+)  Hematology negative hematology ROS (+)   Anesthesia Other Findings Past Medical History: No date: Acute kidney injury (HCC) No date: Arthritis No date: Diabetes mellitus without complication (HCC) No date: Diverticulosis No date: Essential hypertension No date: GERD (gastroesophageal reflux disease) No date: Gouty arthritis No date: H/O hernia repair No date: Hypothyroidism 07/2019: Pneumonia due to COVID-19 virus 11/2022: Symptomatic cholelithiasis No date: Type 2 diabetes mellitus without complication, with long- term current use of insulin (HCC)  Past Surgical History: 02/08/2020: COLONOSCOPY No date: UMBILICAL HERNIA REPAIR     Reproductive/Obstetrics negative OB ROS                             Anesthesia Physical Anesthesia Plan  ASA: 3  Anesthesia Plan: General   Post-op Pain Management:    Induction: Intravenous  PONV Risk Score and Plan: Ondansetron, Dexamethasone, Midazolam and Treatment may vary  due to age or medical condition  Airway Management Planned: Oral ETT  Additional Equipment:   Intra-op Plan:   Post-operative Plan: Extubation in OR  Informed Consent: I have reviewed the patients History and Physical, chart, labs and discussed the procedure including the risks, benefits and alternatives for the proposed anesthesia with the patient or authorized representative who has indicated his/her understanding and acceptance.     Dental Advisory Given  Plan Discussed with: CRNA and Surgeon  Anesthesia Plan Comments:        Anesthesia Quick Evaluation

## 2022-11-14 DIAGNOSIS — K811 Chronic cholecystitis: Secondary | ICD-10-CM

## 2022-11-14 LAB — SURGICAL PATHOLOGY

## 2022-11-14 NOTE — Op Note (Signed)
PATIENT:  Nathan Wong  67 y.o. male   PRE-OPERATIVE DIAGNOSIS:  Calculus of gallbladder without cholecystitis without obstruction  K80.20   POST-OPERATIVE DIAGNOSIS:  chronic cholecystitis   PROCEDURE:  Procedure(s): XI ROBOTIC ASSISTED LAPAROSCOPIC CHOLECYSTECTOMY (N/A) INDOCYANINE GREEN FLUORESCENCE IMAGING (ICG) (N/A)   SURGEON:  Surgeons and Role:    * Kandis Cocking, MD - Primary   PHYSICIAN ASSISTANT:    ASSISTANTS: none    ANESTHESIA:   general   EBL:  15 mL    BLOOD ADMINISTERED:none   DRAINS: none    LOCAL MEDICATIONS USED:  MARCAINE    and BUPIVICAINE    SPECIMEN:  Source of Specimen:  gallbladder   DISPOSITION OF SPECIMEN:  PATHOLOGY   COUNTS:  YES   TOURNIQUET:  * No tourniquets in log *   DICTATION: .Dragon Dictation   PLAN OF CARE: Discharge to home after PACU   PATIENT DISPOSITION:  PACU - hemodynamically stable.  After informed consent was obtained the patient was brought to the operating room placed supine on the operating table.  General endotracheal anesthesia was induced and he was then prepped and draped in usual sterile fashion.  A surgical timeout was called identifying correct patient, site, side and procedure.  A stab incision was made at in the left upper quadrant and the Veress needle was inserted into the peritoneum using standard drop technique.  Pneumoperitoneum was then established.  An 8 mm Optiview robotic trocar was placed just inferior to the umbilicus.  A right paramedian and right lower quadrant 8 mm robotic trocars were then placed under direct visualization and a left paramedian a millimeter robotic trocar was also placed under direct visualization.  The robotic platform was then docked.  Omental attachments to the superior gallbladder wall were taken down with cautery.  The gallbladder was grasped and lifted over the liver.  The gallbladder appeared chronically flamed.  The surrounding peritoneum was quite dense and fibrotic.  The  lateral peritoneum was taken down allowing for the infundibulum to be retracted laterally.  The dense peritoneum over close triangle was taken down and the medial peritoneal attachments on the hepatic plate were then taken down about halfway up the gallbladder using electrocautery.  We cleared close triangle to identify our cystic artery.  Given that the overlying peritoneum was quite dense I did elect to first take the cystic artery.  This was taken high as it entered directly into the gallbladder wall.  It was clipped proximally and distally and ligated in between.  This opened up close trying all to allow me to better visualize the cystic duct.  A window posterior to the cystic duct was made where I could see the hepatic plate behind this.  I then retracted the gallbladder medially and was able to confirm our critical view of safety for the cystic duct.  I used ICG to confirm that we were well away from the common bile duct.  Unfortunately there was no fluorescence of the cystic duct nor the gallbladder wall secondary to chronic obstruction of the cystic duct.  The cystic duct was then clipped twice proximally and distally once with Hem-o-lok clips and ligated in between.  Given the degree of inflammation around the gallbladder consistent with: Chronic cholecystitis there was some spillage of hydrops fluid from the gallbladder.  The gallbladder was then taken off of the hepatic plate with electrocautery.  There was a posterior branch high on the gallbladder that was clipped with Hem-o-lok clips.  The  gallbladder was then placed in an Endo Catch bag.  Hemostasis on the hepatic plate was confirmed and the right upper quadrant was copiously irrigated with 1 L saline solution.  The infraumbilical 8 mm port was then upsized to a 12 mm port.  The gallbladder was then extracted from this port.  The fascia of the 12 mm port was then closed with 0 Vicryl on a PMI.  The preperitoneal space was then infiltrated with  bupivacaine and Marcaine solution.  The abdomen was then desufflated and the skin was closed with 4-0 Monocryl.  It was then dressed with surgical glue.  Prior to termination of the procedure all sponge and instrument counts were correct x 2.

## 2022-11-27 ENCOUNTER — Encounter: Payer: Self-pay | Admitting: Physician Assistant

## 2022-11-27 ENCOUNTER — Ambulatory Visit (INDEPENDENT_AMBULATORY_CARE_PROVIDER_SITE_OTHER): Payer: Medicare HMO | Admitting: Physician Assistant

## 2022-11-27 VITALS — BP 124/81 | HR 82 | Temp 98.2°F | Ht 68.0 in | Wt 171.0 lb

## 2022-11-27 DIAGNOSIS — K8010 Calculus of gallbladder with chronic cholecystitis without obstruction: Secondary | ICD-10-CM

## 2022-11-27 DIAGNOSIS — Z09 Encounter for follow-up examination after completed treatment for conditions other than malignant neoplasm: Secondary | ICD-10-CM

## 2022-11-27 DIAGNOSIS — K8020 Calculus of gallbladder without cholecystitis without obstruction: Secondary | ICD-10-CM

## 2022-11-27 NOTE — Patient Instructions (Signed)

## 2022-11-27 NOTE — Progress Notes (Signed)
[facility] POST-OP OFFICE VISIT  11/27/2022  HPI: Nathan Wong is a 67 y.o. male 14 days s/p robotic assisted laparoscopic cholecystectomy for chronic cholecystitis with Dr Maurine Minister   He has done very well Soreness for the first 2-4 days; now pain free  No fever, chills, nausea, emesis He is tolerating PO without issue; no diarrhea Incisions healing well Ambulating without issue No other complaints   Vital signs: BP 124/81   Pulse 82   Temp 98.2 F (36.8 C)   Ht 5\' 8"  (1.727 m)   Wt 171 lb (77.6 kg)   SpO2 99%   BMI 26.00 kg/m    Physical Exam: Constitutional: Well appearing male, NAD Abdomen: Soft, non-tender, non-distended, no rebound/guarding Skin: Laparoscopic incisions are healing well, healing ecchymosis, no erythema or drainage   Assessment/Plan: This is a 67 y.o. male 14 days s/p robotic assisted laparoscopic cholecystectomy for chronic cholecystitis with Dr Maurine Minister    - Pain control prn  - Reviewed wound care recommendation  - Reviewed lifting restrictions; 4 weeks total  - Reviewed surgical pathology; CCC with early changes of acute cholecystitis  - He can follow up on as needed basis; He understands to call with questions/concerns  -- Lynden Oxford, PA-C [facility] 11/27/2022, 3:02 PM M-F: 7am - 4pm

## 2023-02-24 DIAGNOSIS — E1140 Type 2 diabetes mellitus with diabetic neuropathy, unspecified: Secondary | ICD-10-CM | POA: Diagnosis not present

## 2023-02-24 DIAGNOSIS — Z794 Long term (current) use of insulin: Secondary | ICD-10-CM | POA: Diagnosis not present

## 2023-02-24 DIAGNOSIS — I10 Essential (primary) hypertension: Secondary | ICD-10-CM | POA: Diagnosis not present

## 2023-02-24 DIAGNOSIS — E039 Hypothyroidism, unspecified: Secondary | ICD-10-CM | POA: Diagnosis not present

## 2023-08-26 DIAGNOSIS — Z794 Long term (current) use of insulin: Secondary | ICD-10-CM | POA: Diagnosis not present

## 2023-08-26 DIAGNOSIS — Z125 Encounter for screening for malignant neoplasm of prostate: Secondary | ICD-10-CM | POA: Diagnosis not present

## 2023-08-26 DIAGNOSIS — Z1331 Encounter for screening for depression: Secondary | ICD-10-CM | POA: Diagnosis not present

## 2023-08-26 DIAGNOSIS — E039 Hypothyroidism, unspecified: Secondary | ICD-10-CM | POA: Diagnosis not present

## 2023-08-26 DIAGNOSIS — E1140 Type 2 diabetes mellitus with diabetic neuropathy, unspecified: Secondary | ICD-10-CM | POA: Diagnosis not present

## 2023-08-26 DIAGNOSIS — Z0000 Encounter for general adult medical examination without abnormal findings: Secondary | ICD-10-CM | POA: Diagnosis not present

## 2023-08-26 DIAGNOSIS — E119 Type 2 diabetes mellitus without complications: Secondary | ICD-10-CM | POA: Diagnosis not present

## 2023-08-26 DIAGNOSIS — I10 Essential (primary) hypertension: Secondary | ICD-10-CM | POA: Diagnosis not present

## 2023-10-15 DIAGNOSIS — I10 Essential (primary) hypertension: Secondary | ICD-10-CM | POA: Diagnosis not present

## 2023-10-15 DIAGNOSIS — E119 Type 2 diabetes mellitus without complications: Secondary | ICD-10-CM | POA: Diagnosis not present
